# Patient Record
Sex: Female | Born: 1935 | Race: White | Hispanic: No | Smoking: Never smoker
Health system: Southern US, Community
[De-identification: ages and names within clinical notes are randomized; demographics above are authoritative.]

## PROBLEM LIST (undated history)

## (undated) DIAGNOSIS — I639 Cerebral infarction, unspecified: Secondary | ICD-10-CM

## (undated) DIAGNOSIS — M1990 Unspecified osteoarthritis, unspecified site: Secondary | ICD-10-CM

## (undated) DIAGNOSIS — Z87442 Personal history of urinary calculi: Secondary | ICD-10-CM

## (undated) HISTORY — PX: ABDOMINAL HYSTERECTOMY: SHX81

## (undated) HISTORY — PX: CHOLECYSTECTOMY: SHX55

---

## 2004-01-30 ENCOUNTER — Ambulatory Visit: Payer: Self-pay | Admitting: Family Medicine

## 2004-04-08 ENCOUNTER — Ambulatory Visit: Payer: Self-pay | Admitting: Family Medicine

## 2004-06-03 ENCOUNTER — Encounter: Admission: RE | Admit: 2004-06-03 | Discharge: 2004-06-03 | Payer: Self-pay | Admitting: Neurosurgery

## 2004-06-18 ENCOUNTER — Encounter: Admission: RE | Admit: 2004-06-18 | Discharge: 2004-06-18 | Payer: Self-pay | Admitting: Neurosurgery

## 2004-07-21 ENCOUNTER — Encounter: Admission: RE | Admit: 2004-07-21 | Discharge: 2004-07-21 | Payer: Self-pay | Admitting: Neurosurgery

## 2005-01-26 ENCOUNTER — Ambulatory Visit: Payer: Self-pay | Admitting: Family Medicine

## 2005-04-26 ENCOUNTER — Ambulatory Visit: Payer: Self-pay | Admitting: Family Medicine

## 2014-01-03 DIAGNOSIS — M79604 Pain in right leg: Secondary | ICD-10-CM | POA: Insufficient documentation

## 2014-03-24 DIAGNOSIS — M7138 Other bursal cyst, other site: Secondary | ICD-10-CM | POA: Diagnosis not present

## 2014-03-24 DIAGNOSIS — M4696 Unspecified inflammatory spondylopathy, lumbar region: Secondary | ICD-10-CM | POA: Diagnosis not present

## 2014-03-24 DIAGNOSIS — M419 Scoliosis, unspecified: Secondary | ICD-10-CM | POA: Diagnosis not present

## 2014-03-24 DIAGNOSIS — M79604 Pain in right leg: Secondary | ICD-10-CM | POA: Diagnosis not present

## 2014-03-31 DIAGNOSIS — D3131 Benign neoplasm of right choroid: Secondary | ICD-10-CM | POA: Diagnosis not present

## 2014-03-31 DIAGNOSIS — H40053 Ocular hypertension, bilateral: Secondary | ICD-10-CM | POA: Diagnosis not present

## 2014-03-31 DIAGNOSIS — H524 Presbyopia: Secondary | ICD-10-CM | POA: Diagnosis not present

## 2014-03-31 DIAGNOSIS — H40003 Preglaucoma, unspecified, bilateral: Secondary | ICD-10-CM | POA: Diagnosis not present

## 2014-04-16 DIAGNOSIS — Z6825 Body mass index (BMI) 25.0-25.9, adult: Secondary | ICD-10-CM | POA: Diagnosis not present

## 2014-04-16 DIAGNOSIS — Z0000 Encounter for general adult medical examination without abnormal findings: Secondary | ICD-10-CM | POA: Diagnosis not present

## 2014-04-18 DIAGNOSIS — Z1231 Encounter for screening mammogram for malignant neoplasm of breast: Secondary | ICD-10-CM | POA: Diagnosis not present

## 2014-04-18 DIAGNOSIS — Z13820 Encounter for screening for osteoporosis: Secondary | ICD-10-CM | POA: Diagnosis not present

## 2014-04-18 DIAGNOSIS — M8580 Other specified disorders of bone density and structure, unspecified site: Secondary | ICD-10-CM | POA: Diagnosis not present

## 2014-04-18 DIAGNOSIS — M810 Age-related osteoporosis without current pathological fracture: Secondary | ICD-10-CM | POA: Diagnosis not present

## 2014-04-28 DIAGNOSIS — Z1211 Encounter for screening for malignant neoplasm of colon: Secondary | ICD-10-CM | POA: Diagnosis not present

## 2014-06-05 DIAGNOSIS — M47817 Spondylosis without myelopathy or radiculopathy, lumbosacral region: Secondary | ICD-10-CM | POA: Diagnosis not present

## 2014-06-05 DIAGNOSIS — M4696 Unspecified inflammatory spondylopathy, lumbar region: Secondary | ICD-10-CM | POA: Diagnosis not present

## 2014-06-30 DIAGNOSIS — M4696 Unspecified inflammatory spondylopathy, lumbar region: Secondary | ICD-10-CM | POA: Diagnosis not present

## 2014-06-30 DIAGNOSIS — M4125 Other idiopathic scoliosis, thoracolumbar region: Secondary | ICD-10-CM | POA: Diagnosis not present

## 2014-06-30 DIAGNOSIS — M48062 Spinal stenosis, lumbar region with neurogenic claudication: Secondary | ICD-10-CM | POA: Insufficient documentation

## 2014-06-30 DIAGNOSIS — M4316 Spondylolisthesis, lumbar region: Secondary | ICD-10-CM | POA: Diagnosis not present

## 2014-06-30 DIAGNOSIS — M4806 Spinal stenosis, lumbar region: Secondary | ICD-10-CM | POA: Diagnosis not present

## 2014-09-29 DIAGNOSIS — Z6825 Body mass index (BMI) 25.0-25.9, adult: Secondary | ICD-10-CM | POA: Diagnosis not present

## 2014-09-29 DIAGNOSIS — J040 Acute laryngitis: Secondary | ICD-10-CM | POA: Diagnosis not present

## 2014-10-13 DIAGNOSIS — H4011X2 Primary open-angle glaucoma, moderate stage: Secondary | ICD-10-CM | POA: Diagnosis not present

## 2014-10-15 DIAGNOSIS — I10 Essential (primary) hypertension: Secondary | ICD-10-CM | POA: Diagnosis not present

## 2014-10-15 DIAGNOSIS — I739 Peripheral vascular disease, unspecified: Secondary | ICD-10-CM | POA: Diagnosis not present

## 2014-10-15 DIAGNOSIS — D62 Acute posthemorrhagic anemia: Secondary | ICD-10-CM | POA: Diagnosis not present

## 2014-10-15 DIAGNOSIS — N183 Chronic kidney disease, stage 3 (moderate): Secondary | ICD-10-CM | POA: Diagnosis not present

## 2014-10-15 DIAGNOSIS — E785 Hyperlipidemia, unspecified: Secondary | ICD-10-CM | POA: Diagnosis not present

## 2014-10-15 DIAGNOSIS — E039 Hypothyroidism, unspecified: Secondary | ICD-10-CM | POA: Diagnosis not present

## 2014-10-15 DIAGNOSIS — I252 Old myocardial infarction: Secondary | ICD-10-CM | POA: Diagnosis not present

## 2014-10-23 DIAGNOSIS — Z1211 Encounter for screening for malignant neoplasm of colon: Secondary | ICD-10-CM | POA: Diagnosis not present

## 2014-11-13 DIAGNOSIS — Z1211 Encounter for screening for malignant neoplasm of colon: Secondary | ICD-10-CM | POA: Diagnosis not present

## 2014-12-23 DIAGNOSIS — Z23 Encounter for immunization: Secondary | ICD-10-CM | POA: Diagnosis not present

## 2015-01-06 DIAGNOSIS — M4806 Spinal stenosis, lumbar region: Secondary | ICD-10-CM | POA: Diagnosis not present

## 2015-01-06 DIAGNOSIS — M5136 Other intervertebral disc degeneration, lumbar region: Secondary | ICD-10-CM | POA: Diagnosis not present

## 2015-01-06 DIAGNOSIS — M4150 Other secondary scoliosis, site unspecified: Secondary | ICD-10-CM | POA: Insufficient documentation

## 2015-01-06 DIAGNOSIS — M4180 Other forms of scoliosis, site unspecified: Secondary | ICD-10-CM

## 2015-01-06 DIAGNOSIS — M7138 Other bursal cyst, other site: Secondary | ICD-10-CM | POA: Diagnosis not present

## 2015-01-06 DIAGNOSIS — M4316 Spondylolisthesis, lumbar region: Secondary | ICD-10-CM | POA: Diagnosis not present

## 2015-01-06 HISTORY — DX: Other secondary scoliosis, site unspecified: M41.50

## 2015-01-06 HISTORY — DX: Other forms of scoliosis, site unspecified: M41.80

## 2015-01-19 DIAGNOSIS — M4856XA Collapsed vertebra, not elsewhere classified, lumbar region, initial encounter for fracture: Secondary | ICD-10-CM | POA: Diagnosis not present

## 2015-01-19 DIAGNOSIS — M5146 Schmorl's nodes, lumbar region: Secondary | ICD-10-CM | POA: Diagnosis not present

## 2015-01-19 DIAGNOSIS — E079 Disorder of thyroid, unspecified: Secondary | ICD-10-CM | POA: Diagnosis not present

## 2015-01-19 DIAGNOSIS — Z79899 Other long term (current) drug therapy: Secondary | ICD-10-CM | POA: Diagnosis not present

## 2015-01-19 DIAGNOSIS — I10 Essential (primary) hypertension: Secondary | ICD-10-CM | POA: Diagnosis not present

## 2015-01-19 DIAGNOSIS — M5124 Other intervertebral disc displacement, thoracic region: Secondary | ICD-10-CM | POA: Diagnosis not present

## 2015-01-19 DIAGNOSIS — M5136 Other intervertebral disc degeneration, lumbar region: Secondary | ICD-10-CM | POA: Diagnosis not present

## 2015-01-19 DIAGNOSIS — M4316 Spondylolisthesis, lumbar region: Secondary | ICD-10-CM | POA: Diagnosis not present

## 2015-01-19 DIAGNOSIS — M4126 Other idiopathic scoliosis, lumbar region: Secondary | ICD-10-CM | POA: Diagnosis not present

## 2015-01-19 DIAGNOSIS — M4806 Spinal stenosis, lumbar region: Secondary | ICD-10-CM | POA: Diagnosis not present

## 2015-01-19 DIAGNOSIS — M8580 Other specified disorders of bone density and structure, unspecified site: Secondary | ICD-10-CM | POA: Diagnosis not present

## 2015-01-19 DIAGNOSIS — M5126 Other intervertebral disc displacement, lumbar region: Secondary | ICD-10-CM | POA: Diagnosis not present

## 2015-02-02 DIAGNOSIS — H401132 Primary open-angle glaucoma, bilateral, moderate stage: Secondary | ICD-10-CM | POA: Diagnosis not present

## 2015-02-17 DIAGNOSIS — M4316 Spondylolisthesis, lumbar region: Secondary | ICD-10-CM | POA: Diagnosis not present

## 2015-02-17 DIAGNOSIS — M5136 Other intervertebral disc degeneration, lumbar region: Secondary | ICD-10-CM | POA: Diagnosis not present

## 2015-02-17 DIAGNOSIS — M4696 Unspecified inflammatory spondylopathy, lumbar region: Secondary | ICD-10-CM | POA: Diagnosis not present

## 2015-02-18 DIAGNOSIS — I10 Essential (primary) hypertension: Secondary | ICD-10-CM | POA: Diagnosis not present

## 2015-02-18 DIAGNOSIS — E785 Hyperlipidemia, unspecified: Secondary | ICD-10-CM | POA: Diagnosis not present

## 2015-02-18 DIAGNOSIS — E039 Hypothyroidism, unspecified: Secondary | ICD-10-CM | POA: Diagnosis not present

## 2015-02-18 DIAGNOSIS — E539 Vitamin B deficiency, unspecified: Secondary | ICD-10-CM | POA: Diagnosis not present

## 2015-02-18 DIAGNOSIS — Z6826 Body mass index (BMI) 26.0-26.9, adult: Secondary | ICD-10-CM | POA: Diagnosis not present

## 2015-02-27 DIAGNOSIS — B1910 Unspecified viral hepatitis B without hepatic coma: Secondary | ICD-10-CM | POA: Diagnosis not present

## 2015-02-27 DIAGNOSIS — Z6826 Body mass index (BMI) 26.0-26.9, adult: Secondary | ICD-10-CM | POA: Diagnosis not present

## 2015-03-02 DIAGNOSIS — H40053 Ocular hypertension, bilateral: Secondary | ICD-10-CM | POA: Diagnosis not present

## 2015-03-27 DIAGNOSIS — M47817 Spondylosis without myelopathy or radiculopathy, lumbosacral region: Secondary | ICD-10-CM | POA: Diagnosis not present

## 2015-03-27 DIAGNOSIS — M4316 Spondylolisthesis, lumbar region: Secondary | ICD-10-CM | POA: Diagnosis not present

## 2015-03-27 DIAGNOSIS — M5136 Other intervertebral disc degeneration, lumbar region: Secondary | ICD-10-CM | POA: Diagnosis not present

## 2015-04-16 DIAGNOSIS — M47817 Spondylosis without myelopathy or radiculopathy, lumbosacral region: Secondary | ICD-10-CM | POA: Diagnosis not present

## 2015-04-16 DIAGNOSIS — M5136 Other intervertebral disc degeneration, lumbar region: Secondary | ICD-10-CM | POA: Diagnosis not present

## 2015-04-16 DIAGNOSIS — M4316 Spondylolisthesis, lumbar region: Secondary | ICD-10-CM | POA: Diagnosis not present

## 2015-04-24 DIAGNOSIS — I10 Essential (primary) hypertension: Secondary | ICD-10-CM | POA: Diagnosis not present

## 2015-05-12 DIAGNOSIS — M7061 Trochanteric bursitis, right hip: Secondary | ICD-10-CM | POA: Diagnosis not present

## 2015-05-26 DIAGNOSIS — Z6825 Body mass index (BMI) 25.0-25.9, adult: Secondary | ICD-10-CM | POA: Diagnosis not present

## 2015-05-26 DIAGNOSIS — M7061 Trochanteric bursitis, right hip: Secondary | ICD-10-CM | POA: Diagnosis not present

## 2015-05-26 DIAGNOSIS — Z1231 Encounter for screening mammogram for malignant neoplasm of breast: Secondary | ICD-10-CM | POA: Diagnosis not present

## 2015-05-26 DIAGNOSIS — M5137 Other intervertebral disc degeneration, lumbosacral region: Secondary | ICD-10-CM | POA: Diagnosis not present

## 2015-06-01 DIAGNOSIS — M4696 Unspecified inflammatory spondylopathy, lumbar region: Secondary | ICD-10-CM | POA: Diagnosis not present

## 2015-06-01 DIAGNOSIS — M5136 Other intervertebral disc degeneration, lumbar region: Secondary | ICD-10-CM | POA: Diagnosis not present

## 2015-06-16 DIAGNOSIS — M47817 Spondylosis without myelopathy or radiculopathy, lumbosacral region: Secondary | ICD-10-CM | POA: Diagnosis not present

## 2015-06-29 DIAGNOSIS — S43102A Unspecified dislocation of left acromioclavicular joint, initial encounter: Secondary | ICD-10-CM | POA: Diagnosis not present

## 2015-06-29 DIAGNOSIS — S43015A Anterior dislocation of left humerus, initial encounter: Secondary | ICD-10-CM | POA: Diagnosis not present

## 2015-06-29 DIAGNOSIS — S43005A Unspecified dislocation of left shoulder joint, initial encounter: Secondary | ICD-10-CM | POA: Diagnosis not present

## 2015-07-02 DIAGNOSIS — S43005A Unspecified dislocation of left shoulder joint, initial encounter: Secondary | ICD-10-CM | POA: Diagnosis not present

## 2015-07-21 DIAGNOSIS — H524 Presbyopia: Secondary | ICD-10-CM | POA: Diagnosis not present

## 2015-07-21 DIAGNOSIS — H401132 Primary open-angle glaucoma, bilateral, moderate stage: Secondary | ICD-10-CM | POA: Diagnosis not present

## 2015-07-21 DIAGNOSIS — H26493 Other secondary cataract, bilateral: Secondary | ICD-10-CM | POA: Diagnosis not present

## 2015-08-13 DIAGNOSIS — E038 Other specified hypothyroidism: Secondary | ICD-10-CM | POA: Diagnosis not present

## 2015-08-13 DIAGNOSIS — I10 Essential (primary) hypertension: Secondary | ICD-10-CM | POA: Diagnosis not present

## 2015-08-13 DIAGNOSIS — E785 Hyperlipidemia, unspecified: Secondary | ICD-10-CM | POA: Diagnosis not present

## 2015-08-13 DIAGNOSIS — M5137 Other intervertebral disc degeneration, lumbosacral region: Secondary | ICD-10-CM | POA: Diagnosis not present

## 2015-08-13 DIAGNOSIS — M810 Age-related osteoporosis without current pathological fracture: Secondary | ICD-10-CM | POA: Diagnosis not present

## 2015-08-14 DIAGNOSIS — H401133 Primary open-angle glaucoma, bilateral, severe stage: Secondary | ICD-10-CM | POA: Diagnosis not present

## 2015-08-14 DIAGNOSIS — H26492 Other secondary cataract, left eye: Secondary | ICD-10-CM | POA: Diagnosis not present

## 2015-08-14 DIAGNOSIS — H26493 Other secondary cataract, bilateral: Secondary | ICD-10-CM | POA: Diagnosis not present

## 2015-08-14 DIAGNOSIS — H4010X0 Unspecified open-angle glaucoma, stage unspecified: Secondary | ICD-10-CM | POA: Diagnosis not present

## 2015-10-07 DIAGNOSIS — H33301 Unspecified retinal break, right eye: Secondary | ICD-10-CM | POA: Diagnosis not present

## 2015-10-07 DIAGNOSIS — D3131 Benign neoplasm of right choroid: Secondary | ICD-10-CM | POA: Diagnosis not present

## 2015-10-07 DIAGNOSIS — H401133 Primary open-angle glaucoma, bilateral, severe stage: Secondary | ICD-10-CM | POA: Diagnosis not present

## 2015-11-11 DIAGNOSIS — H401133 Primary open-angle glaucoma, bilateral, severe stage: Secondary | ICD-10-CM | POA: Diagnosis not present

## 2015-11-18 DIAGNOSIS — H401133 Primary open-angle glaucoma, bilateral, severe stage: Secondary | ICD-10-CM | POA: Diagnosis not present

## 2015-11-19 DIAGNOSIS — D3131 Benign neoplasm of right choroid: Secondary | ICD-10-CM | POA: Diagnosis not present

## 2016-01-11 DIAGNOSIS — Z23 Encounter for immunization: Secondary | ICD-10-CM | POA: Diagnosis not present

## 2016-02-10 DIAGNOSIS — H52202 Unspecified astigmatism, left eye: Secondary | ICD-10-CM | POA: Diagnosis not present

## 2016-02-10 DIAGNOSIS — H52201 Unspecified astigmatism, right eye: Secondary | ICD-10-CM | POA: Diagnosis not present

## 2016-02-10 DIAGNOSIS — H5211 Myopia, right eye: Secondary | ICD-10-CM | POA: Diagnosis not present

## 2016-02-10 DIAGNOSIS — H5212 Myopia, left eye: Secondary | ICD-10-CM | POA: Diagnosis not present

## 2016-02-15 DIAGNOSIS — I10 Essential (primary) hypertension: Secondary | ICD-10-CM | POA: Diagnosis not present

## 2016-02-15 DIAGNOSIS — E039 Hypothyroidism, unspecified: Secondary | ICD-10-CM | POA: Diagnosis not present

## 2016-02-15 DIAGNOSIS — H4010X0 Unspecified open-angle glaucoma, stage unspecified: Secondary | ICD-10-CM | POA: Diagnosis not present

## 2016-02-15 DIAGNOSIS — M5137 Other intervertebral disc degeneration, lumbosacral region: Secondary | ICD-10-CM | POA: Diagnosis not present

## 2016-02-15 DIAGNOSIS — E785 Hyperlipidemia, unspecified: Secondary | ICD-10-CM | POA: Diagnosis not present

## 2016-02-15 DIAGNOSIS — M810 Age-related osteoporosis without current pathological fracture: Secondary | ICD-10-CM | POA: Diagnosis not present

## 2016-07-27 DIAGNOSIS — Z09 Encounter for follow-up examination after completed treatment for conditions other than malignant neoplasm: Secondary | ICD-10-CM | POA: Diagnosis not present

## 2016-07-27 DIAGNOSIS — H401133 Primary open-angle glaucoma, bilateral, severe stage: Secondary | ICD-10-CM | POA: Diagnosis not present

## 2016-08-08 DIAGNOSIS — I739 Peripheral vascular disease, unspecified: Secondary | ICD-10-CM | POA: Diagnosis not present

## 2016-08-08 DIAGNOSIS — E782 Mixed hyperlipidemia: Secondary | ICD-10-CM | POA: Diagnosis not present

## 2016-08-08 DIAGNOSIS — I10 Essential (primary) hypertension: Secondary | ICD-10-CM | POA: Diagnosis not present

## 2016-08-08 DIAGNOSIS — H4010X0 Unspecified open-angle glaucoma, stage unspecified: Secondary | ICD-10-CM | POA: Diagnosis not present

## 2016-08-08 DIAGNOSIS — E038 Other specified hypothyroidism: Secondary | ICD-10-CM | POA: Diagnosis not present

## 2016-08-26 ENCOUNTER — Other Ambulatory Visit: Payer: Self-pay | Admitting: *Deleted

## 2016-08-26 DIAGNOSIS — I739 Peripheral vascular disease, unspecified: Secondary | ICD-10-CM

## 2016-09-15 DIAGNOSIS — Z01818 Encounter for other preprocedural examination: Secondary | ICD-10-CM | POA: Diagnosis not present

## 2016-09-15 DIAGNOSIS — H401133 Primary open-angle glaucoma, bilateral, severe stage: Secondary | ICD-10-CM | POA: Diagnosis not present

## 2016-10-05 DIAGNOSIS — H401113 Primary open-angle glaucoma, right eye, severe stage: Secondary | ICD-10-CM | POA: Diagnosis not present

## 2016-10-05 DIAGNOSIS — H5941 Inflammation (infection) of postprocedural bleb, stage 1: Secondary | ICD-10-CM | POA: Diagnosis not present

## 2016-10-05 DIAGNOSIS — H578 Other specified disorders of eye and adnexa: Secondary | ICD-10-CM | POA: Diagnosis not present

## 2016-10-21 ENCOUNTER — Encounter (HOSPITAL_COMMUNITY): Payer: Self-pay

## 2016-10-21 ENCOUNTER — Encounter: Payer: Self-pay | Admitting: Vascular Surgery

## 2016-11-09 DIAGNOSIS — H401123 Primary open-angle glaucoma, left eye, severe stage: Secondary | ICD-10-CM | POA: Diagnosis not present

## 2016-11-09 DIAGNOSIS — H409 Unspecified glaucoma: Secondary | ICD-10-CM | POA: Diagnosis not present

## 2016-12-09 ENCOUNTER — Encounter (HOSPITAL_COMMUNITY): Payer: Self-pay

## 2016-12-09 ENCOUNTER — Encounter: Payer: Self-pay | Admitting: Vascular Surgery

## 2017-01-20 ENCOUNTER — Encounter (HOSPITAL_COMMUNITY): Payer: Self-pay

## 2017-01-20 ENCOUNTER — Encounter: Payer: Self-pay | Admitting: Vascular Surgery

## 2017-02-17 DIAGNOSIS — H401133 Primary open-angle glaucoma, bilateral, severe stage: Secondary | ICD-10-CM | POA: Diagnosis not present

## 2017-02-20 DIAGNOSIS — I739 Peripheral vascular disease, unspecified: Secondary | ICD-10-CM | POA: Diagnosis not present

## 2017-02-20 DIAGNOSIS — I10 Essential (primary) hypertension: Secondary | ICD-10-CM | POA: Insufficient documentation

## 2017-02-20 DIAGNOSIS — R0989 Other specified symptoms and signs involving the circulatory and respiratory systems: Secondary | ICD-10-CM | POA: Insufficient documentation

## 2017-02-20 DIAGNOSIS — H409 Unspecified glaucoma: Secondary | ICD-10-CM

## 2017-02-20 HISTORY — DX: Essential (primary) hypertension: I10

## 2017-02-20 HISTORY — DX: Unspecified glaucoma: H40.9

## 2017-04-10 DIAGNOSIS — J0100 Acute maxillary sinusitis, unspecified: Secondary | ICD-10-CM | POA: Diagnosis not present

## 2017-04-25 DIAGNOSIS — I10 Essential (primary) hypertension: Secondary | ICD-10-CM | POA: Diagnosis not present

## 2017-04-25 DIAGNOSIS — I739 Peripheral vascular disease, unspecified: Secondary | ICD-10-CM | POA: Diagnosis not present

## 2017-04-25 DIAGNOSIS — E038 Other specified hypothyroidism: Secondary | ICD-10-CM | POA: Diagnosis not present

## 2017-04-25 DIAGNOSIS — E782 Mixed hyperlipidemia: Secondary | ICD-10-CM | POA: Diagnosis not present

## 2017-04-25 DIAGNOSIS — H4010X0 Unspecified open-angle glaucoma, stage unspecified: Secondary | ICD-10-CM | POA: Diagnosis not present

## 2017-05-19 DIAGNOSIS — H401133 Primary open-angle glaucoma, bilateral, severe stage: Secondary | ICD-10-CM | POA: Diagnosis not present

## 2017-05-19 DIAGNOSIS — H401122 Primary open-angle glaucoma, left eye, moderate stage: Secondary | ICD-10-CM | POA: Diagnosis not present

## 2017-05-19 DIAGNOSIS — H04122 Dry eye syndrome of left lacrimal gland: Secondary | ICD-10-CM | POA: Diagnosis not present

## 2017-05-19 DIAGNOSIS — H04121 Dry eye syndrome of right lacrimal gland: Secondary | ICD-10-CM | POA: Diagnosis not present

## 2017-06-30 DIAGNOSIS — I6350 Cerebral infarction due to unspecified occlusion or stenosis of unspecified cerebral artery: Secondary | ICD-10-CM | POA: Diagnosis not present

## 2017-06-30 DIAGNOSIS — R29810 Facial weakness: Secondary | ICD-10-CM | POA: Diagnosis not present

## 2017-06-30 DIAGNOSIS — I639 Cerebral infarction, unspecified: Secondary | ICD-10-CM | POA: Diagnosis not present

## 2017-06-30 DIAGNOSIS — I6521 Occlusion and stenosis of right carotid artery: Secondary | ICD-10-CM | POA: Diagnosis not present

## 2017-06-30 DIAGNOSIS — G8324 Monoplegia of upper limb affecting left nondominant side: Secondary | ICD-10-CM | POA: Diagnosis not present

## 2017-06-30 DIAGNOSIS — R29701 NIHSS score 1: Secondary | ICD-10-CM | POA: Diagnosis not present

## 2017-06-30 DIAGNOSIS — R51 Headache: Secondary | ICD-10-CM | POA: Diagnosis not present

## 2017-06-30 DIAGNOSIS — M1990 Unspecified osteoarthritis, unspecified site: Secondary | ICD-10-CM | POA: Diagnosis not present

## 2017-06-30 DIAGNOSIS — K219 Gastro-esophageal reflux disease without esophagitis: Secondary | ICD-10-CM | POA: Diagnosis not present

## 2017-06-30 DIAGNOSIS — I10 Essential (primary) hypertension: Secondary | ICD-10-CM | POA: Diagnosis not present

## 2017-06-30 DIAGNOSIS — I63331 Cerebral infarction due to thrombosis of right posterior cerebral artery: Secondary | ICD-10-CM | POA: Diagnosis not present

## 2017-06-30 DIAGNOSIS — R42 Dizziness and giddiness: Secondary | ICD-10-CM | POA: Diagnosis not present

## 2017-06-30 DIAGNOSIS — E039 Hypothyroidism, unspecified: Secondary | ICD-10-CM | POA: Diagnosis not present

## 2017-06-30 DIAGNOSIS — R29700 NIHSS score 0: Secondary | ICD-10-CM | POA: Diagnosis not present

## 2017-06-30 DIAGNOSIS — E785 Hyperlipidemia, unspecified: Secondary | ICD-10-CM | POA: Diagnosis not present

## 2017-06-30 DIAGNOSIS — R202 Paresthesia of skin: Secondary | ICD-10-CM | POA: Diagnosis not present

## 2017-06-30 DIAGNOSIS — R079 Chest pain, unspecified: Secondary | ICD-10-CM | POA: Diagnosis not present

## 2017-06-30 DIAGNOSIS — H409 Unspecified glaucoma: Secondary | ICD-10-CM | POA: Diagnosis not present

## 2017-06-30 DIAGNOSIS — I6789 Other cerebrovascular disease: Secondary | ICD-10-CM | POA: Diagnosis not present

## 2017-06-30 DIAGNOSIS — Z885 Allergy status to narcotic agent status: Secondary | ICD-10-CM | POA: Diagnosis not present

## 2017-06-30 DIAGNOSIS — Z79899 Other long term (current) drug therapy: Secondary | ICD-10-CM | POA: Diagnosis not present

## 2017-07-01 DIAGNOSIS — I639 Cerebral infarction, unspecified: Secondary | ICD-10-CM | POA: Diagnosis not present

## 2017-07-01 DIAGNOSIS — I6789 Other cerebrovascular disease: Secondary | ICD-10-CM | POA: Diagnosis not present

## 2017-07-01 DIAGNOSIS — R29810 Facial weakness: Secondary | ICD-10-CM | POA: Diagnosis not present

## 2017-07-01 DIAGNOSIS — R42 Dizziness and giddiness: Secondary | ICD-10-CM | POA: Diagnosis not present

## 2017-07-05 DIAGNOSIS — M1991 Primary osteoarthritis, unspecified site: Secondary | ICD-10-CM | POA: Diagnosis not present

## 2017-07-05 DIAGNOSIS — Z9181 History of falling: Secondary | ICD-10-CM | POA: Diagnosis not present

## 2017-07-05 DIAGNOSIS — I69398 Other sequelae of cerebral infarction: Secondary | ICD-10-CM | POA: Diagnosis not present

## 2017-07-05 DIAGNOSIS — E039 Hypothyroidism, unspecified: Secondary | ICD-10-CM | POA: Diagnosis not present

## 2017-07-05 DIAGNOSIS — H409 Unspecified glaucoma: Secondary | ICD-10-CM | POA: Diagnosis not present

## 2017-07-05 DIAGNOSIS — R531 Weakness: Secondary | ICD-10-CM | POA: Diagnosis not present

## 2017-07-05 DIAGNOSIS — Z7982 Long term (current) use of aspirin: Secondary | ICD-10-CM | POA: Diagnosis not present

## 2017-07-05 DIAGNOSIS — I10 Essential (primary) hypertension: Secondary | ICD-10-CM | POA: Diagnosis not present

## 2017-07-05 DIAGNOSIS — H5340 Unspecified visual field defects: Secondary | ICD-10-CM | POA: Diagnosis not present

## 2017-07-05 DIAGNOSIS — D649 Anemia, unspecified: Secondary | ICD-10-CM | POA: Diagnosis not present

## 2017-07-05 DIAGNOSIS — K219 Gastro-esophageal reflux disease without esophagitis: Secondary | ICD-10-CM | POA: Diagnosis not present

## 2017-07-06 DIAGNOSIS — I6990 Unspecified sequelae of unspecified cerebrovascular disease: Secondary | ICD-10-CM | POA: Diagnosis not present

## 2017-07-11 DIAGNOSIS — E039 Hypothyroidism, unspecified: Secondary | ICD-10-CM | POA: Diagnosis not present

## 2017-07-11 DIAGNOSIS — H409 Unspecified glaucoma: Secondary | ICD-10-CM | POA: Diagnosis not present

## 2017-07-11 DIAGNOSIS — Z9181 History of falling: Secondary | ICD-10-CM | POA: Diagnosis not present

## 2017-07-11 DIAGNOSIS — R531 Weakness: Secondary | ICD-10-CM | POA: Diagnosis not present

## 2017-07-11 DIAGNOSIS — Z7982 Long term (current) use of aspirin: Secondary | ICD-10-CM | POA: Diagnosis not present

## 2017-07-11 DIAGNOSIS — I10 Essential (primary) hypertension: Secondary | ICD-10-CM | POA: Diagnosis not present

## 2017-07-11 DIAGNOSIS — H5340 Unspecified visual field defects: Secondary | ICD-10-CM | POA: Diagnosis not present

## 2017-07-11 DIAGNOSIS — I69398 Other sequelae of cerebral infarction: Secondary | ICD-10-CM | POA: Diagnosis not present

## 2017-07-11 DIAGNOSIS — D649 Anemia, unspecified: Secondary | ICD-10-CM | POA: Diagnosis not present

## 2017-07-11 DIAGNOSIS — M1991 Primary osteoarthritis, unspecified site: Secondary | ICD-10-CM | POA: Diagnosis not present

## 2017-07-11 DIAGNOSIS — K219 Gastro-esophageal reflux disease without esophagitis: Secondary | ICD-10-CM | POA: Diagnosis not present

## 2017-07-13 DIAGNOSIS — I69398 Other sequelae of cerebral infarction: Secondary | ICD-10-CM | POA: Diagnosis not present

## 2017-07-13 DIAGNOSIS — H5340 Unspecified visual field defects: Secondary | ICD-10-CM | POA: Diagnosis not present

## 2017-07-13 DIAGNOSIS — I10 Essential (primary) hypertension: Secondary | ICD-10-CM | POA: Diagnosis not present

## 2017-07-13 DIAGNOSIS — M1991 Primary osteoarthritis, unspecified site: Secondary | ICD-10-CM | POA: Diagnosis not present

## 2017-07-13 DIAGNOSIS — Z9181 History of falling: Secondary | ICD-10-CM | POA: Diagnosis not present

## 2017-07-13 DIAGNOSIS — D649 Anemia, unspecified: Secondary | ICD-10-CM | POA: Diagnosis not present

## 2017-07-13 DIAGNOSIS — R531 Weakness: Secondary | ICD-10-CM | POA: Diagnosis not present

## 2017-07-13 DIAGNOSIS — H409 Unspecified glaucoma: Secondary | ICD-10-CM | POA: Diagnosis not present

## 2017-07-13 DIAGNOSIS — Z7982 Long term (current) use of aspirin: Secondary | ICD-10-CM | POA: Diagnosis not present

## 2017-07-13 DIAGNOSIS — E039 Hypothyroidism, unspecified: Secondary | ICD-10-CM | POA: Diagnosis not present

## 2017-07-13 DIAGNOSIS — K219 Gastro-esophageal reflux disease without esophagitis: Secondary | ICD-10-CM | POA: Diagnosis not present

## 2017-07-17 DIAGNOSIS — I69398 Other sequelae of cerebral infarction: Secondary | ICD-10-CM | POA: Diagnosis not present

## 2017-07-17 DIAGNOSIS — Z7982 Long term (current) use of aspirin: Secondary | ICD-10-CM | POA: Diagnosis not present

## 2017-07-17 DIAGNOSIS — H409 Unspecified glaucoma: Secondary | ICD-10-CM | POA: Diagnosis not present

## 2017-07-17 DIAGNOSIS — E039 Hypothyroidism, unspecified: Secondary | ICD-10-CM | POA: Diagnosis not present

## 2017-07-17 DIAGNOSIS — D649 Anemia, unspecified: Secondary | ICD-10-CM | POA: Diagnosis not present

## 2017-07-17 DIAGNOSIS — I10 Essential (primary) hypertension: Secondary | ICD-10-CM | POA: Diagnosis not present

## 2017-07-17 DIAGNOSIS — Z9181 History of falling: Secondary | ICD-10-CM | POA: Diagnosis not present

## 2017-07-17 DIAGNOSIS — K219 Gastro-esophageal reflux disease without esophagitis: Secondary | ICD-10-CM | POA: Diagnosis not present

## 2017-07-17 DIAGNOSIS — H5340 Unspecified visual field defects: Secondary | ICD-10-CM | POA: Diagnosis not present

## 2017-07-17 DIAGNOSIS — R531 Weakness: Secondary | ICD-10-CM | POA: Diagnosis not present

## 2017-07-17 DIAGNOSIS — M1991 Primary osteoarthritis, unspecified site: Secondary | ICD-10-CM | POA: Diagnosis not present

## 2017-07-19 DIAGNOSIS — H401133 Primary open-angle glaucoma, bilateral, severe stage: Secondary | ICD-10-CM | POA: Diagnosis not present

## 2017-07-20 DIAGNOSIS — I69398 Other sequelae of cerebral infarction: Secondary | ICD-10-CM | POA: Diagnosis not present

## 2017-07-20 DIAGNOSIS — D649 Anemia, unspecified: Secondary | ICD-10-CM | POA: Diagnosis not present

## 2017-07-20 DIAGNOSIS — Z7982 Long term (current) use of aspirin: Secondary | ICD-10-CM | POA: Diagnosis not present

## 2017-07-20 DIAGNOSIS — H5340 Unspecified visual field defects: Secondary | ICD-10-CM | POA: Diagnosis not present

## 2017-07-20 DIAGNOSIS — M1991 Primary osteoarthritis, unspecified site: Secondary | ICD-10-CM | POA: Diagnosis not present

## 2017-07-20 DIAGNOSIS — H409 Unspecified glaucoma: Secondary | ICD-10-CM | POA: Diagnosis not present

## 2017-07-20 DIAGNOSIS — Z9181 History of falling: Secondary | ICD-10-CM | POA: Diagnosis not present

## 2017-07-20 DIAGNOSIS — R531 Weakness: Secondary | ICD-10-CM | POA: Diagnosis not present

## 2017-07-20 DIAGNOSIS — K219 Gastro-esophageal reflux disease without esophagitis: Secondary | ICD-10-CM | POA: Diagnosis not present

## 2017-07-20 DIAGNOSIS — I10 Essential (primary) hypertension: Secondary | ICD-10-CM | POA: Diagnosis not present

## 2017-07-20 DIAGNOSIS — E039 Hypothyroidism, unspecified: Secondary | ICD-10-CM | POA: Diagnosis not present

## 2017-07-24 DIAGNOSIS — R531 Weakness: Secondary | ICD-10-CM | POA: Diagnosis not present

## 2017-07-24 DIAGNOSIS — I10 Essential (primary) hypertension: Secondary | ICD-10-CM | POA: Diagnosis not present

## 2017-07-24 DIAGNOSIS — D649 Anemia, unspecified: Secondary | ICD-10-CM | POA: Diagnosis not present

## 2017-07-24 DIAGNOSIS — E039 Hypothyroidism, unspecified: Secondary | ICD-10-CM | POA: Diagnosis not present

## 2017-07-24 DIAGNOSIS — M1991 Primary osteoarthritis, unspecified site: Secondary | ICD-10-CM | POA: Diagnosis not present

## 2017-07-24 DIAGNOSIS — H409 Unspecified glaucoma: Secondary | ICD-10-CM | POA: Diagnosis not present

## 2017-07-24 DIAGNOSIS — I69398 Other sequelae of cerebral infarction: Secondary | ICD-10-CM | POA: Diagnosis not present

## 2017-07-24 DIAGNOSIS — Z9181 History of falling: Secondary | ICD-10-CM | POA: Diagnosis not present

## 2017-07-24 DIAGNOSIS — H5340 Unspecified visual field defects: Secondary | ICD-10-CM | POA: Diagnosis not present

## 2017-07-24 DIAGNOSIS — Z7982 Long term (current) use of aspirin: Secondary | ICD-10-CM | POA: Diagnosis not present

## 2017-07-24 DIAGNOSIS — K219 Gastro-esophageal reflux disease without esophagitis: Secondary | ICD-10-CM | POA: Diagnosis not present

## 2017-07-26 DIAGNOSIS — R531 Weakness: Secondary | ICD-10-CM | POA: Diagnosis not present

## 2017-07-26 DIAGNOSIS — Z7982 Long term (current) use of aspirin: Secondary | ICD-10-CM | POA: Diagnosis not present

## 2017-07-26 DIAGNOSIS — I10 Essential (primary) hypertension: Secondary | ICD-10-CM | POA: Diagnosis not present

## 2017-07-26 DIAGNOSIS — Z9181 History of falling: Secondary | ICD-10-CM | POA: Diagnosis not present

## 2017-07-26 DIAGNOSIS — H5340 Unspecified visual field defects: Secondary | ICD-10-CM | POA: Diagnosis not present

## 2017-07-26 DIAGNOSIS — M1991 Primary osteoarthritis, unspecified site: Secondary | ICD-10-CM | POA: Diagnosis not present

## 2017-07-26 DIAGNOSIS — K219 Gastro-esophageal reflux disease without esophagitis: Secondary | ICD-10-CM | POA: Diagnosis not present

## 2017-07-26 DIAGNOSIS — D649 Anemia, unspecified: Secondary | ICD-10-CM | POA: Diagnosis not present

## 2017-07-26 DIAGNOSIS — I69398 Other sequelae of cerebral infarction: Secondary | ICD-10-CM | POA: Diagnosis not present

## 2017-07-26 DIAGNOSIS — E039 Hypothyroidism, unspecified: Secondary | ICD-10-CM | POA: Diagnosis not present

## 2017-07-26 DIAGNOSIS — H409 Unspecified glaucoma: Secondary | ICD-10-CM | POA: Diagnosis not present

## 2017-08-23 DIAGNOSIS — I10 Essential (primary) hypertension: Secondary | ICD-10-CM | POA: Diagnosis not present

## 2017-08-23 DIAGNOSIS — H4010X0 Unspecified open-angle glaucoma, stage unspecified: Secondary | ICD-10-CM | POA: Diagnosis not present

## 2017-08-23 DIAGNOSIS — E782 Mixed hyperlipidemia: Secondary | ICD-10-CM | POA: Diagnosis not present

## 2017-08-23 DIAGNOSIS — I6930 Unspecified sequelae of cerebral infarction: Secondary | ICD-10-CM | POA: Diagnosis not present

## 2017-08-23 DIAGNOSIS — E038 Other specified hypothyroidism: Secondary | ICD-10-CM | POA: Diagnosis not present

## 2017-11-09 DIAGNOSIS — H401133 Primary open-angle glaucoma, bilateral, severe stage: Secondary | ICD-10-CM | POA: Diagnosis not present

## 2017-12-07 DIAGNOSIS — I6930 Unspecified sequelae of cerebral infarction: Secondary | ICD-10-CM | POA: Diagnosis not present

## 2017-12-11 DIAGNOSIS — L03011 Cellulitis of right finger: Secondary | ICD-10-CM | POA: Diagnosis not present

## 2017-12-11 DIAGNOSIS — Z23 Encounter for immunization: Secondary | ICD-10-CM | POA: Diagnosis not present

## 2018-01-12 DIAGNOSIS — S93104S Unspecified dislocation of right toe(s), sequela: Secondary | ICD-10-CM | POA: Diagnosis not present

## 2018-01-12 DIAGNOSIS — M2061 Acquired deformities of toe(s), unspecified, right foot: Secondary | ICD-10-CM | POA: Diagnosis not present

## 2018-01-18 DIAGNOSIS — E782 Mixed hyperlipidemia: Secondary | ICD-10-CM | POA: Diagnosis not present

## 2018-01-18 DIAGNOSIS — E038 Other specified hypothyroidism: Secondary | ICD-10-CM | POA: Diagnosis not present

## 2018-01-18 DIAGNOSIS — I10 Essential (primary) hypertension: Secondary | ICD-10-CM | POA: Diagnosis not present

## 2018-01-18 DIAGNOSIS — M2041 Other hammer toe(s) (acquired), right foot: Secondary | ICD-10-CM | POA: Diagnosis not present

## 2018-01-18 DIAGNOSIS — K219 Gastro-esophageal reflux disease without esophagitis: Secondary | ICD-10-CM | POA: Diagnosis not present

## 2018-02-19 DIAGNOSIS — M2061 Acquired deformities of toe(s), unspecified, right foot: Secondary | ICD-10-CM | POA: Diagnosis not present

## 2018-02-27 DIAGNOSIS — M205X1 Other deformities of toe(s) (acquired), right foot: Secondary | ICD-10-CM | POA: Diagnosis not present

## 2018-02-27 DIAGNOSIS — M2061 Acquired deformities of toe(s), unspecified, right foot: Secondary | ICD-10-CM | POA: Diagnosis not present

## 2018-02-28 DIAGNOSIS — Z89421 Acquired absence of other right toe(s): Secondary | ICD-10-CM | POA: Insufficient documentation

## 2018-03-08 DIAGNOSIS — H401133 Primary open-angle glaucoma, bilateral, severe stage: Secondary | ICD-10-CM | POA: Diagnosis not present

## 2018-03-29 DIAGNOSIS — H401133 Primary open-angle glaucoma, bilateral, severe stage: Secondary | ICD-10-CM | POA: Diagnosis not present

## 2018-05-21 DIAGNOSIS — I10 Essential (primary) hypertension: Secondary | ICD-10-CM | POA: Diagnosis not present

## 2018-05-21 DIAGNOSIS — K219 Gastro-esophageal reflux disease without esophagitis: Secondary | ICD-10-CM | POA: Diagnosis not present

## 2018-05-21 DIAGNOSIS — E782 Mixed hyperlipidemia: Secondary | ICD-10-CM | POA: Diagnosis not present

## 2018-05-21 DIAGNOSIS — E038 Other specified hypothyroidism: Secondary | ICD-10-CM | POA: Diagnosis not present

## 2018-05-21 DIAGNOSIS — N183 Chronic kidney disease, stage 3 (moderate): Secondary | ICD-10-CM | POA: Diagnosis not present

## 2018-06-07 DIAGNOSIS — H401133 Primary open-angle glaucoma, bilateral, severe stage: Secondary | ICD-10-CM | POA: Diagnosis not present

## 2018-09-06 DIAGNOSIS — H401133 Primary open-angle glaucoma, bilateral, severe stage: Secondary | ICD-10-CM | POA: Diagnosis not present

## 2018-09-11 ENCOUNTER — Encounter: Payer: Self-pay | Admitting: Legal Medicine

## 2018-09-19 DIAGNOSIS — M810 Age-related osteoporosis without current pathological fracture: Secondary | ICD-10-CM | POA: Diagnosis not present

## 2018-09-19 DIAGNOSIS — Z1231 Encounter for screening mammogram for malignant neoplasm of breast: Secondary | ICD-10-CM | POA: Diagnosis not present

## 2018-09-19 DIAGNOSIS — M8589 Other specified disorders of bone density and structure, multiple sites: Secondary | ICD-10-CM | POA: Diagnosis not present

## 2018-11-28 DIAGNOSIS — I739 Peripheral vascular disease, unspecified: Secondary | ICD-10-CM | POA: Diagnosis not present

## 2018-11-28 DIAGNOSIS — E782 Mixed hyperlipidemia: Secondary | ICD-10-CM | POA: Diagnosis not present

## 2018-11-28 DIAGNOSIS — E038 Other specified hypothyroidism: Secondary | ICD-10-CM | POA: Diagnosis not present

## 2018-11-28 DIAGNOSIS — I10 Essential (primary) hypertension: Secondary | ICD-10-CM | POA: Diagnosis not present

## 2018-12-03 DIAGNOSIS — Z0000 Encounter for general adult medical examination without abnormal findings: Secondary | ICD-10-CM | POA: Diagnosis not present

## 2018-12-07 DIAGNOSIS — H401133 Primary open-angle glaucoma, bilateral, severe stage: Secondary | ICD-10-CM | POA: Diagnosis not present

## 2019-01-11 DIAGNOSIS — Z23 Encounter for immunization: Secondary | ICD-10-CM | POA: Diagnosis not present

## 2019-03-08 DIAGNOSIS — H401133 Primary open-angle glaucoma, bilateral, severe stage: Secondary | ICD-10-CM | POA: Diagnosis not present

## 2019-04-10 DIAGNOSIS — H401133 Primary open-angle glaucoma, bilateral, severe stage: Secondary | ICD-10-CM | POA: Diagnosis not present

## 2019-05-06 ENCOUNTER — Telehealth: Payer: Self-pay

## 2019-05-06 ENCOUNTER — Other Ambulatory Visit: Payer: Self-pay

## 2019-05-06 DIAGNOSIS — L03818 Cellulitis of other sites: Secondary | ICD-10-CM

## 2019-05-06 MED ORDER — MUPIROCIN 2 % EX OINT: 1.0000 "application " | TOPICAL_OINTMENT | Freq: Two times a day (BID) | CUTANEOUS | 0 refills | Status: DC

## 2019-05-06 NOTE — Telephone Encounter (Signed)
Call in [place] to use bid until healed lp

## 2019-05-06 NOTE — Telephone Encounter (Signed)
Patient scratched her leg and it very red. They asked if you want to send something to the pharmacy or do you want a office visit?

## 2019-05-06 NOTE — Telephone Encounter (Signed)
Bactroban was sent to [facility] in [place]. Patient was informed.

## 2019-05-08 ENCOUNTER — Ambulatory Visit: Payer: Self-pay | Admitting: Legal Medicine

## 2019-05-28 ENCOUNTER — Other Ambulatory Visit: Payer: Self-pay

## 2019-05-28 ENCOUNTER — Ambulatory Visit (INDEPENDENT_AMBULATORY_CARE_PROVIDER_SITE_OTHER): Payer: Medicare Other | Admitting: Legal Medicine

## 2019-05-28 ENCOUNTER — Encounter: Payer: Self-pay | Admitting: Legal Medicine

## 2019-05-28 VITALS — BP 140/72 | HR 74 | Temp 97.1°F | Resp 17 | Ht 62.0 in | Wt 147.4 lb

## 2019-05-28 DIAGNOSIS — E039 Hypothyroidism, unspecified: Secondary | ICD-10-CM

## 2019-05-28 DIAGNOSIS — K2100 Gastro-esophageal reflux disease with esophagitis, without bleeding: Secondary | ICD-10-CM

## 2019-05-28 DIAGNOSIS — D631 Anemia in chronic kidney disease: Secondary | ICD-10-CM | POA: Insufficient documentation

## 2019-05-28 DIAGNOSIS — I6930 Unspecified sequelae of cerebral infarction: Secondary | ICD-10-CM

## 2019-05-28 DIAGNOSIS — I739 Peripheral vascular disease, unspecified: Secondary | ICD-10-CM | POA: Insufficient documentation

## 2019-05-28 DIAGNOSIS — E782 Mixed hyperlipidemia: Secondary | ICD-10-CM

## 2019-05-28 DIAGNOSIS — F4321 Adjustment disorder with depressed mood: Secondary | ICD-10-CM | POA: Diagnosis not present

## 2019-05-28 DIAGNOSIS — M810 Age-related osteoporosis without current pathological fracture: Secondary | ICD-10-CM

## 2019-05-28 DIAGNOSIS — N1832 Chronic kidney disease, stage 3b: Secondary | ICD-10-CM | POA: Insufficient documentation

## 2019-05-28 DIAGNOSIS — I10 Essential (primary) hypertension: Secondary | ICD-10-CM | POA: Diagnosis not present

## 2019-05-28 HISTORY — DX: Gastro-esophageal reflux disease with esophagitis, without bleeding: K21.00

## 2019-05-28 HISTORY — DX: Hypothyroidism, unspecified: E03.9

## 2019-05-28 HISTORY — DX: Age-related osteoporosis without current pathological fracture: M81.0

## 2019-05-28 HISTORY — DX: Peripheral vascular disease, unspecified: I73.9

## 2019-05-28 HISTORY — DX: Anemia in chronic kidney disease: D63.1

## 2019-05-28 HISTORY — DX: Unspecified sequelae of cerebral infarction: I69.30

## 2019-05-28 HISTORY — DX: Mixed hyperlipidemia: E78.2

## 2019-05-28 MED ORDER — SILVER SULFADIAZINE 1 % EX CREA: 1.0000 "application " | TOPICAL_CREAM | Freq: Every day | CUTANEOUS | 2 refills | Status: DC

## 2019-05-28 NOTE — Assessment & Plan Note (Signed)
AN INDIVIDUAL CARE PLAN was established and reinforced today.  The patient's status was assessed using clinical findings on exam, labs, and other diagnostic testing. Patient's success at meeting treatment goals based on disease specific evidence-bassed guidelines and found to be in fair control. RECOMMENDATIONS include mantaining present medicines and treatment. She is having still some dysphasia.

## 2019-05-28 NOTE — Assessment & Plan Note (Signed)
AN INDIVIDUAL CARE PLAN was established and reinforced today.  The patient's status was assessed using clinical findings on exam, labs, and other diagnostic testing. Patient's success at meeting treatment goals based on disease specific evidence-bassed guidelines and found to be in good control. RECOMMENDATIONS include maintaining present medicines and treatment. 

## 2019-05-28 NOTE — Assessment & Plan Note (Signed)
Patient's depression is controlled with no medicines.   Anhedonia better.  PHQ 9 wasperformed, no performed} score 3. An individual care plan was established or reinforced today.  The patient's disease status was assessed using clinical findings on exam, labs, and or other diagnostic testing to determine patient's success in meeting treatment goals based on disease specific evidence-based guidelines and found to be improving Recommendations include no need for medicines

## 2019-05-28 NOTE — Assessment & Plan Note (Signed)
Plan of care was formulated today.  She is doing well.  A plan of care was formulated using patient exam, tests and other sources to optimize care using evidence based information.  Recommend no smoking, no eating after supper, avoid fatty foods, elevate Head of bed, avoid tight fitting clothing.  Continue on medicines.Marland Kitchen

## 2019-05-28 NOTE — Assessment & Plan Note (Signed)

## 2019-05-28 NOTE — Progress Notes (Signed)
Established Patient Office Visit  Subjective:  Patient ID: Amber Brown, female    DOB: 09/12/1928  Age: 86 y.o. MRN: [id number]  CC:  Chief Complaint  Patient presents with  . Hypothyroidism  . Hyperlipidemia  . Hypertension    HPI [name] presents for   History reviewed. No pertinent past medical history.Chronic visit.  Patient has HYPOTHYROIDISM.  Diagnosed 2010 years ago.  Patient has stable thyroid readings.  Patient is having loss of energy.  Last TSH was 2.31.  continue dosage of thyroid medicine.  Patient presents for follow up of hypertension.  Patient tolerating metoprolol well with side effects.  Patient was diagnosed with hypertension 2010 so has been treated for hypertension for 10 years.Patient is working on maintaining diet and exercise regimen and follows up as directed. Complication include nonre.  Patient presents with hyperlipidemia.  Compliance with treatment has been good; patient takes medicines as directed, maintains low cholesterol diet, follows up as directed, and maintains exercise regimen.  Patient is using none( patient > 80yo. without problems.    History reviewed. No pertinent surgical history.  History reviewed. No pertinent family history.  Social History   Socioeconomic History  . Marital status: Widowed    Spouse name: Not on file  . Number of children: Not on file  . Years of education: Not on file  . Highest education level: Not on file  Occupational History  . Not on file  Tobacco Use  . Smoking status: Never Smoker  . Smokeless tobacco: Never Used  Substance and Sexual Activity  . Alcohol use: Never  . Drug use: Never  . Sexual activity: Not Currently  Other Topics Concern  . Not on file  Social History Narrative  . Not on file   Social Determinants of Health   Financial Resource Strain:   . Difficulty of Paying Living Expenses: Not on file  Food Insecurity:   . Worried About Charity fundraiser in the Last Year: Not  on file  . Ran Out of Food in the Last Year: Not on file  Transportation Needs:   . Lack of Transportation (Medical): Not on file  . Lack of Transportation (Non-Medical): Not on file  Physical Activity:   . Days of Exercise per Week: Not on file  . Minutes of Exercise per Session: Not on file  Stress:   . Feeling of Stress : Not on file  Social Connections:   . Frequency of Communication with Friends and Family: Not on file  . Frequency of Social Gatherings with Friends and Family: Not on file  . Attends Religious Services: Not on file  . Active Member of Clubs or Organizations: Not on file  . Attends Archivist Meetings: Not on file  . Marital Status: Not on file  Intimate Partner Violence:   . Fear of Current or Ex-Partner: Not on file  . Emotionally Abused: Not on file  . Physically Abused: Not on file  . Sexually Abused: Not on file    Outpatient Medications Prior to Visit  Medication Sig Dispense Refill  . alendronate (FOSAMAX) 70 MG tablet     . aspirin 81 MG EC tablet Take by mouth.    . clopidogrel (PLAVIX) 75 MG tablet Take by mouth.    . dorzolamide-timolol (COSOPT) 22.3-6.8 MG/ML ophthalmic solution     . ferrous gluconate (FERGON) 324 MG tablet Take by mouth.    . levothyroxine (SYNTHROID) 50 MCG tablet     .  metoprolol succinate (TOPROL-XL) 25 MG 24 hr tablet     . mupirocin ointment (BACTROBAN) 2 % Apply 1 application topically 2 (two) times daily. 22 g 0  . pantoprazole (PROTONIX) 40 MG tablet     . ROCKLATAN 0.02-0.005 % SOLN Apply 1 drop to eye at bedtime.    . silver sulfADIAZINE (SILVADENE) 1 % cream Apply 1 application topically daily.    Marland Kitchen acetaminophen (TYLENOL) 500 MG tablet Take by mouth.    Marland Kitchen atorvastatin (LIPITOR) 40 MG tablet     . latanoprost (XALATAN) 0.005 % ophthalmic solution Administer 1 drop to both eyes nightly.    Marland Kitchen omeprazole (PRILOSEC) 20 MG capsule     . RESTASIS 0.05 % ophthalmic emulsion SMARTSIG:1 Drop(s) In Eye(s) Every 12  Hours    . Travoprost, BAK Free, (TRAVATAN Z) 0.004 % SOLN ophthalmic solution      No facility-administered medications prior to visit.    Allergies  Allergen Reactions  . Meperidine Nausea Only    ROS Review of Systems  Constitutional: Negative.   HENT: Negative.   Eyes: Negative.   Cardiovascular: Negative.   Gastrointestinal: Negative.   Endocrine: Negative.   Genitourinary: Negative.   Musculoskeletal: Negative.   Allergic/Immunologic: Negative.   Psychiatric/Behavioral: Negative.       Objective:    Physical Exam  Constitutional: She is oriented to person, place, and time. She appears well-developed and well-nourished.  HENT:  Head: Normocephalic and atraumatic.  Poor eye sight  Eyes: Pupils are equal, round, and reactive to light. Conjunctivae and EOM are normal.  Cardiovascular: Normal rate, regular rhythm and normal heart sounds.  Pulmonary/Chest: Effort normal and breath sounds normal.  Abdominal: Soft. Bowel sounds are normal.  Musculoskeletal:        General: Normal range of motion.     Cervical back: Normal range of motion and neck supple.  Neurological: She is alert and oriented to person, place, and time. She has normal reflexes.  Skin:  Skin avulsion left lower leg  Psychiatric: She has a normal mood and affect.  Vitals reviewed.   BP 140/72   Pulse 74   Temp (!) 97.1 F (36.2 C) (Temporal)   Resp 17   Ht '5\' 2"'$  (1.575 m)   Wt 147 lb 6.4 oz (66.9 kg)   SpO2 97%   BMI 26.96 kg/m  Wt Readings from Last 3 Encounters:  05/28/19 147 lb 6.4 oz (66.9 kg)     Health Maintenance Due  Topic Date Due  . TETANUS/TDAP  08/22/1949  . DEXA SCAN  08/23/1995  . PNA vac Low Risk Adult (1 of 2 - PCV13) 08/23/1995  . INFLUENZA VACCINE  10/20/2018    There are no preventive care reminders to display for this patient.  No results found for: TSH No results found for: WBC, HGB, HCT, MCV, PLT No results found for: NA, K, CHLORIDE, CO2, GLUCOSE, BUN,  CREATININE, BILITOT, ALKPHOS, AST, ALT, PROT, ALBUMIN, CALCIUM, ANIONGAP, EGFR, GFR No results found for: CHOL No results found for: HDL No results found for: LDLCALC No results found for: TRIG No results found for: CHOLHDL No results found for: HGBA1C    Assessment & Plan:   Problem List Items Addressed This Visit      Cardiovascular and Mediastinum   Hypertension - Primary    An individual care plan was established and reinforced today.  The patient's status was assessed using clinical findings on exam and labs or diagnostic tests. The patient's success at meeting  treatment goals on disease specific evidence-based guidelines and found to be well controlled. SELF MANAGEMENT: The patient and I together assessed ways to personally work towards obtaining the recommended goals. RECOMMENDATIONS: avoid decongestants found in common cold remedies, decrease consumption of alcohol, perform routine monitoring of BP with home BP cuff, exercise, reduction of dietary salt, take medicines as prescribed, try not to miss doses and quit smoking.  Regular exercise and maintaining a healthy weight is needed.  Stress reduction may help. A CLINICAL SUMMARY including written plan identify barriers to care unique to individual due to social or financial issues.  We attempt to mutually creat solutions for individual and family understanding.      Relevant Medications   aspirin 81 MG EC tablet   metoprolol succinate (TOPROL-XL) 25 MG 24 hr tablet   Other Relevant Orders   CBC with Differential   Comprehensive metabolic panel   Peripheral vascular disease ([place])    AN INDIVIDUAL CARE PLAN was established and reinforced today.  The patient's status was assessed using clinical findings on exam, labs, and other diagnostic testing. Patient's success at meeting treatment goals based on disease specific evidence-bassed guidelines and found to be in good control. RECOMMENDATIONS include maintaining present medicines and  treatment.      Relevant Medications   aspirin 81 MG EC tablet   metoprolol succinate (TOPROL-XL) 25 MG 24 hr tablet     Digestive   GERD with esophagitis    Plan of care was formulated today.  She is doing well.  A plan of care was formulated using patient exam, tests and other sources to optimize care using evidence based information.  Recommend no smoking, no eating after supper, avoid fatty foods, elevate Head of bed, avoid tight fitting clothing.  Continue on medicines..        Endocrine   Hypothyroidism, adult    AN INDIVIDUAL CARE PLAN was established and reinforced today.  The patient's status was assessed using clinical findings on exam, labs, and other diagnostic testing. Patient's success at meeting treatment goals based on disease specific evidence-bassed guidelines and found to be in good control. RECOMMENDATIONS include maintaining present medicines and treatment.      Relevant Medications   levothyroxine (SYNTHROID) 50 MCG tablet   metoprolol succinate (TOPROL-XL) 25 MG 24 hr tablet   Other Relevant Orders   TSH     Musculoskeletal and Integument   Osteoporosis of forearm without pathological fracture    AN INDIVIDUAL CARE PLAN was established and reinforced today.  The patient's status was assessed using clinical findings on exam, labs, and other diagnostic testing. Patient's success at meeting treatment goals based on disease specific evidence-bassed guidelines and found to be in good control. RECOMMENDATIONS include maintaining present medicines and treatment.      Relevant Medications   alendronate (FOSAMAX) 70 MG tablet     Genitourinary   Anemia in stage 3b chronic kidney disease    Patient was evaluated for stage 3b.  It is important to maintain good Blood Pressure control . Keep on low protein diet and remain with adequate hydration to maintain function.      Relevant Medications   ferrous gluconate (FERGON) 324 MG tablet     Other   Mixed  hyperlipidemia    AN INDIVIDUAL CARE PLAN was established and reinforced today.  The patient's status was assessed using clinical findings on exam, lab and other diagnostic tests. The patient's disease status was assessed based on evidence-based guidelines and found to  be fair controlled. MEDICATIONS were reviewed. SELF MANAGEMENT GOALS have been discussed and patient's success at attaining the goal of low cholesterol was assessed. RECOMMENDATION given include regular exercise 3 days a week and low cholesterol/low fat diet. CLINICAL SUMMARY including written plan to identify barriers unique to the patient due to social or economic  reasons was discussed.      Relevant Medications   aspirin 81 MG EC tablet   metoprolol succinate (TOPROL-XL) 25 MG 24 hr tablet   Adjustment disorder with depressed mood    Patient's depression is controlled with no medicines.   Anhedonia better.  PHQ 9 wasperformed, no performed} score 3. An individual care plan was established or reinforced today.  The patient's disease status was assessed using clinical findings on exam, labs, and or other diagnostic testing to determine patient's success in meeting treatment goals based on disease specific evidence-based guidelines and found to be improving Recommendations include no need for medicines      Sequelae of cerebral infarction    AN INDIVIDUAL CARE PLAN was established and reinforced today.  The patient's status was assessed using clinical findings on exam, labs, and other diagnostic testing. Patient's success at meeting treatment goals based on disease specific evidence-bassed guidelines and found to be in fair control. RECOMMENDATIONS include mantaining present medicines and treatment. She is having still some dysphasia.         Meds ordered this encounter  Medications  . silver sulfADIAZINE (SILVADENE) 1 % cream    Sig: Apply 1 application topically daily.    Dispense:  50 g    Refill:  2    Follow-up:  Return in about 4 months (around 09/27/2019) for fasting.    [name], MD

## 2019-05-28 NOTE — Assessment & Plan Note (Signed)
Patient was evaluated for stage 3b.  It is important to maintain good Blood Pressure control . Keep on low protein diet and remain with adequate hydration to maintain function.

## 2019-05-28 NOTE — Assessment & Plan Note (Signed)
AN INDIVIDUAL CARE PLAN was established and reinforced today.  The patient's status was assessed using clinical findings on exam, lab and other diagnostic tests. The patient's disease status was assessed based on evidence-based guidelines and found to be fair controlled. MEDICATIONS were reviewed. SELF MANAGEMENT GOALS have been discussed and patient's success at attaining the goal of low cholesterol was assessed. RECOMMENDATION given include regular exercise 3 days a week and low cholesterol/low fat diet. CLINICAL SUMMARY including written plan to identify barriers unique to the patient due to social or economic  reasons was discussed. 

## 2019-05-29 LAB — CBC WITH DIFFERENTIAL/PLATELET
Basophils Absolute: 0.1 10*3/uL (ref 0.0–0.2)
Basos: 1 %
EOS (ABSOLUTE): 0.1 10*3/uL (ref 0.0–0.4)
Eos: 1 %
Hematocrit: 45.9 % (ref 34.0–46.6)
Hemoglobin: 15 g/dL (ref 11.1–15.9)
Immature Grans (Abs): 0 10*3/uL (ref 0.0–0.1)
Immature Granulocytes: 0 %
Lymphocytes Absolute: 2.3 10*3/uL (ref 0.7–3.1)
Lymphs: 24 %
MCH: 31.1 pg (ref 26.6–33.0)
MCHC: 32.7 g/dL (ref 31.5–35.7)
MCV: 95 fL (ref 79–97)
Monocytes Absolute: 0.8 10*3/uL (ref 0.1–0.9)
Monocytes: 9 %
Neutrophils Absolute: 6.4 10*3/uL (ref 1.4–7.0)
Neutrophils: 65 %
Platelets: 285 10*3/uL (ref 150–450)
RBC: 4.82 x10E6/uL (ref 3.77–5.28)
RDW: 12.1 % (ref 11.7–15.4)
WBC: 9.6 10*3/uL (ref 3.4–10.8)

## 2019-05-29 LAB — COMPREHENSIVE METABOLIC PANEL
ALT: 12 IU/L (ref 0–32)
AST: 22 IU/L (ref 0–40)
Albumin/Globulin Ratio: 1.5 (ref 1.2–2.2)
Albumin: 4.2 g/dL (ref 3.6–4.6)
Alkaline Phosphatase: 76 IU/L (ref 39–117)
BUN/Creatinine Ratio: 14 (ref 12–28)
BUN: 14 mg/dL (ref 8–27)
Bilirubin Total: 0.7 mg/dL (ref 0.0–1.2)
CO2: 23 mmol/L (ref 20–29)
Calcium: 9.4 mg/dL (ref 8.7–10.3)
Chloride: 103 mmol/L (ref 96–106)
Creatinine, Ser: 0.97 mg/dL (ref 0.57–1.00)
GFR calc Af Amer: 60 mL/min/{1.73_m2} (ref >59)
GFR calc non Af Amer: 52 mL/min/{1.73_m2} — ABNORMAL LOW (ref >59)
Globulin, Total: 2.8 g/dL (ref 1.5–4.5)
Glucose: 126 mg/dL — ABNORMAL HIGH (ref 65–99)
Potassium: 4.8 mmol/L (ref 3.5–5.2)
Sodium: 138 mmol/L (ref 134–144)
Total Protein: 7 g/dL (ref 6.0–8.5)

## 2019-05-29 LAB — TSH: TSH: 2.39 u[IU]/mL (ref 0.450–4.500)

## 2019-05-29 NOTE — Progress Notes (Signed)
No anemia, glucose 126, renal functions stable, liver tests OK, TSH in normal range. lp

## 2019-06-21 ENCOUNTER — Telehealth: Payer: Self-pay | Admitting: Legal Medicine

## 2019-06-21 NOTE — Progress Notes (Signed)
  Chronic Care Management   Note  06/21/2019 Name: SHAMERIA TRUBY MRN: [id number] DOB: [birth year]  CORALYNN NIPPLE is a 86 y.o. year old female who is a primary care patient of Lillard Anes, MD. I reached out to [name] by phone today in response to a referral sent by Ms. Gladstone Pih PCP, Lillard Anes, MD.   Ms. Mungovan was given information about Chronic Care Management services today including:  1. CCM service includes personalized support from designated clinical staff supervised by her physician, including individualized plan of care and coordination with other care providers 2. 24/7 contact phone numbers for assistance for urgent and routine care needs. 3. Service will only be billed when office clinical staff spend 20 minutes or more in a month to coordinate care. 4. Only one practitioner may furnish and bill the service in a calendar month. 5. The patient may stop CCM services at any time (effective at the end of the month) by phone call to the office staff.   Patient agreed to services and verbal consent obtained.   Follow up plan:   Earney [place] [employer] Scheduler

## 2019-06-26 NOTE — Chronic Care Management (AMB) (Signed)
Chronic Care Management Pharmacy  Name: Amber Brown  MRN: [id number] DOB: 23-Mar-1928  Chief Complaint/ HPI  Amber Brown,  86 y.o. , female presents for their Initial CCM visit with the clinical pharmacist via telephone due to COVID-19 Pandemic.  PCP : Lillard Anes, MD  Their chronic conditions include: hypothyroidism, anemia, glaucoma, HLD, adjustment disorder with depressed mood, GERD, anemia, hypertension, osteoporosis.   Office Visits: 05/28/2019- lab results: no anemia, glucose 126, renal function stable, liver test ok, TSH in normal range. No med changes.   05/06/2019 - Patient has a scratch on leg that is very red. Started on mupirocin 2%.   01/11/2019 - vaccine administered.   Consult Visit: 03/08/2019 - Dr. Ellison Hughs for glaucoma visit.   Medications: Outpatient Encounter Medications as of 06/28/2019  Medication Sig  . alendronate (FOSAMAX) 70 MG tablet Take 70 mg by mouth once a week.   Marland Kitchen aspirin 81 MG EC tablet Take 81 mg by mouth daily.   Marland Kitchen atorvastatin (LIPITOR) 40 MG tablet Take 40 mg by mouth daily.  . dorzolamide-timolol (COSOPT) 22.3-6.8 MG/ML ophthalmic solution Place 1 drop into both eyes 2 (two) times daily.   Marland Kitchen levothyroxine (SYNTHROID) 50 MCG tablet Take 50 mcg by mouth. Take 1 tablet daily except for Fridays.  . metoprolol succinate (TOPROL-XL) 25 MG 24 hr tablet Take 25 mg by mouth 2 (two) times daily.   . Multiple Vitamins-Minerals (OCUVITE ADULT FORMULA PO) Take 1 tablet by mouth daily.  . pantoprazole (PROTONIX) 40 MG tablet Take 40 mg by mouth in the morning.   Marland Kitchen ROCKLATAN 0.02-0.005 % SOLN Apply 1 drop to eye at bedtime.  . clopidogrel (PLAVIX) 75 MG tablet Take by mouth.  . ferrous gluconate (FERGON) 324 MG tablet Take by mouth.  . mupirocin ointment (BACTROBAN) 2 % Apply 1 application topically 2 (two) times daily. (Patient not taking: Reported on 06/28/2019)  . silver sulfADIAZINE (SILVADENE) 1 % cream Apply 1 application topically daily.  (Patient not taking: Reported on 06/28/2019)   No facility-administered encounter medications on file as of 06/28/2019.   Allergies  Allergen Reactions  . Meperidine Nausea Only   SDOH Screenings   Alcohol Screen:   . Last Alcohol Screening Score (AUDIT):   Depression (PHQ2-9): Low Risk   . PHQ-2 Score: 3  Financial Resource Strain:   . Difficulty of Paying Living Expenses:   Food Insecurity: No Food Insecurity  . Worried About Charity fundraiser in the Last Year: Never true  . Ran Out of Food in the Last Year: Never true  Housing: Low Risk   . Last Housing Risk Score: 0  Physical Activity: Insufficiently Active  . Days of Exercise per Week: 5 days  . Minutes of Exercise per Session: 20 min  Social Connections:   . Frequency of Communication with Friends and Family:   . Frequency of Social Gatherings with Friends and Family:   . Attends Religious Services:   . Active Member of Clubs or Organizations:   . Attends Archivist Meetings:   Marland Kitchen Marital Status:   Stress:   . Feeling of Stress :   Tobacco Use: Low Risk   . Smoking Tobacco Use: Never Smoker  . Smokeless Tobacco Use: Never Used  Transportation Needs:   . Film/video editor (Medical):   Marland Kitchen Lack of Transportation (Non-Medical):     Current Diagnosis/Assessment:  Goals Addressed   None     Hypertension   BP today is:  <140/90  Office blood pressures are  BP Readings from Last 3 Encounters:  05/28/19 140/72    Patient has failed these meds in the past: none listed Patient is currently controlled on the following medications: metoprolol succ 25 mg bid Patient checks BP at home weekly  Patient home BP readings are ranging: 144/80  We discussed diet and exercise extensively. Eats a lot of microwave dinners due to vision. Does some pull ups 200x/day, walks to mail box, does some twists. Patient states bp is in range when she checks it.   Plan  Continue current medications    Hyperlipidemia/Peripheral Vascular Disease   Patient has failed these meds in past: clopidogrel 75 mg, Patient is currently controlled on the following medications: aspirin 81 mg, atorvastatin 40 mg  We discussed:  diet and exercise extensively. Patient understands she isn't eating as healthy as she likes but has to eat a lot of frozen meals due to her vision. She eats cereal most days for breakfast.   Plan  Continue current medications    and  Anemia    Patient has failed these meds in past:  ferous gluconate 324 mg  Patient is currently controlled on the following medications: no longer taking since last bloodwork was good  We discussed:  Patient states that her anemia is resolved. She gets rechecked every 6 months.   Plan  Continue current medications  GERD   Patient has failed these meds in past: none listed Patient is currently controlled on the following medications: pantoprazole 40 mg   We discussed:  diet and exercise extensively. Patient acknowledges good control.   Plan  Continue current medications  Glaucoma   Patient has failed these meds in past: none listed Patient is currently controlled on the following medications: dorzolamide-timolol eye drops, rocklatan 0.02%-0.005%  We discussed: Patient gets eyes checked every 3 months by Dr. Ellison Hughs.   Plan  Continue current medications  Hypothyroidism   TSH  Date Value Ref Range Status  05/28/2019 2.390 0.450 - 4.500 uIU/mL Final     Patient has failed these meds in past:n/a Patient is currently controlled on the following medications: levothyroxine 50 mcg every day except Friday  We discussed:  Patient is properly administering levothyroxine. Her thyroid is in check. She takes alendronate on Fridays so does not take her thyroid medication.   Plan  Continue current medications   Osteoporosis    Patient has failed these meds in past: n/a Patient is currently controlled on the following  medications: alendronate 70 mg  We discussed:  Patient had a dexa scan last March. She said everything is going well.   Plan  Continue current medications   Health Maintenance   Patient is currently controlled on the following medications:  -ocuvite daily for eyes  We discussed: Continuing to take medications as prescribed.   Plan  Continue current medications  Vaccines   Reviewed and discussed patient's vaccination history.  Patient received both Shingrix shots. Flu completed 10/20. Has had both COVID vaccines.   Plan  Recommended patient receive annual flu vaccine in office.  Medication Management   Pt uses [facility] for all medications She is still able to fill her pillbox and uses it daily.  Pt endorses good compliance  We discussed: Let pharmacist know if she is ever unable to fill her medication box.   Plan  Continue current therapy.     Follow up: 6 months

## 2019-06-28 ENCOUNTER — Other Ambulatory Visit: Payer: Self-pay

## 2019-06-28 ENCOUNTER — Ambulatory Visit: Payer: Medicare Other

## 2019-06-28 DIAGNOSIS — I10 Essential (primary) hypertension: Secondary | ICD-10-CM

## 2019-06-28 DIAGNOSIS — K2100 Gastro-esophageal reflux disease with esophagitis, without bleeding: Secondary | ICD-10-CM

## 2019-06-28 DIAGNOSIS — E039 Hypothyroidism, unspecified: Secondary | ICD-10-CM

## 2019-06-28 DIAGNOSIS — I739 Peripheral vascular disease, unspecified: Secondary | ICD-10-CM

## 2019-06-28 DIAGNOSIS — E782 Mixed hyperlipidemia: Secondary | ICD-10-CM

## 2019-06-28 NOTE — Progress Notes (Signed)
Appt is scheduled for 06/28/2019 with Sherre Poot, PharmD.

## 2019-06-28 NOTE — Patient Instructions (Addendum)
Visit Information  Thank you for your time discussing your medications. I look forward to working with you to achieve your health care goals. Below is a summary of what we talked about during our visit.   Goals Addressed            This Visit's Progress   . Patient Stated       CARE PLAN ENTRY  Current Barriers:  . Chronic Disease Management support, education, and care coordination needs related to GERD, HTN, HLD, and glaucoma.  Pharmacist Clinical Goal(s):  . Ensure safety, efficacy, and affordability of medications . Recommend maintaining BP <140/90 mmHg  . Goal Cholesterol: TC <200, LDL <100, TG <150 HDL >50 mg/dL  Interventions: . Comprehensive medication review performed.  Patient Self Care Activities:  . Recommend engaging in moderate intensity exercise for 30 minutes 5 times each week.  . Recommend following the Dash diet guidance of incorporating fruits, vegetables, whole-grains, low-fat dairy products, lean meats, legumes and nuts for optimal health. . Recommend keeping sodium intake less than 1500 mg/day.  .  Initial goal documentation        Ms. Markov was given information about Chronic Care Management services today including:  1. CCM service includes personalized support from designated clinical staff supervised by her physician, including individualized plan of care and coordination with other care providers 2. 24/7 contact phone numbers for assistance for urgent and routine care needs. 3. Standard insurance, coinsurance, copays and deductibles apply for chronic care management only during months in which we provide at least 20 minutes of these services. Most insurances cover these services at 100%, however patients may be responsible for any copay, coinsurance and/or deductible if applicable. This service may help you avoid the need for more expensive face-to-face services. 4. Only one practitioner may furnish and bill the service in a calendar month. 5. The  patient may stop CCM services at any time (effective at the end of the month) by phone call to the office staff.  Patient agreed to services and verbal consent obtained.   Print copy of patient instructions provided.  Telephone follow up appointment with pharmacy team member scheduled for: 12/27/2019 at 8:30 am  Sherre Poot, PharmD Clinical Pharmacist [facility] [phone]  DASH Eating Plan DASH stands for "Dietary Approaches to Stop Hypertension." The DASH eating plan is a healthy eating plan that has been shown to reduce high blood pressure (hypertension). It may also reduce your risk for type 2 diabetes, heart disease, and stroke. The DASH eating plan may also help with weight loss. What are tips for following this plan?  General guidelines  Avoid eating more than 2,300 mg (milligrams) of salt (sodium) a day. If you have hypertension, you may need to reduce your sodium intake to 1,500 mg a day.  Limit alcohol intake to no more than 1 drink a day for nonpregnant women and 2 drinks a day for men. One drink equals 12 oz of beer, 5 oz of wine, or 1 oz of hard liquor.  Work with your health care provider to maintain a healthy body weight or to lose weight. Ask what an ideal weight is for you.  Get at least 30 minutes of exercise that causes your heart to beat faster (aerobic exercise) most days of the week. Activities may include walking, swimming, or biking.  Work with your health care provider or diet and nutrition specialist (dietitian) to adjust your eating plan to your individual calorie needs. Reading food labels  Check food labels for the amount of sodium per serving. Choose foods with less than 5 percent of the Daily Value of sodium. Generally, foods with less than 300 mg of sodium per serving fit into this eating plan.  To find whole grains, look for the word "whole" as the first word in the ingredient list. Shopping  Buy products labeled as "low-sodium" or  "no salt added."  Buy fresh foods. Avoid canned foods and premade or frozen meals. Cooking  Avoid adding salt when cooking. Use salt-free seasonings or herbs instead of table salt or sea salt. Check with your health care provider or pharmacist before using salt substitutes.  Do not fry foods. Cook foods using healthy methods such as baking, boiling, grilling, and broiling instead.  Cook with heart-healthy oils, such as olive, canola, soybean, or sunflower oil. Meal planning  Eat a balanced diet that includes: ? 5 or more servings of fruits and vegetables each day. At each meal, try to fill half of your plate with fruits and vegetables. ? Up to 6-8 servings of whole grains each day. ? Less than 6 oz of lean meat, poultry, or fish each day. A 3-oz serving of meat is about the same size as a deck of cards. One egg equals 1 oz. ? 2 servings of low-fat dairy each day. ? A serving of nuts, seeds, or beans 5 times each week. ? Heart-healthy fats. Healthy fats called Omega-3 fatty acids are found in foods such as flaxseeds and coldwater fish, like sardines, salmon, and mackerel.  Limit how much you eat of the following: ? Canned or prepackaged foods. ? Food that is high in trans fat, such as fried foods. ? Food that is high in saturated fat, such as fatty meat. ? Sweets, desserts, sugary drinks, and other foods with added sugar. ? Full-fat dairy products.  Do not salt foods before eating.  Try to eat at least 2 vegetarian meals each week.  Eat more home-cooked food and less restaurant, buffet, and fast food.  When eating at a restaurant, ask that your food be prepared with less salt or no salt, if possible. What foods are recommended? The items listed may not be a complete list. Talk with your dietitian about what dietary choices are best for you. Grains Whole-grain or whole-wheat bread. Whole-grain or whole-wheat pasta. Gideon Burstein rice. Modena Morrow. Bulgur. Whole-grain and low-sodium  cereals. Pita bread. Low-fat, low-sodium crackers. Whole-wheat flour tortillas. Vegetables Fresh or frozen vegetables (raw, steamed, roasted, or grilled). Low-sodium or reduced-sodium tomato and vegetable juice. Low-sodium or reduced-sodium tomato sauce and tomato paste. Low-sodium or reduced-sodium canned vegetables. Fruits All fresh, dried, or frozen fruit. Canned fruit in natural juice (without added sugar). Meat and other protein foods Skinless chicken or [place]. Ground chicken or [place]. Pork with fat trimmed off. Fish and seafood. Egg whites. Dried beans, peas, or lentils. Unsalted nuts, nut butters, and seeds. Unsalted canned beans. Lean cuts of beef with fat trimmed off. Low-sodium, lean deli meat. Dairy Low-fat (1%) or fat-free (skim) milk. Fat-free, low-fat, or reduced-fat cheeses. Nonfat, low-sodium ricotta or cottage cheese. Low-fat or nonfat yogurt. Low-fat, low-sodium cheese. Fats and oils Soft margarine without trans fats. Vegetable oil. Low-fat, reduced-fat, or light mayonnaise and salad dressings (reduced-sodium). Canola, safflower, olive, soybean, and sunflower oils. Avocado. Seasoning and other foods Herbs. Spices. Seasoning mixes without salt. Unsalted popcorn and pretzels. Fat-free sweets. What foods are not recommended? The items listed may not be a complete list. Talk with your dietitian about  what dietary choices are best for you. Grains Baked goods made with fat, such as croissants, muffins, or some breads. Dry pasta or rice meal packs. Vegetables Creamed or fried vegetables. Vegetables in a cheese sauce. Regular canned vegetables (not low-sodium or reduced-sodium). Regular canned tomato sauce and paste (not low-sodium or reduced-sodium). Regular tomato and vegetable juice (not low-sodium or reduced-sodium). Angie Fava. Olives. Fruits Canned fruit in a light or heavy syrup. Fried fruit. Fruit in cream or butter sauce. Meat and other protein foods Fatty cuts of meat. Ribs.  Fried meat. Berniece Salines. Sausage. Bologna and other processed lunch meats. Salami. Fatback. Hotdogs. Bratwurst. Salted nuts and seeds. Canned beans with added salt. Canned or smoked fish. Whole eggs or egg yolks. Chicken or [place] with skin. Dairy Whole or 2% milk, cream, and half-and-half. Whole or full-fat cream cheese. Whole-fat or sweetened yogurt. Full-fat cheese. Nondairy creamers. Whipped toppings. Processed cheese and cheese spreads. Fats and oils Butter. Stick margarine. Lard. Shortening. Ghee. Bacon fat. Tropical oils, such as coconut, palm kernel, or palm oil. Seasoning and other foods Salted popcorn and pretzels. Onion salt, garlic salt, seasoned salt, table salt, and sea salt. Worcestershire sauce. Tartar sauce. Barbecue sauce. Teriyaki sauce. Soy sauce, including reduced-sodium. Steak sauce. Canned and packaged gravies. Fish sauce. Oyster sauce. Cocktail sauce. Horseradish that you find on the shelf. Ketchup. Mustard. Meat flavorings and tenderizers. Bouillon cubes. Hot sauce and Tabasco sauce. Premade or packaged marinades. Premade or packaged taco seasonings. Relishes. Regular salad dressings. Where to find more information:  National Heart, Lung, and [place]: https://wilson-eaton.com/  American Heart Association: www.heart.org Summary  The DASH eating plan is a healthy eating plan that has been shown to reduce high blood pressure (hypertension). It may also reduce your risk for type 2 diabetes, heart disease, and stroke.  With the DASH eating plan, you should limit salt (sodium) intake to 2,300 mg a day. If you have hypertension, you may need to reduce your sodium intake to 1,500 mg a day.  When on the DASH eating plan, aim to eat more fresh fruits and vegetables, whole grains, lean proteins, low-fat dairy, and heart-healthy fats.  Work with your health care provider or diet and nutrition specialist (dietitian) to adjust your eating plan to your individual calorie needs. This  information is not intended to replace advice given to you by your health care provider. Make sure you discuss any questions you have with your health care provider. Document Revised: 02/17/2017 Document Reviewed: 02/29/2016 Elsevier Patient Education  2020 [employer].

## 2019-07-12 ENCOUNTER — Other Ambulatory Visit: Payer: Self-pay

## 2019-07-12 MED ORDER — ALENDRONATE SODIUM 70 MG PO TABS: 70.0000 mg | ORAL_TABLET | ORAL | 2 refills | Status: DC

## 2019-08-02 DIAGNOSIS — H401133 Primary open-angle glaucoma, bilateral, severe stage: Secondary | ICD-10-CM | POA: Diagnosis not present

## 2019-08-14 ENCOUNTER — Other Ambulatory Visit: Payer: Self-pay

## 2019-08-14 MED ORDER — ATORVASTATIN CALCIUM 40 MG PO TABS: 40.0000 mg | ORAL_TABLET | Freq: Every day | ORAL | 3 refills | Status: DC

## 2019-10-02 ENCOUNTER — Other Ambulatory Visit: Payer: Self-pay

## 2019-10-02 ENCOUNTER — Ambulatory Visit (INDEPENDENT_AMBULATORY_CARE_PROVIDER_SITE_OTHER): Payer: Medicare Other | Admitting: Legal Medicine

## 2019-10-02 ENCOUNTER — Encounter: Payer: Self-pay | Admitting: Legal Medicine

## 2019-10-02 DIAGNOSIS — Z6827 Body mass index (BMI) 27.0-27.9, adult: Secondary | ICD-10-CM | POA: Diagnosis not present

## 2019-10-02 DIAGNOSIS — M7071 Other bursitis of hip, right hip: Secondary | ICD-10-CM | POA: Insufficient documentation

## 2019-10-02 NOTE — Progress Notes (Signed)
Subjective:  Patient ID: Amber Brown, female    DOB: [birth year]  Age: 86 y.o. MRN: [id number]  Chief Complaint  Patient presents with   Hip Pain    Right hip pain comes and goes    HPI: hip pain  Right hip pain for months.  Worse walking.  Gets better sitting.   She has some back pain and is to see her neurologist for epidurals. No falls.   Current Outpatient Medications on File Prior to Visit  Medication Sig Dispense Refill   alendronate (FOSAMAX) 70 MG tablet Take 1 tablet (70 mg total) by mouth once a week. 12 tablet 2   aspirin 81 MG EC tablet Take 81 mg by mouth daily.      atorvastatin (LIPITOR) 40 MG tablet Take 1 tablet (40 mg total) by mouth daily. 90 tablet 3   clopidogrel (PLAVIX) 75 MG tablet Take by mouth.     dorzolamide-timolol (COSOPT) 22.3-6.8 MG/ML ophthalmic solution Place 1 drop into both eyes 2 (two) times daily.      levothyroxine (SYNTHROID) 50 MCG tablet Take 50 mcg by mouth. Take 1 tablet daily except for Fridays.     metoprolol succinate (TOPROL-XL) 25 MG 24 hr tablet Take 25 mg by mouth 2 (two) times daily.      Multiple Vitamins-Minerals (OCUVITE ADULT FORMULA PO) Take 1 tablet by mouth daily.     mupirocin ointment (BACTROBAN) 2 % Apply 1 application topically 2 (two) times daily. 22 g 0   pantoprazole (PROTONIX) 40 MG tablet Take 40 mg by mouth in the morning.      ROCKLATAN 0.02-0.005 % SOLN Apply 1 drop to eye at bedtime.     silver sulfADIAZINE (SILVADENE) 1 % cream Apply 1 application topically daily. 50 g 2   No current facility-administered medications on file prior to visit.   Past Medical History:  Diagnosis Date   Degenerative scoliosis in adult patient 01/06/2015   GERD with esophagitis 05/28/2019   Glaucoma 02/20/2017   Hypertension 02/20/2017   Hypothyroidism, adult 05/28/2019   Mixed hyperlipidemia 05/28/2019   Osteoporosis of forearm without pathological fracture 05/28/2019   Peripheral vascular disease ([place]) 05/28/2019    Sequelae of cerebral infarction 05/28/2019   Past Surgical History:  Procedure Laterality Date   CHOLECYSTECTOMY      History reviewed. No pertinent family history. Social History   Socioeconomic History   Marital status: Widowed    Spouse name: Not on file   Number of children: Not on file   Years of education: Not on file   Highest education level: Not on file  Occupational History   Not on file  Tobacco Use   Smoking status: Never Smoker   Smokeless tobacco: Never Used  Substance and Sexual Activity   Alcohol use: Never   Drug use: Never   Sexual activity: Not Currently  Other Topics Concern   Not on file  Social History Narrative   Not on file   Social Determinants of Health   Financial Resource Strain:    Difficulty of Paying Living Expenses:   Food Insecurity: No Food Insecurity   Worried About Running Out of Food in the Last Year: Never true   [place]ray in the Last Year: Never true  Transportation Needs:    Lack of Transportation (Medical):    Lack of Transportation (Non-Medical):   Physical Activity: Insufficiently Active   Days of Exercise per Week: 5 days   Minutes of Exercise per Session:  20 min  Stress:    Feeling of Stress :   Social Connections:    Frequency of Communication with Friends and Family:    Frequency of Social Gatherings with Friends and Family:    Attends Religious Services:    Active Member of Clubs or Organizations:    Attends Archivist Meetings:    Marital Status:     Review of Systems  Constitutional: Negative.   HENT: Negative.   Respiratory: Negative.   Cardiovascular: Negative.   Gastrointestinal: Negative.   Genitourinary: Negative.   Musculoskeletal: Positive for arthralgias and back pain.  Psychiatric/Behavioral: Negative.      Objective:  BP (!) 166/74 (BP Location: Right Arm, Patient Position: Sitting)    Pulse 70    Temp (!) 97.3 F (36.3 C) (Temporal)    Resp 17    Ht  5' (1.524 m)    Wt 142 lb 3.2 oz (64.5 kg)    BMI 27.77 kg/m   BP/Weight 5/42/7062 05/25/6281  Systolic BP 151 761  Diastolic BP 74 72  Wt. (Lbs) 142.2 147.4  BMI 27.77 26.96    Physical Exam Vitals reviewed.  Constitutional:      Appearance: Normal appearance.  HENT:     Head: Normocephalic.  Musculoskeletal:     Lumbar back: Deformity, spasms and tenderness present. Negative right straight leg raise test and negative left straight leg raise test. Scoliosis present.     Comments: Pain in right buttock.  Neurological:     Mental Status: She is alert.       Lab Results  Component Value Date   WBC 9.6 05/28/2019   HGB 15.0 05/28/2019   HCT 45.9 05/28/2019   PLT 285 05/28/2019   GLUCOSE 126 (H) 05/28/2019   ALT 12 05/28/2019   AST 22 05/28/2019   NA 138 05/28/2019   K 4.8 05/28/2019   CL 103 05/28/2019   CREATININE 0.97 05/28/2019   BUN 14 05/28/2019   CO2 23 05/28/2019   TSH 2.390 05/28/2019      Assessment & Plan:   1. Iliopsoas bursitis of right hip Injection into bursa 80mg  kenalog 3 cc xylocaine After consent was obtained, using sterile technique the gluteal bursa was prepped and Ethyl Chloride was used as local anesthetic. The joint was  Steroid 80mg  mg and 3 ml plain Lidocaine was then injected and the needle withdrawn.  The procedure was well tolerated.  The patient is asked to continue to rest the joint for a few more days before resuming regular activities.  It may be more painful for the first 1-2 days.  Watch for fever, or increased swelling or persistent pain in the joint. Call or return to clinic prn if such symptoms occur or there is failure to improve as anticipated.  2. BMI 27.0-27.9,adult  Continue diet and exercise.  Weight loss in this age group is concerning       Follow-up: No follow-ups on file.  An After Visit Summary was printed and given to the patient.  [facility] [phone]

## 2019-10-04 ENCOUNTER — Telehealth: Payer: Self-pay

## 2019-10-04 NOTE — Telephone Encounter (Signed)
atient is requesting a referral to a back pain specialist. Patient has seen [facility] in [place]

## 2019-10-04 NOTE — Telephone Encounter (Signed)
Is there a form for this?

## 2019-10-04 NOTE — Telephone Encounter (Signed)
Refer back to pain clinic, lp

## 2019-10-06 ENCOUNTER — Other Ambulatory Visit: Payer: Self-pay | Admitting: Legal Medicine

## 2019-10-24 ENCOUNTER — Other Ambulatory Visit: Payer: Self-pay

## 2019-10-24 MED ORDER — SILVER SULFADIAZINE 1 % EX CREA: 1.0000 "application " | TOPICAL_CREAM | Freq: Every day | CUTANEOUS | 2 refills | Status: DC

## 2019-11-05 ENCOUNTER — Telehealth: Payer: Self-pay

## 2019-11-05 DIAGNOSIS — M810 Age-related osteoporosis without current pathological fracture: Secondary | ICD-10-CM

## 2019-11-13 ENCOUNTER — Other Ambulatory Visit: Payer: Self-pay | Admitting: Legal Medicine

## 2019-11-13 ENCOUNTER — Telehealth: Payer: Self-pay

## 2019-11-13 DIAGNOSIS — R42 Dizziness and giddiness: Secondary | ICD-10-CM

## 2019-11-13 MED ORDER — MECLIZINE HCL 25 MG PO TABS: 25.0000 mg | ORAL_TABLET | Freq: Three times a day (TID) | ORAL | 0 refills | Status: DC | PRN

## 2019-11-13 NOTE — Telephone Encounter (Signed)
Patient called in for refill of vertigo medicine, she states she is almost out but does not use often, I dont see one in her list currently, she stated it was meclizine

## 2019-11-15 DIAGNOSIS — Z1231 Encounter for screening mammogram for malignant neoplasm of breast: Secondary | ICD-10-CM | POA: Diagnosis not present

## 2019-11-19 DIAGNOSIS — M1711 Unilateral primary osteoarthritis, right knee: Secondary | ICD-10-CM | POA: Diagnosis not present

## 2019-11-19 DIAGNOSIS — M25561 Pain in right knee: Secondary | ICD-10-CM | POA: Diagnosis not present

## 2019-11-19 NOTE — Chronic Care Management (AMB) (Signed)
Chronic Care Management Pharmacy  Name: Amber Brown  MRN: [id number] DOB: 10-24-1928  Chief Complaint/ HPI  Amber Brown,  86 y.o. , female presents for their Initial CCM visit with the clinical pharmacist via telephone due to COVID-19 Pandemic.  PCP : Amber Anes, MD  Their chronic conditions include: hypothyroidism, anemia, glaucoma, HLD, adjustment disorder with depressed mood, GERD, anemia, hypertension, osteoporosis.   Office Visits: 05/28/2019- lab results: no anemia, glucose 126, renal function stable, liver test ok, TSH in normal range. No med changes.   05/06/2019 - Patient has a scratch on leg that is very red. Started on mupirocin 2%.   01/11/2019 - vaccine administered.   Consult Visit: 03/08/2019 - Dr. Ellison Brown for glaucoma visit.   Medications: Outpatient Encounter Medications as of 11/05/2019  Medication Sig  . alendronate (FOSAMAX) 70 MG tablet Take 1 tablet (70 mg total) by mouth once a week.  Marland Kitchen aspirin 81 MG EC tablet Take 81 mg by mouth daily.   Marland Kitchen atorvastatin (LIPITOR) 40 MG tablet Take 1 tablet (40 mg total) by mouth daily.  . clopidogrel (PLAVIX) 75 MG tablet TAKE 1 TABLET BY MOUTH ONCE DAILY  . dorzolamide-timolol (COSOPT) 22.3-6.8 MG/ML ophthalmic solution Place 1 drop into both eyes 2 (two) times daily.   Marland Kitchen levothyroxine (SYNTHROID) 50 MCG tablet TAKE 1 TABLET BY MOUTH  DAILY  . metoprolol succinate (TOPROL-XL) 25 MG 24 hr tablet TAKE 1 TABLET BY MOUTH  TWICE A DAY  . Multiple Vitamins-Minerals (OCUVITE ADULT FORMULA PO) Take 1 tablet by mouth daily.  . mupirocin ointment (BACTROBAN) 2 % Apply 1 application topically 2 (two) times daily.  . pantoprazole (PROTONIX) 40 MG tablet Take 40 mg by mouth in the morning.   Marland Kitchen ROCKLATAN 0.02-0.005 % SOLN Apply 1 drop to eye at bedtime.  . silver sulfADIAZINE (SILVADENE) 1 % cream Apply 1 application topically daily.   No facility-administered encounter medications on file as of 11/05/2019.   Allergies   Allergen Reactions  . Meperidine Nausea Only   SDOH Screenings   Alcohol Screen:   . Last Alcohol Screening Score (AUDIT): Not on file  Depression (PHQ2-9): Low Risk   . PHQ-2 Score: 3  Financial Resource Strain:   . Difficulty of Paying Living Expenses: Not on file  Food Insecurity: No Food Insecurity  . Worried About Charity fundraiser in the Last Year: Never true  . Ran Out of Food in the Last Year: Never true  Housing: Low Risk   . Last Housing Risk Score: 0  Physical Activity: Insufficiently Active  . Days of Exercise per Week: 5 days  . Minutes of Exercise per Session: 20 min  Social Connections:   . Frequency of Communication with Friends and Family: Not on file  . Frequency of Social Gatherings with Friends and Family: Not on file  . Attends Religious Services: Not on file  . Active Member of Clubs or Organizations: Not on file  . Attends Archivist Meetings: Not on file  . Marital Status: Not on file  Stress:   . Feeling of Stress : Not on file  Tobacco Use: Low Risk   . Smoking Tobacco Use: Never Smoker  . Smokeless Tobacco Use: Never Used  Transportation Needs:   . Film/video editor (Medical): Not on file  . Lack of Transportation (Non-Medical): Not on file    Current Diagnosis/Assessment:  Goals Addressed            This Visit's  Progress   . Patient Stated       CARE PLAN ENTRY  Current Barriers:  . Chronic Disease Management support, education, and care coordination needs related to GERD, HTN, HLD, and glaucoma.  Pharmacist Clinical Goal(s):  . Ensure safety, efficacy, and affordability of medications . Recommend maintaining BP <140/90 mmHg  . Goal Cholesterol: TC <200, LDL <100, TG <150 HDL >50 mg/dL  Interventions: . Comprehensive medication review performed. . Reviewed the cost of Prolia with patient. Patient is uninterested at this time due to cost. There is no funding available at this time through [facility] for initial  starts.   Patient Self Care Activities:  . Recommend engaging in moderate intensity exercise for 30 minutes 5 times each week.  . Recommend following the Dash diet guidance of incorporating fruits, vegetables, whole-grains, low-fat dairy products, lean meats, legumes and nuts for optimal health. . Recommend keeping sodium intake less than 1500 mg/day.  .  Initial goal documentation         Osteoporosis    Patient has failed these meds in past: n/a Patient is currently controlled on the following medications: alendronate 70 mg  We discussed:  Patient had a dexa scan last March. She said everything is going well.   Update 11/19/2019 - Provider recommended Prolia. Pharmacist contacted patient with eligibility verification results. Patient is not interested at this time due to cost. She is tolerating alendronate well. Pharmacist reviewed availability of funds to help with affordability but not available at this time for new starts. Will continue to monitor going forward.   Plan  Continue current medications     Vaccines   Reviewed and discussed patient's vaccination history.  Patient received both Shingrix shots. Flu completed 10/20. Has had both COVID vaccines.   Plan  Recommended patient receive annual flu vaccine in office.  Medication Management   Pt uses [facility] for all medications She is still able to fill her pillbox and uses it daily.  Pt endorses good compliance  We discussed: Let pharmacist know if she is ever unable to fill her medication box.   Plan  Continue current therapy.     Follow up: 6 months

## 2019-12-02 ENCOUNTER — Encounter: Payer: Self-pay | Admitting: Legal Medicine

## 2019-12-06 DIAGNOSIS — H401133 Primary open-angle glaucoma, bilateral, severe stage: Secondary | ICD-10-CM | POA: Diagnosis not present

## 2019-12-27 ENCOUNTER — Telehealth: Payer: Medicare Other

## 2020-01-06 ENCOUNTER — Telehealth: Payer: Self-pay

## 2020-01-06 NOTE — Chronic Care Management (AMB) (Signed)
Chronic Care Management Pharmacy Assistant    Name: Amber Brown  MRN: [id number] DOB: Jul 12, 1928    Reason for Encounter: Disease State - Hypertension Adherence Call.   Patient Questions:   1.  Have you seen any other providers since your last visit?  No   2.  Any changes in your medicines or health?  Patient states she still has problems with her eyes, she does see Optometrist.      PCP : Lillard Anes, MD  Allergies:   Allergies  Allergen Reactions   Meperidine Nausea Only    Medications: Outpatient Encounter Medications as of 01/06/2020  Medication Sig   alendronate (FOSAMAX) 70 MG tablet Take 1 tablet (70 mg total) by mouth once a week.   aspirin 81 MG EC tablet Take 81 mg by mouth daily.    atorvastatin (LIPITOR) 40 MG tablet Take 1 tablet (40 mg total) by mouth daily.   clopidogrel (PLAVIX) 75 MG tablet TAKE 1 TABLET BY MOUTH ONCE DAILY   dorzolamide-timolol (COSOPT) 22.3-6.8 MG/ML ophthalmic solution Place 1 drop into both eyes 2 (two) times daily.    levothyroxine (SYNTHROID) 50 MCG tablet TAKE 1 TABLET BY MOUTH  DAILY   meclizine (ANTIVERT) 25 MG tablet Take 1 tablet (25 mg total) by mouth 3 (three) times daily as needed for dizziness.   metoprolol succinate (TOPROL-XL) 25 MG 24 hr tablet TAKE 1 TABLET BY MOUTH  TWICE A DAY   Multiple Vitamins-Minerals (OCUVITE ADULT FORMULA PO) Take 1 tablet by mouth daily.   mupirocin ointment (BACTROBAN) 2 % Apply 1 application topically 2 (two) times daily.   pantoprazole (PROTONIX) 40 MG tablet Take 40 mg by mouth in the morning.    ROCKLATAN 0.02-0.005 % SOLN Apply 1 drop to eye at bedtime.   silver sulfADIAZINE (SILVADENE) 1 % cream Apply 1 application topically daily.   No facility-administered encounter medications on file as of 01/06/2020.    Current Diagnosis: Patient Active Problem List   Diagnosis Date Noted   Bursitis of hip, right 10/02/2019   BMI 27.0-27.9,adult 10/02/2019    Hypothyroidism, adult 05/28/2019   Mixed hyperlipidemia 05/28/2019   Adjustment disorder with depressed mood 05/28/2019   Peripheral vascular disease ([place]) 05/28/2019   GERD with esophagitis 05/28/2019   Anemia in stage 3b chronic kidney disease ([place]) 05/28/2019   Osteoporosis of forearm without pathological fracture 05/28/2019   Sequelae of cerebral infarction 05/28/2019   History of amputation of lesser toe of right foot ([place]) 02/28/2018   Deformity, toe acquired, right 01/12/2018   Diminished pulses in lower extremity 02/20/2017   Glaucoma 02/20/2017   Hypertension 02/20/2017   Degenerative scoliosis in adult patient 01/06/2015   Lumbar stenosis with neurogenic claudication 06/30/2014   Bilateral lower extremity pain 01/03/2014   Reviewed chart prior to disease state call. Spoke with patient regarding BP  Recent Office Vitals: BP Readings from Last 3 Encounters:  10/02/19 (!) 166/74  05/28/19 140/72   Pulse Readings from Last 3 Encounters:  10/02/19 70  05/28/19 74    Wt Readings from Last 3 Encounters:  10/02/19 142 lb 3.2 oz (64.5 kg)  05/28/19 147 lb 6.4 oz (66.9 kg)     Kidney Function Lab Results  Component Value Date/Time   CREATININE 0.97 05/28/2019 11:09 AM   GFRNONAA 52 (L) 05/28/2019 11:09 AM   GFRAA 60 05/28/2019 11:09 AM    BMP Latest Ref Rng & Units 05/28/2019  Glucose 65 - 99 mg/dL 126(H)  BUN 8 -  27 mg/dL 14  Creatinine 0.57 - 1.00 mg/dL 0.97  BUN/Creat Ratio 12 - 28 14  Sodium 134 - 144 mmol/L 138  Potassium 3.5 - 5.2 mmol/L 4.8  Chloride 96 - 106 mmol/L 103  CO2 20 - 29 mmol/L 23  Calcium 8.7 - 10.3 mg/dL 9.4     Current antihypertensive regimen:  o Metoprolol Succinate 25 mg  One tablet twice a day   How often are you checking your Blood Pressure? Patient states only when her heart is racing.    Current home BP readings: none   What recent interventions/DTPs have been made by any provider to improve Blood Pressure  control since last CPP Visit: Patient states she takes her medications as directed by provider   Any recent hospitalizations or ED visits since last visit with CPP?  No   What diet changes have been made to improve Blood Pressure Control?  o Patient states she does not eat salt, states that she does not  eat as healthy as she did when she did her own  cooking.    What exercise is being done to improve your Blood Pressure Control?  o Patient does not have a exercise regiment, but she is active, church, mops kitchen , vacuums , walks to mail box every day.  Adherence Review: Is the patient currently on ACE/ARB medication? No Does the patient have >5 day gap between last estimated fill dates? No      Goals Addressed            This Visit's Progress    Patient Stated   Not on track    CARE PLAN ENTRY  Current Barriers:   Chronic Disease Management support, education, and care coordination needs related to GERD, HTN, HLD, and glaucoma.  Pharmacist Clinical Goal(s):   Ensure safety, efficacy, and affordability of medications  Recommend maintaining BP <140/90 mmHg   Goal Cholesterol: TC <200, LDL <100, TG <150 HDL >50 mg/dL  Interventions:  Comprehensive medication review performed.  Reviewed the cost of Prolia with patient. Patient is uninterested at this time due to cost. There is no funding available at this time through [facility] for initial starts.   Patient Self Care Activities:   Recommend engaging in moderate intensity exercise for 30 minutes 5 times each week.   Recommend following the [employer] guidance of incorporating fruits, vegetables, whole-grains, low-fat dairy products, lean meats, legumes and nuts for optimal health.  Recommend keeping sodium intake less than 1500 mg/day.    Please see past updates related to this goal by clicking on the "Past Updates" button in the selected goal         Follow-Up:  Pharmacist Review - Patient states she has  been taking all medication on list. Patient does not take blood pressure at home, states she does have a monitor.   Elly Modena [place] CPP.  Notified.  Judithann Sheen, [facility] Clinical Pharmacist Assistant [phone]

## 2020-01-08 DIAGNOSIS — L820 Inflamed seborrheic keratosis: Secondary | ICD-10-CM | POA: Diagnosis not present

## 2020-01-08 DIAGNOSIS — D225 Melanocytic nevi of trunk: Secondary | ICD-10-CM | POA: Diagnosis not present

## 2020-01-08 DIAGNOSIS — D485 Neoplasm of uncertain behavior of skin: Secondary | ICD-10-CM | POA: Diagnosis not present

## 2020-01-08 DIAGNOSIS — L821 Other seborrheic keratosis: Secondary | ICD-10-CM | POA: Diagnosis not present

## 2020-01-31 DIAGNOSIS — M25569 Pain in unspecified knee: Secondary | ICD-10-CM | POA: Diagnosis not present

## 2020-02-19 ENCOUNTER — Telehealth: Payer: Self-pay

## 2020-02-19 NOTE — Chronic Care Management (AMB) (Signed)
Chronic Care Management Pharmacy Assistant   Name: Amber Brown  MRN: [id number] DOB: Jul 28, 1928  Reason for Encounter: Medication Review  Patient Questions:  1.  Have you seen any other providers since your last visit? No  2.  Any changes in your medicines or health? No    PCP : Lillard Anes, MD  Allergies:   Allergies  Allergen Reactions  . Meperidine Nausea Only    Medications: Outpatient Encounter Medications as of 02/19/2020  Medication Sig  . alendronate (FOSAMAX) 70 MG tablet Take 1 tablet (70 mg total) by mouth once a week.  Marland Kitchen aspirin 81 MG EC tablet Take 81 mg by mouth daily.   Marland Kitchen atorvastatin (LIPITOR) 40 MG tablet Take 1 tablet (40 mg total) by mouth daily.  . clopidogrel (PLAVIX) 75 MG tablet TAKE 1 TABLET BY MOUTH ONCE DAILY  . dorzolamide-timolol (COSOPT) 22.3-6.8 MG/ML ophthalmic solution Place 1 drop into both eyes 2 (two) times daily.   Marland Kitchen levothyroxine (SYNTHROID) 50 MCG tablet TAKE 1 TABLET BY MOUTH  DAILY  . meclizine (ANTIVERT) 25 MG tablet Take 1 tablet (25 mg total) by mouth 3 (three) times daily as needed for dizziness.  . metoprolol succinate (TOPROL-XL) 25 MG 24 hr tablet TAKE 1 TABLET BY MOUTH  TWICE A DAY  . Multiple Vitamins-Minerals (OCUVITE ADULT FORMULA PO) Take 1 tablet by mouth daily.  . mupirocin ointment (BACTROBAN) 2 % Apply 1 application topically 2 (two) times daily.  . pantoprazole (PROTONIX) 40 MG tablet Take 40 mg by mouth in the morning.   Marland Kitchen ROCKLATAN 0.02-0.005 % SOLN Apply 1 drop to eye at bedtime.  . silver sulfADIAZINE (SILVADENE) 1 % cream Apply 1 application topically daily.   No facility-administered encounter medications on file as of 02/19/2020.    Current Diagnosis: Patient Active Problem List   Diagnosis Date Noted  . Bursitis of hip, right 10/02/2019  . BMI 27.0-27.9,adult 10/02/2019  . Hypothyroidism, adult 05/28/2019  . Mixed hyperlipidemia 05/28/2019  . Adjustment disorder with depressed mood 05/28/2019    . Peripheral vascular disease ([place]) 05/28/2019  . GERD with esophagitis 05/28/2019  . Anemia in stage 3b chronic kidney disease ([place]) 05/28/2019  . Osteoporosis of forearm without pathological fracture 05/28/2019  . Sequelae of cerebral infarction 05/28/2019  . History of amputation of lesser toe of right foot ([place]) 02/28/2018  . Deformity, toe acquired, right 01/12/2018  . Diminished pulses in lower extremity 02/20/2017  . Glaucoma 02/20/2017  . Hypertension 02/20/2017  . Degenerative scoliosis in adult patient 01/06/2015  . Lumbar stenosis with neurogenic claudication 06/30/2014  . Bilateral lower extremity pain 01/03/2014   Reviewed chart prior to disease state call. Spoke with patient regarding BP  Recent Office Vitals: BP Readings from Last 3 Encounters:  10/02/19 (!) 166/74  05/28/19 140/72   Pulse Readings from Last 3 Encounters:  10/02/19 70  05/28/19 74    Wt Readings from Last 3 Encounters:  10/02/19 142 lb 3.2 oz (64.5 kg)  05/28/19 147 lb 6.4 oz (66.9 kg)     Kidney Function Lab Results  Component Value Date/Time   CREATININE 0.97 05/28/2019 11:09 AM   GFRNONAA 52 (L) 05/28/2019 11:09 AM   GFRAA 60 05/28/2019 11:09 AM    BMP Latest Ref Rng & Units 05/28/2019  Glucose 65 - 99 mg/dL 126(H)  BUN 8 - 27 mg/dL 14  Creatinine 0.57 - 1.00 mg/dL 0.97  BUN/Creat Ratio 12 - 28 14  Sodium 134 - 144 mmol/L 138  Potassium 3.5 - 5.2 mmol/L 4.8  Chloride 96 - 106 mmol/L 103  CO2 20 - 29 mmol/L 23  Calcium 8.7 - 10.3 mg/dL 9.4    . Current antihypertensive regimen:   metoprolol succ 25 mg twice a day  . How often are you checking your Blood Pressure? infrequently    Current home BP readings: States she "took it the other day and it was good". She could not provide the reading. She does not take blood pressure frequently.  . What recent interventions/DTPs have been made by any provider to improve Blood Pressure control since last CPP Visit: She states she is  maintaining low salt diet and taking her medications.   . Any recent hospitalizations or ED visits since last visit with CPP? No   . What diet changes have been made to improve Blood Pressure Control?  o She maintains a low salt diet.  . What exercise is being done to improve your Blood Pressure Control?  o She states she as an exercise machine at home that works out her arms and legs. She as not been able to do much as her knee is very painful from osteoarthritis. She did have a cortisone injection recently in the knee by Dr. Joie Bimler.    Adherence Review: Is the patient currently on ACE/ARB medication? No Does the patient have >5 day gap between last estimated fill dates? No   Lipid Panel No results found for: CHOL, TRIG, HDL, LDLCALC, LDLDIRECT  10-year ASCVD risk score: The ASCVD Risk score Mikey Bussing DC Jr., et al., 2013) failed to calculate for the following reasons:   The 2013 ASCVD risk score is only valid for ages 68 to 83  . Current antihyperlipidemic regimen:   atorvastatin 40 mg  . Previous antihyperlipidemic medications tried: clopidogrel 75 mg  . ASCVD risk enhancing conditions: age >3 and HTN   . What recent interventions/DTPs have been made by any provider to improve Cholesterol control since last CPP Visit:   States she takes medications regularly.   . Any recent hospitalizations or ED visits since last visit with CPP? No   . What diet changes have been made to improve Cholesterol?  o No recent diet changes.   . What exercise is being done to improve Cholesterol?   She states she as an exercise machine at home that works out her arms and legs. She as not been able to do much as her knee is very painful from osteoarthritis. She did have a cortisone injection recently in the knee by Dr. Joie Bimler.   Adherence Review: Does the patient have >5 day gap between last estimated fill dates? No  Patient states she would like to discuss stopping aspirin 81 mg at her appointment  with Clarise Cruz on 02/27/20. She states she bleeds and bruises very easily.      Goals Addressed   None     Follow-Up:  Pharmacist Review   Clarise Cruz B. Owens Shark, [place], [place] Pharmacy Assistant [phone]

## 2020-02-25 DIAGNOSIS — M1711 Unilateral primary osteoarthritis, right knee: Secondary | ICD-10-CM | POA: Diagnosis not present

## 2020-02-27 ENCOUNTER — Other Ambulatory Visit: Payer: Self-pay | Admitting: Legal Medicine

## 2020-02-27 ENCOUNTER — Other Ambulatory Visit: Payer: Self-pay

## 2020-02-27 ENCOUNTER — Ambulatory Visit: Payer: Medicare Other

## 2020-02-27 DIAGNOSIS — I10 Essential (primary) hypertension: Secondary | ICD-10-CM

## 2020-02-27 DIAGNOSIS — E782 Mixed hyperlipidemia: Secondary | ICD-10-CM

## 2020-02-27 NOTE — Chronic Care Management (AMB) (Signed)
Chronic Care Management Pharmacy  Name: MARYE EAGEN  MRN: [id number] DOB: 1928/12/25  Chief Complaint/ HPI  [name],  86 y.o. , female presents for their Follow-Up CCM visit with the clinical pharmacist via telephone due to COVID-19 Pandemic.  PCP : Lillard Anes, MD  Their chronic conditions include: hypothyroidism, anemia, glaucoma, HLD, adjustment disorder with depressed mood, GERD, anemia, hypertension, osteoporosis.   Office Visits: Due for chronic visit discussed with patient during call. She will call back to schedule after discussing a good time with niece to bring her.   Consult Visit: 03/08/2019 - Dr. Ellison Hughs for glaucoma visit.   Medications: Outpatient Encounter Medications as of 02/27/2020  Medication Sig  . calcium carbonate (OSCAL) 1500 (600 Ca) MG TABS tablet Take 600 mg of elemental calcium by mouth daily with breakfast.  . aspirin 81 MG EC tablet Take 81 mg by mouth daily.   Marland Kitchen atorvastatin (LIPITOR) 40 MG tablet Take 1 tablet (40 mg total) by mouth daily.  . clopidogrel (PLAVIX) 75 MG tablet TAKE 1 TABLET BY MOUTH ONCE DAILY  . dorzolamide-timolol (COSOPT) 22.3-6.8 MG/ML ophthalmic solution Place 1 drop into both eyes 2 (two) times daily.   Marland Kitchen levothyroxine (SYNTHROID) 50 MCG tablet TAKE 1 TABLET BY MOUTH  DAILY  . meclizine (ANTIVERT) 25 MG tablet Take 1 tablet (25 mg total) by mouth 3 (three) times daily as needed for dizziness.  . metoprolol succinate (TOPROL-XL) 25 MG 24 hr tablet TAKE 1 TABLET BY MOUTH  TWICE A DAY  . Multiple Vitamins-Minerals (OCUVITE ADULT FORMULA PO) Take 1 tablet by mouth daily.  . mupirocin ointment (BACTROBAN) 2 % Apply 1 application topically 2 (two) times daily.  . pantoprazole (PROTONIX) 40 MG tablet Take 40 mg by mouth in the morning.   Marland Kitchen ROCKLATAN 0.02-0.005 % SOLN Apply 1 drop to eye at bedtime.  . silver sulfADIAZINE (SILVADENE) 1 % cream Apply 1 application topically daily.  . [DISCONTINUED] alendronate (FOSAMAX)  70 MG tablet Take 1 tablet (70 mg total) by mouth once a week.   No facility-administered encounter medications on file as of 02/27/2020.   Allergies  Allergen Reactions  . Meperidine Nausea Only   SDOH Screenings   Alcohol Screen: Not on file  Depression (PHQ2-9): Low Risk   . PHQ-2 Score: 3  Financial Resource Strain: Not on file  Food Insecurity: No Food Insecurity  . Worried About Charity fundraiser in the Last Year: Never true  . Ran Out of Food in the Last Year: Never true  Housing: Low Risk   . Last Housing Risk Score: 0  Physical Activity: Insufficiently Active  . Days of Exercise per Week: 5 days  . Minutes of Exercise per Session: 20 min  Social Connections: Not on file  Stress: Not on file  Tobacco Use: Low Risk   . Smoking Tobacco Use: Never Smoker  . Smokeless Tobacco Use: Never Used  Transportation Needs: Not on file    Current Diagnosis/Assessment:  Goals Addressed            This Visit's Progress   . Patient [employer] (see longitudinal plan of care for additional care plan information)  Current Barriers:  . Chronic Disease Management support, education, and care coordination needs related to Hypertension and Hyperlipidemia   Hypertension BP Readings from Last 3 Encounters:  10/02/19 (!) 166/74  05/28/19 140/72   . Pharmacist Clinical Goal(s): o Over the next 90  days, patient will work with PharmD and providers to maintain BP goal <140/90 . Current regimen:  o Metoprolol succinate 25 mg bid  . Interventions: o Reviewed home blood pressure readings.  o Discussed medication compliance.  . Patient self care activities - Over the next 90 days, patient will: o Check BP weekly, document, and provide at future appointments o Ensure daily salt intake < 2300 mg/day  Hyperlipidemia No results found for: LDLCALC, LDLDIRECT . Pharmacist Clinical Goal(s): o Over the next 90 days, patient will work with PharmD and providers to  maintain LDL goal < 100 . Current regimen:   aspirin 81 mg daily  atorvastatin 40 mg  Clopidogrel 75 mg daily  . Interventions: o Encouraged patient to schedule visit with Dr. Henrene Pastor for updated labs.  o Discussed and reviewed medication adherence (100%).  . Patient self care activities - Over the next 90 days, patient will: o Continue taking medications as prescribed.  o Schedule visit and labs with Dr. Henrene Pastor.  Medication management . Pharmacist Clinical Goal(s): o Over the next 90 days, patient will work with PharmD and providers to maintain optimal medication adherence . Current pharmacy: [facility] . Interventions o Comprehensive medication review performed. o Continue current medication management strategy . Patient self care activities - Over the next 90 days, patient will: o Focus on medication adherence by continuing to use pill box.  o Take medications as prescribed o Report any questions or concerns to PharmD and/or provider(s)  Please see past updates related to this goal by clicking on the "Past Updates" button in the selected goal          Hypertension   BP today is:  <140/90  Office blood pressures are  BP Readings from Last 3 Encounters:  10/02/19 (!) 166/74  05/28/19 140/72    Patient has failed these meds in the past: none listed Patient is currently controlled on the following medications:   metoprolol succ 25 mg bid  Patient checks BP at home weekly  Patient home BP readings are ranging: 144/80  We discussed diet and exercise extensively. Patient unable to exercise at this point due to her knee. She is seeing orthopedic for gel shots soon and hopes this improves mobility.   Plan  Continue current medications   Hyperlipidemia/Peripheral Vascular Disease   Lipid Panel  No results found for: CHOL, TRIG, HDL, CHOLHDL, VLDL, LDLCALC, LDLDIRECT, LABVLDL  Patient has failed these meds in past: none reported Patient is currently controlled on  the following medications:   aspirin 81 mg daily  atorvastatin 40 mg  Clopidogrel 75 mg daily   We discussed:  diet and exercise extensively. Patient due for updated bloodwork with Dr. Henrene Pastor.  Offered to coordinate chronic visit with Dr. Henrene Pastor while on the phone. Patient will coordinate transportation to visit and call back to schedule.   Plan  Continue current medications   GERD   Patient has failed these meds in past: none listed Patient is currently controlled on the following medications:   pantoprazole 40 mg   We discussed:  diet and exercise extensively. Patient acknowledges good control.   Plan  Continue current medications   Hypothyroidism   TSH  Date Value Ref Range Status  05/28/2019 2.390 0.450 - 4.500 uIU/mL Final     Patient has failed these meds in past:n/a Patient is currently controlled on the following medications:   levothyroxine 50 mcg every day except Friday  We discussed:  Patient is properly administering levothyroxine. Her  thyroid is in check. She takes alendronate on Fridays so does not take her thyroid medication.   Plan  Continue current medications   Osteoporosis    Patient has failed these meds in past: n/a Patient is currently controlled on the following medications:   alendronate 70 mg weekly   Calcium 600 mg daily with breakfast  We discussed:  Patient had a dexa scan last March. She said everything is going well. She is not interested in Prolia due to cost at this time.   Plan  Continue current medications   Health Maintenance   Patient is currently controlled on the following medications:   ocuvite daily for eyes  We discussed: Continuing to take medications as prescribed.   - knee pain - patient has knee pain and followed up with Dr. Joie Bimler this week. He drained a lot of fluid out of her knee which was painful. She had more injections in it. She follows up with him 12/27 for gel shot. She hopes that she will be  able to exercise and walk better after gel shots.   Plan  Continue current medications  Vaccines   Reviewed and discussed patient's vaccination history.  Patient received both Shingrix shots.   Flu completed for 2021-2022 per patient. Has had both Moderna COVID vaccines. She is debating on a third shot. She isn't driving and has to rely on her niece to get to appointments.   Immunization History  Administered Date(s) Administered  . Zoster 01/10/2018  . Zoster Recombinat (Shingrix) 04/26/2018   Plan  Recommended patient receive third Covid vaccine in office.  Medication Management   Pt uses [facility] for all medications She is still able to fill her pillbox and uses it daily.  Pt endorses good compliance  We discussed: Let pharmacist know if she is ever unable to fill her medication box. Denies problems getting medication through mail order.   Plan  Continue current therapy.   Follow up: 6 months

## 2020-02-28 NOTE — Patient Instructions (Addendum)
Visit Information  Goals Addressed            This Visit's Progress   . Patient Amber Brown (see longitudinal plan of care for additional care plan information)  Current Barriers:  . Chronic Disease Management support, education, and care coordination needs related to Hypertension and Hyperlipidemia   Hypertension BP Readings from Last 3 Encounters:  10/02/19 (!) 166/74  05/28/19 140/72   . Pharmacist Clinical Goal(s): o Over the next 90 days, patient will work with PharmD and providers to maintain BP goal <140/90 . Current regimen:  o Metoprolol succinate 25 mg bid  . Interventions: o Reviewed home blood pressure readings.  o Discussed medication compliance.  . Patient self care activities - Over the next 90 days, patient will: o Check BP weekly, document, and provide at future appointments o Ensure daily salt intake < 2300 mg/day  Hyperlipidemia No results found for: LDLCALC, LDLDIRECT . Pharmacist Clinical Goal(s): o Over the next 90 days, patient will work with PharmD and providers to maintain LDL goal < 100 . Current regimen:   aspirin 81 mg daily  atorvastatin 40 mg  Clopidogrel 75 mg daily  . Interventions: o Encouraged patient to schedule visit with Dr. Henrene Pastor for updated labs.  o Discussed and reviewed medication adherence (100%).  . Patient self care activities - Over the next 90 days, patient will: o Continue taking medications as prescribed.  o Schedule visit and labs with Dr. Henrene Pastor.  Medication management . Pharmacist Clinical Goal(s): o Over the next 90 days, patient will work with PharmD and providers to maintain optimal medication adherence . Current pharmacy: [facility] . Interventions o Comprehensive medication review performed. o Continue current medication management strategy . Patient self care activities - Over the next 90 days, patient will: o Focus on medication adherence by continuing to use pill box.  o Take medications  as prescribed o Report any questions or concerns to PharmD and/or provider(s)  Please see past updates related to this goal by clicking on the "Past Updates" button in the selected goal          The patient verbalized understanding of instructions, educational materials, and care plan provided today and declined offer to receive copy of patient instructions, educational materials, and care plan.   Telephone follow up appointment with pharmacy team member scheduled for: 08/2020  Burnice Logan, [facility]  Exercises To Do While Sitting  Exercises that you do while sitting (chair exercises) can give you many of the same benefits as full exercise. Benefits include strengthening your heart, burning calories, and keeping muscles and joints healthy. Exercise can also improve your mood and help with depression and anxiety. You may benefit from chair exercises if you are unable to do standing exercises because of:  Diabetic foot pain.  Obesity.  Illness.  Arthritis.  Recovery from surgery or injury.  Breathing problems.  Balance problems.  Another type of disability. Before starting chair exercises, check with your health care provider or a physical therapist to find out how much exercise you can tolerate and which exercises are safe for you. If your health care provider approves:  Start out slowly and build up over time. Aim to work up to about 10-20 minutes for each exercise session.  Make exercise part of your daily routine.  Drink water when you exercise. Do not wait until you are thirsty. Drink every 10-15 minutes.  Stop exercising right away if you  have pain, nausea, shortness of breath, or dizziness.  If you are exercising in a wheelchair, make sure to lock the wheels.  Ask your health care provider whether you can do tai chi or yoga. Many positions in these mind-body exercises can be modified to do while seated. Warm-up Before starting other exercises: 1. Sit up as straight  as you can. Have your knees bent at 90 degrees, which is the shape of the capital letter "L." Keep your feet flat on the floor. 2. Sit at the front edge of your chair, if you can. 3. Pull in (tighten) the muscles in your abdomen and stretch your spine and neck as straight as you can. Hold this position for a few minutes. 4. Breathe in and out evenly. Try to concentrate on your breathing, and relax your mind. Stretching Exercise A: Arm stretch 1. Hold your arms out straight in front of your body. 2. Bend your hands at the wrist with your fingers pointing up, as if signaling someone to stop. Notice the slight tension in your forearms as you hold the position. 3. Keeping your arms out and your hands bent, rotate your hands outward as far as you can and hold this stretch. Aim to have your thumbs pointing up and your pinkie fingers pointing down. Slowly repeat arm stretches for one minute as tolerated. Exercise B: Leg stretch 1. If you can move your legs, try to "draw" letters on the floor with the toes of your foot. Write your name with one foot. 2. Write your name with the toes of your other foot. Slowly repeat the movements for one minute as tolerated. Exercise C: Reach for the sky 1. Reach your hands as far over your head as you can to stretch your spine. 2. Move your hands and arms as if you are climbing a rope. Slowly repeat the movements for one minute as tolerated. Range of motion exercises Exercise A: Shoulder roll 1. Let your arms hang loosely at your sides. 2. Lift just your shoulders up toward your ears, then let them relax back down. 3. When your shoulders feel loose, rotate your shoulders in backward and forward circles. Do shoulder rolls slowly for one minute as tolerated. Exercise B: March in place 1. As if you are marching, pump your arms and lift your legs up and down. Lift your knees as high as you can. ? If you are unable to lift your knees, just pump your arms and move your  ankles and feet up and down. March in place for one minute as tolerated. Exercise C: Seated jumping jacks 1. Let your arms hang down straight. 2. Keeping your arms straight, lift them up over your head. Aim to point your fingers to the ceiling. 3. While you lift your arms, straighten your legs and slide your heels along the floor to your sides, as wide as you can. 4. As you bring your arms back down to your sides, slide your legs back together. ? If you are unable to use your legs, just move your arms. Slowly repeat seated jumping jacks for one minute as tolerated. Strengthening exercises Exercise A: Shoulder squeeze 1. Hold your arms straight out from your body to your sides, with your elbows bent and your fists pointed at the ceiling. 2. Keeping your arms in the bent position, move them forward so your elbows and forearms meet in front of your face. 3. Open your arms back out as wide as you can with your elbows still  bent, until you feel your shoulder blades squeezing together. Hold for 5 seconds. Slowly repeat the movements forward and backward for one minute as tolerated. Contact a health care provider if you:  Had to stop exercising due to any of the following: ? Pain. ? Nausea. ? Shortness of breath. ? Dizziness. ? Fatigue.  Have significant pain or soreness after exercising. Get help right away if you have:  Chest pain.  Difficulty breathing. These symptoms may represent a serious problem that is an emergency. Do not wait to see if the symptoms will go away. Get medical help right away. Call your local emergency services (911 in the [place] Do not drive yourself to the hospital. This information is not intended to replace advice given to you by your health care provider. Make sure you discuss any questions you have with your health care provider. Document Revised: 06/28/2018 Document Reviewed: 01/18/2017 Elsevier Patient Education  2020 [employer].

## 2020-03-16 DIAGNOSIS — M1711 Unilateral primary osteoarthritis, right knee: Secondary | ICD-10-CM | POA: Diagnosis not present

## 2020-03-24 ENCOUNTER — Other Ambulatory Visit: Payer: Self-pay | Admitting: Legal Medicine

## 2020-03-24 ENCOUNTER — Other Ambulatory Visit: Payer: Self-pay | Admitting: Family Medicine

## 2020-03-31 DIAGNOSIS — H401133 Primary open-angle glaucoma, bilateral, severe stage: Secondary | ICD-10-CM | POA: Diagnosis not present

## 2020-04-16 ENCOUNTER — Telehealth: Payer: Self-pay

## 2020-04-16 NOTE — Progress Notes (Signed)
Chronic Care Management Pharmacy Assistant   Name: Amber Brown  MRN: [id number] DOB: [birth year]  Reason for Encounter: Disease State call for hypertension.  Patient Questions:  1.  Have you seen any other providers since your last visit? No  2.  Any changes in your medicines or health? No     PCP : Lillard Anes, MD  Allergies:   Allergies  Allergen Reactions   Meperidine Nausea Only    Medications: Outpatient Encounter Medications as of 04/16/2020  Medication Sig   alendronate (FOSAMAX) 70 MG tablet TAKE 1 TABLET BY MOUTH ONCE A WEEK   aspirin 81 MG EC tablet Take 81 mg by mouth daily.    atorvastatin (LIPITOR) 40 MG tablet Take 1 tablet (40 mg total) by mouth daily.   calcium carbonate (OSCAL) 1500 (600 Ca) MG TABS tablet Take 600 mg of elemental calcium by mouth daily with breakfast.   clopidogrel (PLAVIX) 75 MG tablet TAKE 1 TABLET BY MOUTH ONCE DAILY   dorzolamide-timolol (COSOPT) 22.3-6.8 MG/ML ophthalmic solution Place 1 drop into both eyes 2 (two) times daily.    levothyroxine (SYNTHROID) 50 MCG tablet TAKE 1 TABLET BY MOUTH  DAILY   meclizine (ANTIVERT) 25 MG tablet Take 1 tablet (25 mg total) by mouth 3 (three) times daily as needed for dizziness.   metoprolol succinate (TOPROL-XL) 25 MG 24 hr tablet TAKE 1 TABLET BY MOUTH  TWICE A DAY   Multiple Vitamins-Minerals (OCUVITE ADULT FORMULA PO) Take 1 tablet by mouth daily.   mupirocin ointment (BACTROBAN) 2 % Apply 1 application topically 2 (two) times daily.   pantoprazole (PROTONIX) 40 MG tablet TAKE 1 TABLET BY MOUTH 1  TIMES DAILY   ROCKLATAN 0.02-0.005 % SOLN Apply 1 drop to eye at bedtime.   silver sulfADIAZINE (SILVADENE) 1 % cream Apply 1 application topically daily.   No facility-administered encounter medications on file as of 04/16/2020.    Current Diagnosis: Patient Active Problem List   Diagnosis Date Noted   Bursitis of hip, right 10/02/2019   BMI 27.0-27.9,adult  10/02/2019   Hypothyroidism, adult 05/28/2019   Mixed hyperlipidemia 05/28/2019   Adjustment disorder with depressed mood 05/28/2019   Peripheral vascular disease ([place]) 05/28/2019   GERD with esophagitis 05/28/2019   Anemia in stage 3b chronic kidney disease ([place]) 05/28/2019   Osteoporosis of forearm without pathological fracture 05/28/2019   Sequelae of cerebral infarction 05/28/2019   History of amputation of lesser toe of right foot ([place]) 02/28/2018   Deformity, toe acquired, right 01/12/2018   Diminished pulses in lower extremity 02/20/2017   Glaucoma 02/20/2017   Hypertension 02/20/2017   Degenerative scoliosis in adult patient 01/06/2015   Lumbar stenosis with neurogenic claudication 06/30/2014   Bilateral lower extremity pain 01/03/2014   Reviewed chart prior to disease state call. Spoke with patient regarding BP  Recent Office Vitals: BP Readings from Last 3 Encounters:  10/02/19 (!) 166/74  05/28/19 140/72   Pulse Readings from Last 3 Encounters:  10/02/19 70  05/28/19 74    Wt Readings from Last 3 Encounters:  10/02/19 142 lb 3.2 oz (64.5 kg)  05/28/19 147 lb 6.4 oz (66.9 kg)     Kidney Function Lab Results  Component Value Date/Time   CREATININE 0.97 05/28/2019 11:09 AM   GFRNONAA 52 (L) 05/28/2019 11:09 AM   GFRAA 60 05/28/2019 11:09 AM    BMP Latest Ref Rng & Units 05/28/2019  Glucose 65 - 99 mg/dL 126(H)  BUN 8 - 27 mg/dL  14  Creatinine 0.57 - 1.00 mg/dL 0.97  BUN/Creat Ratio 12 - 28 14  Sodium 134 - 144 mmol/L 138  Potassium 3.5 - 5.2 mmol/L 4.8  Chloride 96 - 106 mmol/L 103  CO2 20 - 29 mmol/L 23  Calcium 8.7 - 10.3 mg/dL 9.4     Current antihypertensive regimen:  Metoprolol 25mg  bid   How often are you checking your Blood Pressure? twice daily, patient states at home her reading are usually around 140, She had to go to dentist yesterday to have some work done on her bridge and her readings then were 180 and 190.  She stated  that once before she had an appointment with the eye doctor, her niece was late picking her up and when she arrived at the office her reading was 200.  She doesn't drive anymore, and has to depend on someone else, she hates to be late, she is wondering if she has some white coat syndrome going on.      Patient stated she wonders if her medication is strong enough to get her numbers under control.  She stated she takes her medication everyday, she stated there were no other issues at this time.   Current home BP readings: She had not taken her blood pressure today.    What recent interventions/DTPs have been made by any provider to improve Blood Pressure control since last CPP Visit: Patient states she uses no additional salt in her food, she doesn't see that well but does housework to stay active.    Any recent hospitalizations or ED visits since last visit with CPP? No    What diet changes have been made to improve Blood Pressure Control? Patient watches her salt intake.   What exercise is being done to improve your Blood Pressure Control? Patient has decreased vision, she does her housework to stay active.     Adherence Review: Is the patient currently on ACE/ARB medication? Yes   Does the patient have >5 day gap between last estimated fill dates? No   Follow-Up:  Pharmacist Review  Donette Larry, CPP notified  Clarita Leber, [place] Pharmacist Assistant [phone]

## 2020-05-21 ENCOUNTER — Telehealth: Payer: Self-pay

## 2020-05-21 NOTE — Progress Notes (Signed)
Chronic Care Management Pharmacy Assistant   Name: Amber Brown  MRN: [id number] DOB: Sep 12, 1928  Reason for Encounter: Disease State for general adherence   Patient Questions:  1.  Have you seen any other providers since your last visit? No  2.  Any changes in your medicines or health? No    PCP : Amber Anes, MD  Allergies:   Allergies  Allergen Reactions  . Meperidine Nausea Only    Medications: Outpatient Encounter Medications as of 05/21/2020  Medication Sig  . alendronate (FOSAMAX) 70 MG tablet TAKE 1 TABLET BY MOUTH ONCE A WEEK  . aspirin 81 MG EC tablet Take 81 mg by mouth daily.   Marland Kitchen atorvastatin (LIPITOR) 40 MG tablet Take 1 tablet (40 mg total) by mouth daily.  . calcium carbonate (OSCAL) 1500 (600 Ca) MG TABS tablet Take 600 mg of elemental calcium by mouth daily with breakfast.  . clopidogrel (PLAVIX) 75 MG tablet TAKE 1 TABLET BY MOUTH ONCE DAILY  . dorzolamide-timolol (COSOPT) 22.3-6.8 MG/ML ophthalmic solution Place 1 drop into both eyes 2 (two) times daily.   Marland Kitchen levothyroxine (SYNTHROID) 50 MCG tablet TAKE 1 TABLET BY MOUTH  DAILY  . meclizine (ANTIVERT) 25 MG tablet Take 1 tablet (25 mg total) by mouth 3 (three) times daily as needed for dizziness.  . metoprolol succinate (TOPROL-XL) 25 MG 24 hr tablet TAKE 1 TABLET BY MOUTH  TWICE A DAY  . Multiple Vitamins-Minerals (OCUVITE ADULT FORMULA PO) Take 1 tablet by mouth daily.  . mupirocin ointment (BACTROBAN) 2 % Apply 1 application topically 2 (two) times daily.  . pantoprazole (PROTONIX) 40 MG tablet TAKE 1 TABLET BY MOUTH 1  TIMES DAILY  . ROCKLATAN 0.02-0.005 % SOLN Apply 1 drop to eye at bedtime.  . silver sulfADIAZINE (SILVADENE) 1 % cream Apply 1 application topically daily.   No facility-administered encounter medications on file as of 05/21/2020.    Current Diagnosis: Patient Active Problem List   Diagnosis Date Noted  . Bursitis of hip, right 10/02/2019  . BMI 27.0-27.9,adult 10/02/2019   . Hypothyroidism, adult 05/28/2019  . Mixed hyperlipidemia 05/28/2019  . Adjustment disorder with depressed mood 05/28/2019  . Peripheral vascular disease ([place]) 05/28/2019  . GERD with esophagitis 05/28/2019  . Anemia in stage 3b chronic kidney disease ([place]) 05/28/2019  . Osteoporosis of forearm without pathological fracture 05/28/2019  . Sequelae of cerebral infarction 05/28/2019  . History of amputation of lesser toe of right foot ([place]) 02/28/2018  . Deformity, toe acquired, right 01/12/2018  . Diminished pulses in lower extremity 02/20/2017  . Glaucoma 02/20/2017  . Hypertension 02/20/2017  . Degenerative scoliosis in adult patient 01/06/2015  . Lumbar stenosis with neurogenic claudication 06/30/2014  . Bilateral lower extremity pain 01/03/2014   Spoke with patient, she stated she is doing well overall.    She stated that every time she goes to the doctor or dentist her blood pressure seems to rise up high, and she usually has to sit there for a minute and wait for it to come down.  She doesn't really check it at home.  The patient stated she tries to stay active, but due to limited eye sight she stated she cant do much.   The patient is not really following any special diet, but she uses very little salt.   She stated she has no issues getting her medications, and is not currently having any side effects.  I did confirm her next appointment with Donette Larry,  CPP, on 08/24/20  Follow-Up:  Pharmacist Review  Donette Larry, [place], [place] Pharmacist Assistant [phone]

## 2020-06-04 ENCOUNTER — Telehealth: Payer: Self-pay

## 2020-06-04 ENCOUNTER — Encounter: Payer: Self-pay | Admitting: Legal Medicine

## 2020-06-04 ENCOUNTER — Ambulatory Visit (INDEPENDENT_AMBULATORY_CARE_PROVIDER_SITE_OTHER): Payer: Medicare Other | Admitting: Legal Medicine

## 2020-06-04 ENCOUNTER — Other Ambulatory Visit: Payer: Self-pay

## 2020-06-04 VITALS — BP 138/80 | HR 77 | Temp 97.4°F | Resp 16 | Ht 59.5 in | Wt 136.0 lb

## 2020-06-04 DIAGNOSIS — I10 Essential (primary) hypertension: Secondary | ICD-10-CM | POA: Diagnosis not present

## 2020-06-04 DIAGNOSIS — Z89421 Acquired absence of other right toe(s): Secondary | ICD-10-CM

## 2020-06-04 DIAGNOSIS — I739 Peripheral vascular disease, unspecified: Secondary | ICD-10-CM | POA: Diagnosis not present

## 2020-06-04 DIAGNOSIS — H548 Legal blindness, as defined in USA: Secondary | ICD-10-CM

## 2020-06-04 DIAGNOSIS — E039 Hypothyroidism, unspecified: Secondary | ICD-10-CM | POA: Diagnosis not present

## 2020-06-04 DIAGNOSIS — I6930 Unspecified sequelae of cerebral infarction: Secondary | ICD-10-CM | POA: Diagnosis not present

## 2020-06-04 DIAGNOSIS — E782 Mixed hyperlipidemia: Secondary | ICD-10-CM | POA: Diagnosis not present

## 2020-06-04 DIAGNOSIS — K2100 Gastro-esophageal reflux disease with esophagitis, without bleeding: Secondary | ICD-10-CM

## 2020-06-04 DIAGNOSIS — F0390 Unspecified dementia without behavioral disturbance: Secondary | ICD-10-CM

## 2020-06-04 DIAGNOSIS — F4321 Adjustment disorder with depressed mood: Secondary | ICD-10-CM

## 2020-06-04 MED ORDER — DONEPEZIL HCL 10 MG PO TABS: 10.0000 mg | ORAL_TABLET | Freq: Every day | ORAL | 3 refills | Status: DC

## 2020-06-04 NOTE — Progress Notes (Signed)
Subjective:  Patient ID: Amber Brown, female    DOB: [birth year]  Age: 87 y.o. MRN: [id number]  Chief Complaint  Patient presents with  . Hypertension  . Hypothyroidism  . Hyperlipidemia    HPI: chronic visit  Patient presents for follow up of hypertension.  Patient tolerating metoprlol well with side effects.  Patient was diagnosed with hypertension 2010 so has been treated for hypertension for 10 years.Patient is working on maintaining diet and exercise regimen and follows up as directed. Complication include none.  Patient presents with hyperlipidemia.  Compliance with treatment has been good; patient takes medicines as directed, maintains low cholesterol diet, follows up as directed, and maintains exercise regimen.  Patient is using atorvastatin without problems.  Patient has HYPOTHYROIDISM.  Diagnosed 10 years ago.  Patient has stable thyroid readings.  Patient is having no symptoms.  Last TSH was normal.  continue dosage of thyroid medicine.  She has severe OA right knee and bakers cyst.  She had injections with Dr. Joie Bimler.  Patient is having poor memory.  She is legally blind.   Current Outpatient Medications on File Prior to Visit  Medication Sig Dispense Refill  . alendronate (FOSAMAX) 70 MG tablet TAKE 1 TABLET BY MOUTH ONCE A WEEK 12 tablet 3  . aspirin 81 MG EC tablet Take 81 mg by mouth daily.     Marland Kitchen atorvastatin (LIPITOR) 40 MG tablet Take 1 tablet (40 mg total) by mouth daily. 90 tablet 3  . calcium carbonate (OSCAL) 1500 (600 Ca) MG TABS tablet Take 600 mg of elemental calcium by mouth daily with breakfast.    . clopidogrel (PLAVIX) 75 MG tablet TAKE 1 TABLET BY MOUTH ONCE DAILY 90 tablet 2  . dorzolamide-timolol (COSOPT) 22.3-6.8 MG/ML ophthalmic solution Place 1 drop into both eyes 2 (two) times daily.     Marland Kitchen levothyroxine (SYNTHROID) 50 MCG tablet TAKE 1 TABLET BY MOUTH  DAILY 90 tablet 2  . meclizine (ANTIVERT) 25 MG tablet Take 1 tablet (25 mg total) by mouth 3  (three) times daily as needed for dizziness. 30 tablet 0  . metoprolol succinate (TOPROL-XL) 25 MG 24 hr tablet TAKE 1 TABLET BY MOUTH  TWICE A DAY 180 tablet 2  . Multiple Vitamins-Minerals (OCUVITE ADULT FORMULA PO) Take 1 tablet by mouth daily.    . mupirocin ointment (BACTROBAN) 2 % Apply 1 application topically 2 (two) times daily. 22 g 0  . pantoprazole (PROTONIX) 40 MG tablet TAKE 1 TABLET BY MOUTH 1  TIMES DAILY 90 tablet 3  . ROCKLATAN 0.02-0.005 % SOLN Apply 1 drop to eye at bedtime.     No current facility-administered medications on file prior to visit.   Past Medical History:  Diagnosis Date  . Degenerative scoliosis in adult patient 01/06/2015  . GERD with esophagitis 05/28/2019  . Glaucoma 02/20/2017  . Hypertension 02/20/2017  . Hypothyroidism, adult 05/28/2019  . Mixed hyperlipidemia 05/28/2019  . Osteoporosis of forearm without pathological fracture 05/28/2019  . Peripheral vascular disease ([place]) 05/28/2019  . Sequelae of cerebral infarction 05/28/2019   Past Surgical History:  Procedure Laterality Date  . CHOLECYSTECTOMY      History reviewed. No pertinent family history. Social History   Socioeconomic History  . Marital status: Widowed    Spouse name: Not on file  . Number of children: Not on file  . Years of education: Not on file  . Highest education level: Not on file  Occupational History  . Not on file  Tobacco  Use  . Smoking status: Never Smoker  . Smokeless tobacco: Never Used  Substance and Sexual Activity  . Alcohol use: Never  . Drug use: Never  . Sexual activity: Not Currently  Other Topics Concern  . Not on file  Social History Narrative  . Not on file   Social Determinants of Health   Financial Resource Strain: Not on file  Food Insecurity: No Food Insecurity  . Worried About Charity fundraiser in the Last Year: Never true  . Ran Out of Food in the Last Year: Never true  Transportation Needs: Not on file  Physical Activity: Insufficiently  Active  . Days of Exercise per Week: 5 days  . Minutes of Exercise per Session: 20 min  Stress: Not on file  Social Connections: Not on file    Review of Systems  Constitutional: Negative.  Negative for diaphoresis.  HENT: Negative for ear discharge and sinus pain.   Eyes: Negative for visual disturbance.  Respiratory: Negative for chest tightness and shortness of breath.   Cardiovascular: Negative for chest pain, palpitations and leg swelling.  Gastrointestinal: Negative.  Negative for abdominal distention.  Genitourinary: Negative.  Negative for dysuria and urgency.  Musculoskeletal: Positive for arthralgias. Negative for back pain.  Neurological: Negative.        Blindness  Psychiatric/Behavioral: Positive for confusion.     Objective:  BP 138/80   Pulse 77   Temp (!) 97.4 F (36.3 C)   Resp 16   Ht 4' 11.5" (1.511 m)   Wt 136 lb (61.7 kg)   SpO2 98%   BMI 27.01 kg/m   BP/Weight 06/04/2020 07/27/3265 03/22/4578  Systolic BP 998 338 250  Diastolic BP 80 74 72  Wt. (Lbs) 136 142.2 147.4  BMI 27.01 27.77 26.96    Physical Exam Vitals reviewed.  Constitutional:      Appearance: Normal appearance.  HENT:     Head: Normocephalic.     Right Ear: Tympanic membrane, ear canal and external ear normal.     Left Ear: Tympanic membrane, ear canal and external ear normal.     Mouth/Throat:     Mouth: Mucous membranes are moist.     Pharynx: Oropharynx is clear.  Eyes:     Extraocular Movements: Extraocular movements intact.     Conjunctiva/sclera: Conjunctivae normal.     Pupils: Pupils are equal, round, and reactive to light.  Cardiovascular:     Rate and Rhythm: Normal rate.     Pulses: Normal pulses.     Heart sounds: Normal heart sounds. No murmur heard. No gallop.   Pulmonary:     Effort: Pulmonary effort is normal. No respiratory distress.     Breath sounds: Normal breath sounds. No rales.  Abdominal:     General: Abdomen is flat. Bowel sounds are normal. There  is no distension.     Palpations: Abdomen is soft.     Tenderness: There is no abdominal tenderness.  Musculoskeletal:        General: Tenderness present.     Cervical back: Normal range of motion and neck supple.     Right knee: Swelling and crepitus present. Tenderness present.       Legs:  Skin:    General: Skin is warm and dry.     Capillary Refill: Capillary refill takes less than 2 seconds.  Neurological:     General: No focal deficit present.     Mental Status: She is alert and oriented to person,  place, and time. Mental status is at baseline.  Psychiatric:        Mood and Affect: Mood normal.        Thought Content: Thought content normal.    Depression screen [facility] 2/9 06/04/2020 06/04/2020 06/04/2020 05/29/2019 05/28/2019  Decreased Interest 0 0 0 0 1  Down, Depressed, Hopeless 0 0 0 1 1  PHQ - 2 Score 0 0 0 1 2  Altered sleeping 0 - - 0 -  Tired, decreased energy 0 - - 0 -  Change in appetite 0 - - 1 -  Feeling bad or failure about yourself  0 - - 0 -  Trouble concentrating 0 - - 0 -  Moving slowly or fidgety/restless 0 - - 0 -  Suicidal thoughts 0 - - 1 -  PHQ-9 Score 0 - - 3 -  Difficult doing work/chores Not difficult at all - - - Not difficult at all       Lab Results  Component Value Date   WBC 9.6 05/28/2019   HGB 15.0 05/28/2019   HCT 45.9 05/28/2019   PLT 285 05/28/2019   GLUCOSE 126 (H) 05/28/2019   ALT 12 05/28/2019   AST 22 05/28/2019   NA 138 05/28/2019   K 4.8 05/28/2019   CL 103 05/28/2019   CREATININE 0.97 05/28/2019   BUN 14 05/28/2019   CO2 23 05/28/2019   TSH 2.390 05/28/2019      Assessment & Plan:  Diagnoses and all orders for this visit: Essential hypertension -     Comprehensive metabolic panel -     CBC with Differential/Platelet An individual hypertension care plan was established and reinforced today.  The patient's status was assessed using clinical findings on exam and labs or diagnostic tests. The patient's success at  meeting treatment goals on disease specific evidence-based guidelines and found to be fair controlled. SELF MANAGEMENT: The patient and I together assessed ways to personally work towards obtaining the recommended goals. RECOMMENDATIONS: avoid decongestants found in common cold remedies, decrease consumption of alcohol, perform routine monitoring of BP with home BP cuff, exercise, reduction of dietary salt, take medicines as prescribed, try not to miss doses and quit smoking.  Regular exercise and maintaining a healthy weight is needed.  Stress reduction may help. A CLINICAL SUMMARY including written plan identify barriers to care unique to individual due to social or financial issues.  We attempt to mutually creat solutions for individual and family understanding.  Mixed hyperlipidemia -     Lipid panel AN INDIVIDUAL CARE PLAN for hyperlipidemia/ cholesterol was established and reinforced today.  The patient's status was assessed using clinical findings on exam, lab and other diagnostic tests. The patient's disease status was assessed based on evidence-based guidelines and found to be fair controlled. MEDICATIONS were reviewed. SELF MANAGEMENT GOALS have been discussed and patient's success at attaining the goal of low cholesterol was assessed. RECOMMENDATION given include regular exercise 3 days a week and low cholesterol/low fat diet. CLINICAL SUMMARY including written plan to identify barriers unique to the patient due to social or economic  reasons was discussed.  Hypothyroidism, adult -     TSH Patient is known to have hypothyroidism and is n treatment with levothyroxine 50 mcg.  Patient was diagnosed 10 years ago.  Other treatment includes none.  Patient is compliant with medicines and last TSH 6 months ago.  Last TSH was normal . Gastroesophageal reflux disease with esophagitis without hemorrhage Plan of care was formulated today.  She is doing well.  A plan of care was formulated using  patient exam, tests and other sources to optimize care using evidence based information.  Recommend no smoking, no eating after supper, avoid fatty foods, elevate Head of bed, avoid tight fitting clothing.  Continue on pantoprazole. Peripheral vascular disease ([place]) AN INDIVIDUAL CARE PLAN was established and reinforced today.  The patient's status was assessed using clinical findings on exam, labs, and other diagnostic testing. Patient's success at meeting treatment goals based on disease specific evidence-bassed guidelines and found to be in good control. RECOMMENDATIONS include maintaining present medicines and treatment.  Adjustment disorder with depressed mood Patient's depression is controlled with none.   Anhedonia better .  PHQ 9 was performed score zero. An individual care plan was established or reinforced today.  The patient's disease status was assessed using clinical findings on exam, labs, and or other diagnostic testing to determine patient's success in meeting treatment goals based on disease specific evidence-based guidelines and found to be imroving Recommendations include stay off medicines History of amputation of lesser toe of right foot [facility]) Patient has amputation of toe  Primary hypertension An individual hypertension care plan was established and reinforced today.  The patient's status was assessed using clinical findings on exam and labs or diagnostic tests. The patient's success at meeting treatment goals on disease specific evidence-based guidelines and found to be well controlled. SELF MANAGEMENT: The patient and I together assessed ways to personally work towards obtaining the recommended goals. RECOMMENDATIONS: avoid decongestants found in common cold remedies, decrease consumption of alcohol, perform routine monitoring of BP with home BP cuff, exercise, reduction of dietary salt, take medicines as prescribed, try not to miss doses and quit smoking.  Regular exercise and  maintaining a healthy weight is needed.  Stress reduction may help. A CLINICAL SUMMARY including written plan identify barriers to care unique to individual due to social or financial issues.  We attempt to mutually creat solutions for individual and family understanding.  Legally blind Blindness from eye disease  Sequelae of cerebral infarction -     CT Head Wo Contrast Patient has remained with some confusion after infaction  Dementia without behavioral disturbance, unspecified dementia type (HCC) -     ANA -     RPR -     donepezil (ARICEPT) 10 MG tablet; Take 1 tablet (10 mg total) by mouth at bedtime. Patient has MMSE 20/28    Orders Placed This Encounter  Procedures  . CT Head Wo Contrast  . Comprehensive metabolic panel  . Lipid panel  . CBC with Differential/Platelet  . TSH  . ANA  . RPR      I spent 35 minutes dedicated to the care of this patient on the date of this encounter to include face-to-face time with the patient, as well as:  Follow-up: Return in about 2 months (around 08/04/2020) for memory loss.  An After Visit Summary was printed and given to the patient.  [name], MD [facility] [phone]

## 2020-06-04 NOTE — Assessment & Plan Note (Signed)
She had eye problems

## 2020-06-04 NOTE — Telephone Encounter (Signed)
   Amber Brown has been scheduled for the following appointment:  WHAT: CT Brain w/o contrast WHERE: [facility] DATE: 06/09/2020 TIME: 12:30pm  Left a voicemail for patient with information of her appointment an to give [place] a call back with any questions or concerns.

## 2020-06-05 LAB — CBC WITH DIFFERENTIAL/PLATELET
Basophils Absolute: 0.1 10*3/uL (ref 0.0–0.2)
Basos: 1 %
EOS (ABSOLUTE): 0.1 10*3/uL (ref 0.0–0.4)
Eos: 1 %
Hematocrit: 42.5 % (ref 34.0–46.6)
Hemoglobin: 14.3 g/dL (ref 11.1–15.9)
Immature Grans (Abs): 0 10*3/uL (ref 0.0–0.1)
Immature Granulocytes: 0 %
Lymphocytes Absolute: 2.1 10*3/uL (ref 0.7–3.1)
Lymphs: 22 %
MCH: 30.5 pg (ref 26.6–33.0)
MCHC: 33.6 g/dL (ref 31.5–35.7)
MCV: 91 fL (ref 79–97)
Monocytes Absolute: 0.8 10*3/uL (ref 0.1–0.9)
Monocytes: 9 %
Neutrophils Absolute: 6.2 10*3/uL (ref 1.4–7.0)
Neutrophils: 67 %
Platelets: 277 10*3/uL (ref 150–450)
RBC: 4.69 x10E6/uL (ref 3.77–5.28)
RDW: 12.5 % (ref 11.7–15.4)
WBC: 9.2 10*3/uL (ref 3.4–10.8)

## 2020-06-05 LAB — COMPREHENSIVE METABOLIC PANEL
ALT: 11 IU/L (ref 0–32)
AST: 19 IU/L (ref 0–40)
Albumin/Globulin Ratio: 1.6 (ref 1.2–2.2)
Albumin: 3.9 g/dL (ref 3.6–4.6)
Alkaline Phosphatase: 77 IU/L (ref 44–121)
BUN/Creatinine Ratio: 16 (ref 12–28)
BUN: 15 mg/dL (ref 8–27)
Bilirubin Total: 0.7 mg/dL (ref 0.0–1.2)
CO2: 21 mmol/L (ref 20–29)
Calcium: 9.6 mg/dL (ref 8.7–10.3)
Chloride: 102 mmol/L (ref 96–106)
Creatinine, Ser: 0.92 mg/dL (ref 0.57–1.00)
Globulin, Total: 2.5 g/dL (ref 1.5–4.5)
Glucose: 125 mg/dL — ABNORMAL HIGH (ref 65–99)
Potassium: 4.3 mmol/L (ref 3.5–5.2)
Sodium: 140 mmol/L (ref 134–144)
Total Protein: 6.4 g/dL (ref 6.0–8.5)
eGFR: 60 mL/min/{1.73_m2} (ref >59)

## 2020-06-05 LAB — LIPID PANEL
Chol/HDL Ratio: 2.1 ratio (ref 0.0–4.4)
Cholesterol, Total: 122 mg/dL (ref 100–199)
HDL: 58 mg/dL (ref >39)
LDL Chol Calc (NIH): 47 mg/dL (ref 0–99)
Triglycerides: 92 mg/dL (ref 0–149)
VLDL Cholesterol Cal: 17 mg/dL (ref 5–40)

## 2020-06-05 LAB — CARDIOVASCULAR RISK ASSESSMENT

## 2020-06-05 LAB — RPR: RPR Ser Ql: NONREACTIVE

## 2020-06-05 LAB — TSH: TSH: 2.41 u[IU]/mL (ref 0.450–4.500)

## 2020-06-05 LAB — ANA: Anti Nuclear Antibody (ANA): NEGATIVE

## 2020-06-05 NOTE — Progress Notes (Signed)
Glucose 125, kidney and liver tests normal, cholesterol normal, CBC normal, TSH 2.4 normal lp

## 2020-06-07 NOTE — Progress Notes (Signed)
ANA negative, RPR negative- no treatable causes for dementia found lp

## 2020-06-08 ENCOUNTER — Telehealth: Payer: Self-pay

## 2020-06-08 ENCOUNTER — Other Ambulatory Visit: Payer: Self-pay | Admitting: Legal Medicine

## 2020-06-08 DIAGNOSIS — F4321 Adjustment disorder with depressed mood: Secondary | ICD-10-CM

## 2020-06-08 MED ORDER — SERTRALINE HCL 25 MG PO TABS: 25.0000 mg | ORAL_TABLET | Freq: Every day | ORAL | 3 refills | Status: DC

## 2020-06-08 NOTE — Telephone Encounter (Signed)
Remember patient denied depression and filled out her depression form as zero. She is under terrible situational stressors.  We can try antidepressant and see if this helps. lp

## 2020-06-08 NOTE — Telephone Encounter (Signed)
Amber Brown, patients POA called about patients lab results. She states depression was discussed at last OV and  patient is still showing symptoms of depression. Amber Brown states she sits around all day worrying about her son that is in the nursing home and does not go anywhere. Amber Brown is asking if patient can have something for depression.

## 2020-06-09 DIAGNOSIS — I6930 Unspecified sequelae of cerebral infarction: Secondary | ICD-10-CM | POA: Diagnosis not present

## 2020-06-09 DIAGNOSIS — G9389 Other specified disorders of brain: Secondary | ICD-10-CM | POA: Diagnosis not present

## 2020-06-09 DIAGNOSIS — I6931 Attention and concentration deficit following cerebral infarction: Secondary | ICD-10-CM | POA: Diagnosis not present

## 2020-06-15 ENCOUNTER — Other Ambulatory Visit: Payer: Self-pay

## 2020-06-15 DIAGNOSIS — I739 Peripheral vascular disease, unspecified: Secondary | ICD-10-CM

## 2020-06-15 DIAGNOSIS — E039 Hypothyroidism, unspecified: Secondary | ICD-10-CM

## 2020-06-15 MED ORDER — LEVOTHYROXINE SODIUM 50 MCG PO TABS: 50.0000 ug | ORAL_TABLET | Freq: Every day | ORAL | 2 refills | Status: DC

## 2020-06-15 MED ORDER — METOPROLOL SUCCINATE ER 25 MG PO TB24: 25.0000 mg | ORAL_TABLET | Freq: Two times a day (BID) | ORAL | 2 refills | Status: DC

## 2020-06-15 MED ORDER — CLOPIDOGREL BISULFATE 75 MG PO TABS: 75.0000 mg | ORAL_TABLET | Freq: Every day | ORAL | 2 refills | Status: DC

## 2020-07-15 DIAGNOSIS — M7121 Synovial cyst of popliteal space [Baker], right knee: Secondary | ICD-10-CM | POA: Diagnosis not present

## 2020-07-15 DIAGNOSIS — M25561 Pain in right knee: Secondary | ICD-10-CM | POA: Diagnosis not present

## 2020-07-15 DIAGNOSIS — M1711 Unilateral primary osteoarthritis, right knee: Secondary | ICD-10-CM | POA: Diagnosis not present

## 2020-07-15 DIAGNOSIS — G8929 Other chronic pain: Secondary | ICD-10-CM | POA: Diagnosis not present

## 2020-07-31 DIAGNOSIS — H401133 Primary open-angle glaucoma, bilateral, severe stage: Secondary | ICD-10-CM | POA: Diagnosis not present

## 2020-08-04 ENCOUNTER — Ambulatory Visit: Payer: Medicare Other | Admitting: Legal Medicine

## 2020-08-12 ENCOUNTER — Other Ambulatory Visit: Payer: Self-pay | Admitting: Legal Medicine

## 2020-08-20 NOTE — Progress Notes (Signed)
Chronic Care Management Pharmacy Note  08/25/2020 Name:  Amber Brown MRN:  [id number] DOB:  Jan 26, 1929  Summary:  Patient reports that she was unable to tolerate donepezil. It made her very nauseated. She tried it 2 different times and was unable to tolerate. She denies concerns with memory.   Patient reports and pharmacy confirms that she never picked up or began sertraline. She denies the need for medication. States she is sad about her son in nursing home and misses her husband but denies the need for medication.   Patient is requesting refill for silver sulfadiazine 1% cream - pharmacist reached out to pharmacy to request fill. Patient had available refills.   Plan:  Continue current medications.   Subjective: Amber Brown is an 87 y.o. year old female who is a primary patient of Henrene Pastor, Zeb Comfort, MD.  The CCM team was consulted for assistance with disease management and care coordination needs.    Engaged with patient by telephone for follow up visit in response to provider referral for pharmacy case management and/or care coordination services.   Consent to Services:  The patient was given information about Chronic Care Management services, agreed to services, and gave verbal consent prior to initiation of services.  Please see initial visit note for detailed documentation.   Patient Care Team: Lillard Anes, MD as PCP - General (Family Medicine) Burnice Logan, [facility] as Pharmacist (Pharmacist)  Recent office visits: 06/04/2020 - MMSE 20/28. Aricept 10 mg daily at bedtime. ANA negative. RPR negative. No treatable causes for dementia found. Normal kidney and liver tests. Cholesterol normal. CBC normal. TSH 2.4. Glucose 125.  Recent consult visits: Patient sees eye doctor regularly.   Hospital visits: None in previous 6 months   Objective:  Lab Results  Component Value Date   CREATININE 0.92 06/04/2020   BUN 15 06/04/2020   GFRNONAA 52 (L) 05/28/2019    GFRAA 60 05/28/2019   NA 140 06/04/2020   K 4.3 06/04/2020   CALCIUM 9.6 06/04/2020   CO2 21 06/04/2020   GLUCOSE 125 (H) 06/04/2020    No results found for: HGBA1C, FRUCTOSAMINE, GFR, MICROALBUR  Last diabetic Eye exam: No results found for: HMDIABEYEEXA  Last diabetic Foot exam: No results found for: HMDIABFOOTEX   Lab Results  Component Value Date   CHOL 122 06/04/2020   HDL 58 06/04/2020   LDLCALC 47 06/04/2020   TRIG 92 06/04/2020   CHOLHDL 2.1 06/04/2020    Hepatic Function Latest Ref Rng & Units 06/04/2020 05/28/2019  Total Protein 6.0 - 8.5 g/dL 6.4 7.0  Albumin 3.6 - 4.6 g/dL 3.9 4.2  AST 0 - 40 IU/L 19 22  ALT 0 - 32 IU/L 11 12  Alk Phosphatase 44 - 121 IU/L 77 76  Total Bilirubin 0.0 - 1.2 mg/dL 0.7 0.7    Lab Results  Component Value Date/Time   TSH 2.410 06/04/2020 09:34 AM   TSH 2.390 05/28/2019 11:09 AM    CBC Latest Ref Rng & Units 06/04/2020 05/28/2019  WBC 3.4 - 10.8 x10E3/uL 9.2 9.6  Hemoglobin 11.1 - 15.9 g/dL 14.3 15.0  Hematocrit 34.0 - 46.6 % 42.5 45.9  Platelets 150 - 450 x10E3/uL 277 285    No results found for: VD25OH  Clinical ASCVD: No  The ASCVD Risk score Mikey Bussing DC Jr., et al., 2013) failed to calculate for the following reasons:   The 2013 ASCVD risk score is only valid for ages 18 to 58  Depression screen [facility] 2/9 06/04/2020 06/04/2020 06/04/2020  Decreased Interest 0 0 0  Down, Depressed, Hopeless 0 0 0  PHQ - 2 Score 0 0 0  Altered sleeping 0 - -  Tired, decreased energy 0 - -  Change in appetite 0 - -  Feeling bad or failure about yourself  0 - -  Trouble concentrating 0 - -  Moving slowly or fidgety/restless 0 - -  Suicidal thoughts 0 - -  PHQ-9 Score 0 - -  Difficult doing work/chores Not difficult at all - -     Other: (CHADS2VASc if Afib, MMRC or CAT for COPD, ACT, DEXA)  Social History   Tobacco Use  Smoking Status Never Smoker  Smokeless Tobacco Never Used   BP Readings from Last 3 Encounters:  06/04/20 138/80   10/02/19 (!) 166/74  05/28/19 140/72   Pulse Readings from Last 3 Encounters:  06/04/20 77  10/02/19 70  05/28/19 74   Wt Readings from Last 3 Encounters:  06/04/20 136 lb (61.7 kg)  10/02/19 142 lb 3.2 oz (64.5 kg)  05/28/19 147 lb 6.4 oz (66.9 kg)   BMI Readings from Last 3 Encounters:  06/04/20 27.01 kg/m  10/02/19 27.77 kg/m  05/28/19 26.96 kg/m    Assessment/Interventions: Review of patient past medical history, allergies, medications, health status, including review of consultants reports, laboratory and other test data, was performed as part of comprehensive evaluation and provision of chronic care management services.   SDOH:  (Social Determinants of Health) assessments and interventions performed: Yes  SDOH Screenings   Alcohol Screen: Not on file  Depression (PHQ2-9): Low Risk   . PHQ-2 Score: 0  Financial Resource Strain: Not on file  Food Insecurity: Not on file  Housing: Low Risk   . Last Housing Risk Score: 0  Physical Activity: Not on file  Social Connections: Not on file  Stress: Not on file  Tobacco Use: Low Risk   . Smoking Tobacco Use: Never Smoker  . Smokeless Tobacco Use: Never Used  Transportation Needs: Not on file    CCM Care Plan  Allergies  Allergen Reactions  . Meperidine Nausea Only    Medications Reviewed Today    Reviewed by Lillard Anes, MD (Physician) on 06/04/20 at [place] List Status: <None>  Medication Order Taking? Sig Documenting Provider Last Dose Status Informant  alendronate (FOSAMAX) 70 MG tablet [id number] Yes TAKE 1 TABLET BY MOUTH ONCE A WEEK Lillard Anes, MD Taking Active   aspirin 81 MG EC tablet [id number] Yes Take 81 mg by mouth daily.  [provider] Taking Active Self  atorvastatin (LIPITOR) 40 MG tablet [id number] Yes Take 1 tablet (40 mg total) by mouth daily. Lillard Anes, MD Taking Active   calcium carbonate (OSCAL) 1500 (600 Ca) MG TABS tablet [id number] Yes Take 600 mg  of elemental calcium by mouth daily with breakfast. [provider] Taking Active   clopidogrel (PLAVIX) 75 MG tablet [id number] Yes TAKE 1 TABLET BY MOUTH ONCE DAILY Lillard Anes, MD Taking Active   dorzolamide-timolol (COSOPT) 22.3-6.8 MG/ML ophthalmic solution [id number] Yes Place 1 drop into both eyes 2 (two) times daily.  [provider] Taking Active Self  levothyroxine (SYNTHROID) 50 MCG tablet [id number] Yes TAKE 1 TABLET BY MOUTH  DAILY Lillard Anes, MD Taking Active   meclizine (ANTIVERT) 25 MG tablet [id number] Yes Take 1 tablet (25 mg total) by mouth 3 (three) times daily as needed for dizziness. Lillard Anes,  MD Taking Active   metoprolol succinate (TOPROL-XL) 25 MG 24 hr tablet [id number] Yes TAKE 1 TABLET BY MOUTH  TWICE A DAY Lillard Anes, MD Taking Active   Multiple Vitamins-Minerals (OCUVITE ADULT FORMULA PO) [id number] Yes Take 1 tablet by mouth daily. [provider] Taking Active   mupirocin ointment (BACTROBAN) 2 % [id number] Yes Apply 1 application topically 2 (two) times daily. Lillard Anes, MD Taking Active   pantoprazole (PROTONIX) 40 MG tablet [id number] Yes TAKE 1 TABLET BY MOUTH 1  TIMES DAILY [place]um, Rex Kras, MD Taking Active   ROCKLATAN 0.02-0.005 % SOLN [id number] Yes Apply 1 drop to eye at bedtime. [provider] Taking Active Self          Patient Active Problem List   Diagnosis Date Noted  . Legally blind 06/04/2020  . Bursitis of hip, right 10/02/2019  . BMI 27.0-27.9,adult 10/02/2019  . Hypothyroidism, adult 05/28/2019  . Mixed hyperlipidemia 05/28/2019  . Adjustment disorder with depressed mood 05/28/2019  . Peripheral vascular disease ([place]) 05/28/2019  . GERD with esophagitis 05/28/2019  . Anemia in stage 3b chronic kidney disease ([place]) 05/28/2019  . Osteoporosis of forearm without pathological fracture 05/28/2019  . Sequelae of cerebral infarction 05/28/2019  . History of amputation  of lesser toe of right foot ([place]) 02/28/2018  . Deformity, toe acquired, right 01/12/2018  . Diminished pulses in lower extremity 02/20/2017  . Glaucoma 02/20/2017  . Hypertension 02/20/2017  . Degenerative scoliosis in adult patient 01/06/2015  . Lumbar stenosis with neurogenic claudication 06/30/2014  . Bilateral lower extremity pain 01/03/2014    Immunization History  Administered Date(s) Administered  . Influenza-Unspecified 01/27/2020  . PFIZER(Purple Top)SARS-COV-2 Vaccination 05/13/2019, 06/03/2019  . Pneumococcal Conjugate-13 12/11/2017  . Pneumococcal Polysaccharide-23 12/22/2010  . Zoster Recombinat (Shingrix) 04/26/2018  . Zoster, Live 01/10/2018    Conditions to be addressed/monitored:  Hypertension, Hyperlipidemia, GERD, Hypothyroidism, Depression and Osteoporosis  Care Plan : CCM Pharmacy Care Plan  Updates made by Burnice Logan, RPH since 08/25/2020 12:00 AM    Problem: htn, hld, osteoporosis   Priority: High  Onset Date: 08/25/2020    Long-Range Goal: Disease State Management   Start Date: 08/25/2020  Expected End Date: 08/25/2021  This Visit's Progress: On track  Priority: High  Note:     Current Barriers:  . Unable to self administer medications as prescribed  Pharmacist Clinical Goal(s):  Marland Kitchen Patient will achieve ability to self administer medications as prescribed through use of pill box as evidenced by patient report through collaboration with PharmD and provider.   Interventions: . 1:1 collaboration with Lillard Anes, MD regarding development and update of comprehensive plan of care as evidenced by provider attestation and co-signature . Inter-disciplinary care team collaboration (see longitudinal plan of care) . Comprehensive medication review performed; medication list updated in electronic medical record  Hypertension (BP goal <140/90) -Controlled -Current treatment: . metoprolol succinate 25 mg twice daily  -Medications previously tried:  none reported  -Current home readings: patient unable to see to check but reports well controlled when checked -Current dietary habits: patient reports that her impaired vision limits her options  -Current exercise habits: walks around house and yard -Denies hypotensive/hypertensive symptoms -Educated on BP goals and benefits of medications for prevention of heart attack, stroke and kidney damage; Daily salt intake goal < 2300 mg; -Counseled to monitor BP at home as needed, document, and provide log at future appointments -Counseled on diet and exercise extensively Recommended to continue current medication  Hyperlipidemia: (LDL goal < 100) -Controlled -Current treatment: . Atorvastatin 40 mg daily  . Aspirin 81 mg ec daily  -Medications previously tried: none reported   -Current dietary patterns: patient unable to cook due to sight -Current exercise habits: walks around house and yard -Educated on Cholesterol goals;  Benefits of statin for ASCVD risk reduction; -Counseled on diet and exercise extensively Recommended to continue current medication  Osteoporosis / Osteopenia (Goal reduce risk of fracture) -Not ideally controlled -Last DEXA Scan: 09/19/2018  T-Score femoral neck: -2.8  T-Score forearm radius: -4.0  -Patient is a candidate for pharmacologic treatment due to T-Score < -2.5 in femoral neck -Current treatment  . alendronate 70 mg weekly  . Calcium carbonate daily with breakfast -Medications previously tried: recommended Prolia but cost limited care. Monitoring for funds available to help with cost.  -Recommend [phone] units of vitamin D daily. Recommend 1200 mg of calcium daily from dietary and supplemental sources. Counseled on oral bisphosphonate administration: take in the morning, 30 minutes prior to food with 6-8 oz of water. Do not lie down for at least 30 minutes after taking. Recommend weight-bearing and muscle strengthening exercises for building and  maintaining bone density. -Counseled on diet and exercise extensively Recommended to continue current medication Assessed opportunity to make Prolia more affordable.   Hypothyroidism (Goal: manage TSH) -Controlled -Current treatment  . Levothyroxine 50 mcg daily  -Medications previously tried: none reported  -Recommended to continue current medication   Patient Goals/Self-Care Activities . Patient will:  - take medications as prescribed focus on medication adherence by using pill box  Follow Up Plan: Telephone follow up appointment with care management team member scheduled for: 08/2021      Medication Assistance: None required.  Patient affirms current coverage meets needs.  Compliance/Adherence/Medication fill history: Care Gaps: Patient needs Annual Wellness Visit for 2022  Star-Rating Drugs: Atorvastatin 40 mg  90 day supply filled from [facility] 06/2020  Patient's preferred pharmacy is:  [facility]  ([place]) - [place], [address], [address] [place], [place] [address] Phone: [phone] Fax: [phone]  [facility] [place], [place] - [address] AT [address] [place] [address] Phone: [phone] Fax: [phone]  Uses pill box? Yes Pt endorses good compliance  We discussed: Current pharmacy is preferred with insurance plan and patient is satisfied with pharmacy services Patient decided to: Continue current medication management strategy  Care Plan and Follow Up Patient Decision:  Patient agrees to Care Plan and Follow-up.  Plan: Telephone follow up appointment with care management team member scheduled for:  08/2021

## 2020-08-21 DIAGNOSIS — M7121 Synovial cyst of popliteal space [Baker], right knee: Secondary | ICD-10-CM | POA: Diagnosis not present

## 2020-08-24 ENCOUNTER — Ambulatory Visit (INDEPENDENT_AMBULATORY_CARE_PROVIDER_SITE_OTHER): Payer: Medicare Other

## 2020-08-24 DIAGNOSIS — M810 Age-related osteoporosis without current pathological fracture: Secondary | ICD-10-CM | POA: Diagnosis not present

## 2020-08-24 DIAGNOSIS — I10 Essential (primary) hypertension: Secondary | ICD-10-CM | POA: Diagnosis not present

## 2020-08-24 DIAGNOSIS — E782 Mixed hyperlipidemia: Secondary | ICD-10-CM

## 2020-08-25 NOTE — Patient Instructions (Addendum)
Visit Information  Goals Addressed            This Visit's Progress   . Learn More About My Health       Timeframe:  Long-Range Goal Priority:  High Start Date:                             Expected End Date:                        Follow Up Date 08/2021    - tell my story and reason for my visit - make a list of questions - ask questions - repeat what I heard to make sure I understand - bring a list of my medicines to the visit - speak up when I don't understand    Why is this important?    The best way to learn about your health and care is by talking to the doctor and nurse.   They will answer your questions and give you information in the way that you like best.    Notes:     Marland Kitchen Manage My Medicine       Timeframe:  Long-Range Goal Priority:  High Start Date:                             Expected End Date:                       Follow Up Date 08/2021    - call for medicine refill 2 or 3 days before it runs out    Why is this important?   . These steps will help you keep on track with your medicines.   Notes:     . Prevent Falls and Broken Bones-Osteoporosis       Timeframe:  Long-Range Goal Priority:  High Start Date:                             Expected End Date:                       Follow Up Date 08/2021    - always use handrails on the stairs - get at least 10 minutes of activity every day    Why is this important?    When you fall, there are 3 things that control if a bone breaks or not.   These are the fall itself, how hard and the direction that you fall and how fragile your bones are.   Preventing falls is very important for you because of fragile bones.     Notes:       Patient Care Plan: CCM Pharmacy Care Plan    Problem Identified: htn, hld, osteoporosis   Priority: High  Onset Date: 08/25/2020    Long-Range Goal: Disease State Management   Start Date: 08/25/2020  Expected End Date: 08/25/2021  This Visit's Progress: On track   Priority: High  Note:     Current Barriers:  . Unable to self administer medications as prescribed  Pharmacist Clinical Goal(s):  Marland Kitchen Patient will achieve ability to self administer medications as prescribed through use of pill box as evidenced by patient report through collaboration with PharmD and provider.   Interventions: . 1:1 collaboration with Lillard Anes,  MD regarding development and update of comprehensive plan of care as evidenced by provider attestation and co-signature . Inter-disciplinary care team collaboration (see longitudinal plan of care) . Comprehensive medication review performed; medication list updated in electronic medical record  Hypertension (BP goal <140/90) -Controlled -Current treatment: . metoprolol succinate 25 mg twice daily  -Medications previously tried: none reported  -Current home readings: patient unable to see to check but reports well controlled when checked -Current dietary habits: patient reports that her impaired vision limits her options  -Current exercise habits: walks around house and yard -Denies hypotensive/hypertensive symptoms -Educated on BP goals and benefits of medications for prevention of heart attack, stroke and kidney damage; Daily salt intake goal < 2300 mg; -Counseled to monitor BP at home as needed, document, and provide log at future appointments -Counseled on diet and exercise extensively Recommended to continue current medication  Hyperlipidemia: (LDL goal < 100) -Controlled -Current treatment: . Atorvastatin 40 mg daily  . Aspirin 81 mg ec daily  -Medications previously tried: none reported   -Current dietary patterns: patient unable to cook due to sight -Current exercise habits: walks around house and yard -Educated on Cholesterol goals;  Benefits of statin for ASCVD risk reduction; -Counseled on diet and exercise extensively Recommended to continue current medication  Osteoporosis / Osteopenia (Goal  reduce risk of fracture) -Not ideally controlled -Last DEXA Scan: 09/19/2018  T-Score femoral neck: -2.8  T-Score forearm radius: -4.0  -Patient is a candidate for pharmacologic treatment due to T-Score < -2.5 in femoral neck -Current treatment  . alendronate 70 mg weekly  . Calcium carbonate daily with breakfast -Medications previously tried: recommended Prolia but cost limited care. Monitoring for funds available to help with cost.  -Recommend [phone] units of vitamin D daily. Recommend 1200 mg of calcium daily from dietary and supplemental sources. Counseled on oral bisphosphonate administration: take in the morning, 30 minutes prior to food with 6-8 oz of water. Do not lie down for at least 30 minutes after taking. Recommend weight-bearing and muscle strengthening exercises for building and maintaining bone density. -Counseled on diet and exercise extensively Recommended to continue current medication Assessed opportunity to make Prolia more affordable.   Hypothyroidism (Goal: manage TSH) -Controlled -Current treatment  . Levothyroxine 50 mcg daily  -Medications previously tried: none reported  -Recommended to continue current medication   Patient Goals/Self-Care Activities . Patient will:  - take medications as prescribed focus on medication adherence by using pill box  Follow Up Plan: Telephone follow up appointment with care management team member scheduled for: 08/2021      The patient verbalized understanding of instructions, educational materials, and care plan provided today and declined offer to receive copy of patient instructions, educational materials, and care plan.  Telephone follow up appointment with pharmacy team member scheduled for: 08/2021  Burnice Logan, [facility]  Eating Plan for Osteoporosis Osteoporosis causes your bones to become weak and brittle. This puts you at greater risk for bone breaks (fractures) from small bumps or falls. Making changes to your  diet and increasing your physical activity can help strengthen your bones and improve your overall health. Calcium and vitamin D are nutrients that play an important role in bone health. Vitamin D helps your body use calcium and strengthen bones. It is important to get enough calcium and vitamin D as part of your eating plan for osteoporosis. What are tips for following this plan? Reading food labels  Try to get at least 1,000 milligrams (mg) of  calcium each day.  Look for foods that have at least 50 mg of calcium per serving.  Talk with your health care provider about taking a calcium supplement if you do not get enough calcium from food.  Do not have more than 2,500 mg of calcium each day. This is the upper limit for food and nutritional supplements combined. Too much calcium may cause constipation and prevent you from absorbing other important nutrients.  Choose foods that contain vitamin D.  Take a daily vitamin supplement that contains 800-1,000 international units (IU) of vitamin D. The amount may be different depending on your age, body weight, and where you live. Talk with your dietitian or health care provider about how much vitamin D is right for you.  Avoid foods that have more than 300 mg of sodium per serving. Too much sodium can cause your body to lose calcium.  Talk with your dietitian or health care provider about how much sodium you are allowed each day. Shopping  Do not buy foods with added salt, including: ? Salted snacks. ? Angie Fava. ? Canned soups. ? Canned meats. ? Processed meats, such as bacon or precooked or cured meat like sausages or meat loaves. ? Smoked fish. Meal planning  Eat balanced meals that contain protein foods, fruits and vegetables, and foods rich in calcium and vitamin D.  Eat at least 5 servings of fruits and vegetables each day.  Eat 5-6 oz (142-170 g) of lean meat, poultry, fish, eggs, or beans each day. Lifestyle  Do not use any  products that contain nicotine or tobacco, such as cigarettes, e-cigarettes, and chewing tobacco. If you need help quitting, ask your health care provider.  If your health care provider recommends that you lose weight: ? Work with a dietitian to develop an eating plan that will help you reach your desired weight goal. ? Exercise for at least 30 minutes a day, 5 or more days a week, or as told by your health care provider.  Work with a physical therapist to develop an exercise plan that includes flexibility, balance, and strength exercises. Do not focus only on aerobic exercise.  Do not drink alcohol if: ? Your health care provider tells you not to drink. ? You are pregnant, may be pregnant, or are planning to become pregnant.  If you drink alcohol: ? Limit how much you use to:  0-1 drink a day for women.  0-2 drinks a day for men. ? Be aware of how much alcohol is in your drink. In the [place] one drink equals one 12 oz bottle of beer (355 mL), one 5 oz glass of wine (148 mL), or one 1 oz glass of hard liquor (44 mL). What foods should I eat? Foods high in calcium  Yogurt. Yogurt with fruit.  Milk. Evaporated skim milk. Dry milk powder.  Calcium-fortified orange juice.  Parmesan cheese. Part-skim ricotta cheese. Natural hard cheese. Cream cheese. Cottage cheese.  Canned sardines. Canned salmon.  Calcium-treated tofu. Calcium-fortified cereal bar. Calcium-fortified cereal. Calcium-fortified graham crackers.  Cooked collard greens. Turnip greens. Broccoli. Kale.  Almonds.  White beans.  Corn tortilla.   Foods high in vitamin D  Cod liver oil. Fatty fish, such as tuna, mackerel, and salmon.  Milk. Fortified soy milk. Fortified fruit juice.  Yogurt. Margarine.  Egg yolks. Foods high in protein  Beef. Lamb. Pork tenderloin.  Chicken breast.  Tuna (canned). Fish fillet.  Tofu.  Cooked soy beans. Soy patty. Beans (canned or cooked).  [employer]  cheese.  Yogurt.  Peanut butter.  Pumpkin seeds. Nuts. Sunflower seeds.  Hard cheese.  Milk or other milk products, such as soy milk. The items listed above may not be a complete list of foods and beverages you can eat. Contact a dietitian for more options. Summary  Calcium and vitamin D are nutrients that play an important role in bone health and are an important part of your eating plan for osteoporosis.  Eat balanced meals that contain protein foods, fruits and vegetables, and foods rich in calcium and vitamin D.  Avoid foods that have more than 300 mg of sodium per serving. Too much sodium can cause your body to lose calcium.  Exercise is an important part of prevention and treatment of osteoporosis. Aim for at least 30 minutes a day, 5 days a week. This information is not intended to replace advice given to you by your health care provider. Make sure you discuss any questions you have with your health care provider. Document Revised: 08/22/2019 Document Reviewed: 08/22/2019 Elsevier Patient Education  [place].

## 2020-08-31 ENCOUNTER — Other Ambulatory Visit: Payer: Self-pay

## 2020-08-31 ENCOUNTER — Ambulatory Visit (INDEPENDENT_AMBULATORY_CARE_PROVIDER_SITE_OTHER): Payer: Medicare Other | Admitting: Legal Medicine

## 2020-08-31 ENCOUNTER — Encounter: Payer: Self-pay | Admitting: Legal Medicine

## 2020-08-31 DIAGNOSIS — M7072 Other bursitis of hip, left hip: Secondary | ICD-10-CM | POA: Diagnosis not present

## 2020-08-31 NOTE — Progress Notes (Signed)
Established Patient Office Visit  Subjective:  Patient ID: Amber Brown, female    DOB: 1928-07-16  Age: 87 y.o. MRN: [id number]  CC:  Chief Complaint  Patient presents with   Hip Pain    Left hip pain since almost one week.    HPI Amber Brown presents for left hip pain for 3 days.  Can walk but pain in back and gluteal area.  She has chronic LBP. She requests a kenalog shot.  Nausea on donazepril. Stop.  Past Medical History:  Diagnosis Date   Degenerative scoliosis in adult patient 01/06/2015   GERD with esophagitis 05/28/2019   Glaucoma 02/20/2017   Hypertension 02/20/2017   Hypothyroidism, adult 05/28/2019   Mixed hyperlipidemia 05/28/2019   Osteoporosis of forearm without pathological fracture 05/28/2019   Peripheral vascular disease ([place]) 05/28/2019   Sequelae of cerebral infarction 05/28/2019    Past Surgical History:  Procedure Laterality Date   CHOLECYSTECTOMY      History reviewed. No pertinent family history.  Social History   Socioeconomic History   Marital status: Widowed    Spouse name: Not on file   Number of children: Not on file   Years of education: Not on file   Highest education level: Not on file  Occupational History   Not on file  Tobacco Use   Smoking status: Never   Smokeless tobacco: Never  Substance and Sexual Activity   Alcohol use: Never   Drug use: Never   Sexual activity: Not Currently  Other Topics Concern   Not on file  Social History Narrative   Not on file   Social Determinants of Health   Financial Resource Strain: Not on file  Food Insecurity: Not on file  Transportation Needs: Not on file  Physical Activity: Not on file  Stress: Not on file  Social Connections: Not on file  Intimate Partner Violence: Not on file    Outpatient Medications Prior to Visit  Medication Sig Dispense Refill   alendronate (FOSAMAX) 70 MG tablet TAKE 1 TABLET BY MOUTH ONCE A WEEK 12 tablet 3   aspirin 81 MG EC tablet Take 81 mg by mouth daily.       atorvastatin (LIPITOR) 40 MG tablet TAKE 1 TABLET BY MOUTH  DAILY 90 tablet 1   calcium carbonate (OSCAL) 1500 (600 Ca) MG TABS tablet Take 600 mg of elemental calcium by mouth daily with breakfast.     clopidogrel (PLAVIX) 75 MG tablet Take 1 tablet (75 mg total) by mouth daily. 90 tablet 2   dorzolamide-timolol (COSOPT) 22.3-6.8 MG/ML ophthalmic solution Place 1 drop into both eyes 2 (two) times daily.      levothyroxine (SYNTHROID) 50 MCG tablet Take 1 tablet (50 mcg total) by mouth daily. 90 tablet 2   meclizine (ANTIVERT) 25 MG tablet Take 1 tablet (25 mg total) by mouth 3 (three) times daily as needed for dizziness. 30 tablet 0   metoprolol succinate (TOPROL-XL) 25 MG 24 hr tablet Take 1 tablet (25 mg total) by mouth 2 (two) times daily. 180 tablet 2   Multiple Vitamins-Minerals (OCUVITE ADULT FORMULA PO) Take 1 tablet by mouth daily.     mupirocin ointment (BACTROBAN) 2 % Apply 1 application topically 2 (two) times daily. 22 g 0   pantoprazole (PROTONIX) 40 MG tablet TAKE 1 TABLET BY MOUTH 1  TIMES DAILY 90 tablet 3   ROCKLATAN 0.02-0.005 % SOLN Apply 1 drop to eye at bedtime.     sertraline (ZOLOFT) 25  MG tablet Take 1 tablet (25 mg total) by mouth daily. 30 tablet 3   donepezil (ARICEPT) 10 MG tablet Take 1 tablet (10 mg total) by mouth at bedtime. (Patient not taking: Reported on 08/24/2020) 60 tablet 3   No facility-administered medications prior to visit.    Allergies  Allergen Reactions   Meperidine Nausea Only    ROS Review of Systems  Constitutional:  Negative for activity change and appetite change.  HENT:  Negative for congestion.   Respiratory:  Negative for chest tightness and shortness of breath.   Gastrointestinal:  Negative for abdominal distention and abdominal pain.  Genitourinary: Negative.   Musculoskeletal:  Positive for arthralgias (left hip) and back pain.  Skin: Negative.   Neurological: Negative.      Objective:    Physical Exam Vitals reviewed.   Constitutional:      Appearance: Normal appearance.  HENT:     Head: Normocephalic.     Right Ear: Tympanic membrane, ear canal and external ear normal.     Left Ear: Tympanic membrane, ear canal and external ear normal.  Cardiovascular:     Rate and Rhythm: Normal rate and regular rhythm.     Pulses: Normal pulses.     Heart sounds: Normal heart sounds. No murmur heard.   No gallop.  Pulmonary:     Effort: Pulmonary effort is normal. No respiratory distress.     Breath sounds: Normal breath sounds. No rales.  Abdominal:     General: Abdomen is flat. Bowel sounds are normal.     Palpations: Abdomen is soft.  Musculoskeletal:        General: Tenderness (left hip and gluteal area) present.  Skin:    Capillary Refill: Capillary refill takes less than 2 seconds.     Comments: 2 skin avulsions left UE, no infection, dressed  Neurological:     General: No focal deficit present.     Mental Status: She is alert and oriented to person, place, and time.    BP 118/70   Pulse 83   Temp (!) 97.4 F (36.3 C)   Resp 16   Ht _0  (1.499 m)   Wt 130 lb (59 kg)   SpO2 95%   BMI 26.26 kg/m  Wt Readings from Last 3 Encounters:  08/31/20 130 lb (59 kg)  06/04/20 136 lb (61.7 kg)  10/02/19 142 lb 3.2 oz (64.5 kg)     Health Maintenance Due  Topic Date Due   TETANUS/TDAP  Never done   Zoster Vaccines- Shingrix (2 of 2) 06/21/2018   COVID-19 Vaccine (3 - Booster for Pfizer series) 11/03/2019    There are no preventive care reminders to display for this patient.  Lab Results  Component Value Date   TSH 2.410 06/04/2020   Lab Results  Component Value Date   WBC 9.2 06/04/2020   HGB 14.3 06/04/2020   HCT 42.5 06/04/2020   MCV 91 06/04/2020   PLT 277 06/04/2020   Lab Results  Component Value Date   NA 140 06/04/2020   K 4.3 06/04/2020   CO2 21 06/04/2020   GLUCOSE 125 (H) 06/04/2020   BUN 15 06/04/2020   CREATININE 0.92 06/04/2020   BILITOT 0.7 06/04/2020   ALKPHOS  77 06/04/2020   AST 19 06/04/2020   ALT 11 06/04/2020   PROT 6.4 06/04/2020   ALBUMIN 3.9 06/04/2020   CALCIUM 9.6 06/04/2020   EGFR 60 06/04/2020   Lab Results  Component Value Date  CHOL 122 06/04/2020   Lab Results  Component Value Date   HDL 58 06/04/2020   Lab Results  Component Value Date   LDLCALC 47 06/04/2020   Lab Results  Component Value Date   TRIG 92 06/04/2020   Lab Results  Component Value Date   CHOLHDL 2.1 06/04/2020   No results found for: HGBA1C    Assessment & Plan:   Diagnoses and all orders for this visit: Iliopsoas bursitis of left hip  Left gluteal bursa injected, no complications.  After consent was obtained, using sterile technique the left gluteal bursa was prepped and plain ethyl chloride was used as local anesthetic. The bursa was entered and  Steroid 80 mg and 3 ml plain Lidocaine was then injected and the needle withdrawn.  The procedure was well tolerated.  The patient is asked to continue to rest the joint for a few more days before resuming regular activities.  It may be more painful for the first 1-2 days.  Watch for fever, or increased swelling or persistent pain in the joint. Call or return to clinic prn if such symptoms occur or there is failure to improve as anticipated.     Follow-up: Return in about 3 months (around 12/01/2020).    [name], MD

## 2020-10-27 DIAGNOSIS — M1711 Unilateral primary osteoarthritis, right knee: Secondary | ICD-10-CM | POA: Diagnosis not present

## 2020-10-27 DIAGNOSIS — M25461 Effusion, right knee: Secondary | ICD-10-CM | POA: Diagnosis not present

## 2020-10-27 DIAGNOSIS — M21061 Valgus deformity, not elsewhere classified, right knee: Secondary | ICD-10-CM | POA: Diagnosis not present

## 2020-10-28 NOTE — Telephone Encounter (Signed)
This encounter was created in error - please disregard.

## 2020-10-29 ENCOUNTER — Ambulatory Visit (INDEPENDENT_AMBULATORY_CARE_PROVIDER_SITE_OTHER): Payer: Medicare Other | Admitting: Legal Medicine

## 2020-10-29 ENCOUNTER — Encounter: Payer: Self-pay | Admitting: Legal Medicine

## 2020-10-29 ENCOUNTER — Other Ambulatory Visit: Payer: Self-pay

## 2020-10-29 VITALS — BP 118/60 | HR 73 | Temp 97.6°F | Resp 16 | Ht 59.0 in | Wt 131.0 lb

## 2020-10-29 DIAGNOSIS — Z01818 Encounter for other preprocedural examination: Secondary | ICD-10-CM

## 2020-10-29 NOTE — Progress Notes (Signed)
Established Patient Office Visit  Subjective:  Patient ID: Amber Brown, female    DOB: [birth year]  Age: 87 y.o. MRN: [id number]  CC:  Chief Complaint  Patient presents with   Knee Pain    Right knee pain since few months ago.   Annual Exam    HPI Amber Brown presents for preoperative Physical  She plans for right TKA by Dr. Ronnie Derby.    No CHF, MI, no renal failure, no recent surgery, No COPD, strokes  4 years ago with ocular deficit only or bleeding diathesis.  Oa right knee progressively worse and plans for TKA spinal anesthesia.  Patient presents for follow up of hypertension.  Patient tolerating metoprolol well with side effects.  Patient was diagnosed with hypertension 2010 so has been treated for hypertension for 10 years.Patient is working on maintaining diet and exercise regimen and follows up as directed. Complication include none.   Patient has HYPOTHYROIDISM.  Diagnosed 10 years ago.  Patient has stable thyroid readings.  Patient is having no symptoms.  Last TSH was normal.  continue dosage of thyroid medicine.   Patient presents with hyperlipidemia.  Compliance with treatment has been good; patient takes medicines as directed, maintains low cholesterol diet, follows up as directed, and maintains exercise regimen.  Patient is using atorvastatin without problems.   Past Medical History:  Diagnosis Date   Degenerative scoliosis in adult patient 01/06/2015   GERD with esophagitis 05/28/2019   Glaucoma 02/20/2017   Hypertension 02/20/2017   Hypothyroidism, adult 05/28/2019   Mixed hyperlipidemia 05/28/2019   Osteoporosis of forearm without pathological fracture 05/28/2019   Peripheral vascular disease ([place]) 05/28/2019   Sequelae of cerebral infarction 05/28/2019    Past Surgical History:  Procedure Laterality Date   CHOLECYSTECTOMY      Family History  Problem Relation Age of Onset   Aneurysm Mother    Bone cancer Father    Heart Problems Brother    Alcohol abuse Brother     Lung cancer Brother     Social History   Socioeconomic History   Marital status: Widowed    Spouse name: Not on file   Number of children: Not on file   Years of education: Not on file   Highest education level: Not on file  Occupational History   Not on file  Tobacco Use   Smoking status: Never   Smokeless tobacco: Never  Substance and Sexual Activity   Alcohol use: Never   Drug use: Never   Sexual activity: Not Currently  Other Topics Concern   Not on file  Social History Narrative   Not on file   Social Determinants of Health   Financial Resource Strain: Not on file  Food Insecurity: Not on file  Transportation Needs: Not on file  Physical Activity: Not on file  Stress: Not on file  Social Connections: Not on file  Intimate Partner Violence: Not on file    Outpatient Medications Prior to Visit  Medication Sig Dispense Refill   alendronate (FOSAMAX) 70 MG tablet TAKE 1 TABLET BY MOUTH ONCE A WEEK 12 tablet 3   aspirin 81 MG EC tablet Take 81 mg by mouth daily.      atorvastatin (LIPITOR) 40 MG tablet TAKE 1 TABLET BY MOUTH  DAILY 90 tablet 1   calcium carbonate (OSCAL) 1500 (600 Ca) MG TABS tablet Take 600 mg of elemental calcium by mouth daily with breakfast.     clopidogrel (PLAVIX) 75 MG tablet Take 1  tablet (75 mg total) by mouth daily. 90 tablet 2   dorzolamide-timolol (COSOPT) 22.3-6.8 MG/ML ophthalmic solution Place 1 drop into both eyes 2 (two) times daily.      levothyroxine (SYNTHROID) 50 MCG tablet Take 1 tablet (50 mcg total) by mouth daily. 90 tablet 2   meclizine (ANTIVERT) 25 MG tablet Take 1 tablet (25 mg total) by mouth 3 (three) times daily as needed for dizziness. 30 tablet 0   metoprolol succinate (TOPROL-XL) 25 MG 24 hr tablet Take 1 tablet (25 mg total) by mouth 2 (two) times daily. 180 tablet 2   Multiple Vitamins-Minerals (OCUVITE ADULT FORMULA PO) Take 1 tablet by mouth daily.     pantoprazole (PROTONIX) 40 MG tablet TAKE 1 TABLET BY MOUTH  1  TIMES DAILY 90 tablet 3   ROCKLATAN 0.02-0.005 % SOLN Apply 1 drop to eye at bedtime.     sertraline (ZOLOFT) 25 MG tablet Take 1 tablet (25 mg total) by mouth daily. 30 tablet 3   SSD 1 % cream Apply topically daily.     mupirocin ointment (BACTROBAN) 2 % Apply 1 application topically 2 (two) times daily. 22 g 0   No facility-administered medications prior to visit.    Allergies  Allergen Reactions   Meperidine Nausea Only    ROS Review of Systems  Constitutional:  Negative for activity change and appetite change.  HENT:  Negative for congestion.   Eyes:  Negative for visual disturbance.  Respiratory:  Negative for chest tightness and shortness of breath.   Cardiovascular:  Negative for chest pain and palpitations.  Gastrointestinal:  Negative for abdominal distention and abdominal pain.  Endocrine: Negative for polyuria.  Genitourinary:  Negative for difficulty urinating and dysuria.  Musculoskeletal:  Positive for arthralgias (right knee).  Neurological: Negative.   Psychiatric/Behavioral: Negative.       Objective:    Physical Exam Vitals reviewed.  Constitutional:      General: She is in acute distress.     Appearance: Normal appearance.  HENT:     Right Ear: Tympanic membrane normal.     Left Ear: Tympanic membrane normal.     Nose: Nose normal.     Mouth/Throat:     Mouth: Mucous membranes are moist.  Eyes:     Extraocular Movements: Extraocular movements intact.     Conjunctiva/sclera: Conjunctivae normal.     Pupils: Pupils are equal, round, and reactive to light.  Cardiovascular:     Rate and Rhythm: Normal rate and regular rhythm.     Pulses: Normal pulses.     Heart sounds: Normal heart sounds. No murmur heard.   No gallop.  Pulmonary:     Effort: Pulmonary effort is normal. No respiratory distress.     Breath sounds: Normal breath sounds. No wheezing.  Abdominal:     General: Abdomen is flat. Bowel sounds are normal. There is no distension.      Palpations: Abdomen is soft.     Tenderness: There is no abdominal tenderness.  Musculoskeletal:     Cervical back: Normal range of motion.     Right lower leg: No edema.     Left lower leg: No edema.     Comments: Right knee pain  Skin:    General: Skin is warm.     Capillary Refill: Capillary refill takes less than 2 seconds.  Neurological:     General: No focal deficit present.     Mental Status: She is alert and oriented to person,   place, and time. Mental status is at baseline.  Psychiatric:        Mood and Affect: Mood normal.        Thought Content: Thought content normal.    BP 118/60   Pulse 73   Temp 97.6 F (36.4 C)   Resp 16   Ht 4' 11" (1.499 m)   Wt 131 lb (59.4 kg)   LMP  (LMP Unknown)   SpO2 98%   BMI 26.46 kg/m  Wt Readings from Last 3 Encounters:  10/29/20 131 lb (59.4 kg)  08/31/20 130 lb (59 kg)  06/04/20 136 lb (61.7 kg)     Health Maintenance Due  Topic Date Due   TETANUS/TDAP  Never done   Zoster Vaccines- Shingrix (2 of 2) 06/21/2018   COVID-19 Vaccine (3 - Booster for Pfizer series) 11/03/2019   INFLUENZA VACCINE  10/19/2020    There are no preventive care reminders to display for this patient.  Lab Results  Component Value Date   TSH 2.410 06/04/2020   Lab Results  Component Value Date   WBC 9.2 06/04/2020   HGB 14.3 06/04/2020   HCT 42.5 06/04/2020   MCV 91 06/04/2020   PLT 277 06/04/2020   Lab Results  Component Value Date   NA 140 06/04/2020   K 4.3 06/04/2020   CO2 21 06/04/2020   GLUCOSE 125 (H) 06/04/2020   BUN 15 06/04/2020   CREATININE 0.92 06/04/2020   BILITOT 0.7 06/04/2020   ALKPHOS 77 06/04/2020   AST 19 06/04/2020   ALT 11 06/04/2020   PROT 6.4 06/04/2020   ALBUMIN 3.9 06/04/2020   CALCIUM 9.6 06/04/2020   EGFR 60 06/04/2020   Lab Results  Component Value Date   CHOL 122 06/04/2020   Lab Results  Component Value Date   HDL 58 06/04/2020   Lab Results  Component Value Date   LDLCALC 47 06/04/2020    Lab Results  Component Value Date   TRIG 92 06/04/2020   Lab Results  Component Value Date   CHOLHDL 2.1 06/04/2020   No results found for: HGBA1C    Assessment & Plan:   Diagnoses and all orders for this visit: Pre-operative clearance  Cleared for surgery , hold plavix for 3 days    Follow-up: Return if symptoms worsen or fail to improve.     , MD 

## 2020-11-21 ENCOUNTER — Telehealth: Payer: Self-pay

## 2020-11-21 NOTE — Telephone Encounter (Signed)
Called pt could not leave VM. VM was not set up.  KP

## 2020-11-30 NOTE — Progress Notes (Signed)
Sent message, via epic in basket, requesting orders in epic from surgeon.  

## 2020-12-03 ENCOUNTER — Telehealth: Payer: Self-pay

## 2020-12-03 DIAGNOSIS — M25569 Pain in unspecified knee: Secondary | ICD-10-CM | POA: Diagnosis not present

## 2020-12-03 NOTE — Chronic Care Management (AMB) (Signed)
Chronic Care Management Pharmacy Assistant   Name: Amber Brown  MRN: [id number] DOB: [birth year]   Reason for Encounter: General Adherence Call    Recent office visits:  10/29/20 [name] MD (Surgery Clearance). D/C Mupriocin 2% 1 application 2 times daily. Instructed to hold Plavix for 3 days. Follow up as needed. 08/31/20 [name] MD (Lliopsoas Bursitis). D/C Donepezil 10 mg daily at bedtime. Gave an steroid injection in office. Follow up in 3 months.  Recent consult visits:  None since 08/24/20  Hospital visits:  None since 08/24/20  Medications: Outpatient Encounter Medications as of 12/03/2020  Medication Sig Note   alendronate (FOSAMAX) 70 MG tablet TAKE 1 TABLET BY MOUTH ONCE A WEEK    aspirin 81 MG EC tablet Take 81 mg by mouth daily.     atorvastatin (LIPITOR) 40 MG tablet TAKE 1 TABLET BY MOUTH  DAILY    calcium carbonate (OSCAL) 1500 (600 Ca) MG TABS tablet Take 600 mg of elemental calcium by mouth daily with breakfast.    clopidogrel (PLAVIX) 75 MG tablet Take 1 tablet (75 mg total) by mouth daily.    dorzolamide-timolol (COSOPT) 22.3-6.8 MG/ML ophthalmic solution Place 1 drop into both eyes 2 (two) times daily.     levothyroxine (SYNTHROID) 50 MCG tablet Take 1 tablet (50 mcg total) by mouth daily.    meclizine (ANTIVERT) 25 MG tablet Take 1 tablet (25 mg total) by mouth 3 (three) times daily as needed for dizziness.    metoprolol succinate (TOPROL-XL) 25 MG 24 hr tablet Take 1 tablet (25 mg total) by mouth 2 (two) times daily.    Multiple Vitamins-Minerals (OCUVITE ADULT FORMULA PO) Take 1 tablet by mouth daily.    pantoprazole (PROTONIX) 40 MG tablet TAKE 1 TABLET BY MOUTH 1  TIMES DAILY    ROCKLATAN 0.02-0.005 % SOLN Apply 1 drop to eye at bedtime.    sertraline (ZOLOFT) 25 MG tablet Take 1 tablet (25 mg total) by mouth daily. 08/25/2020: Patient never picked up or began treatment. Patient denies need for medication.    SSD 1 % cream Apply topically daily.     No facility-administered encounter medications on file as of 12/03/2020.   [place] for general disease state and medication adherence call.   Patient is not > 5 days past due for refill on the following medications per chart history:  [facility] Medications: Medication Name/mg Last Fill Days Supply Atorvastatin 40 mg  06/18/20 90ds  (Pt states the pharmacy automatically sends her refills and stated she has plenty on hand)  What concerns do you have about your medications? Pt states she has no concerns about her medications at this moment  The patient denies side effects with her medications.   How often do you forget or accidentally miss a dose? Pt states she never misses any doses because her niece helps with her meds.  Do you use a pillbox? Pt is using a pillbox and her niece organizes these for her  Are you having any problems getting your medications from your pharmacy? Pt states she always gets her medications on time   Has the cost of your medications been a concern? Pt states her Rocklatan 0.02-0.005% eye drops cost her about $40 each time she fills it. She stated her eye doctor, Dr. Tobin Chad gives her plenty of samples to help keep this filled but still has to pay for her RX when they run out. She has an upcoming appt on 12/18/20. She  was offered help with patient assistance but she declined for now until she talks with Dr. Tobin Chad  Since last visit with CPP, no interventions have been made: Pt states no changes have been made  The patient has not had an ED visit since last contact.   The patient reports the following problems with their health. She is having to have total knee replacement on 12/21/20. She also stated she is having vision problems which are being addressed by Dr. Tobin Chad but it makes life harder for her to be independent.  she denies  concerns or questions for Sherre Poot, at this time.   Patient states BP readings are as follows: Pt stated she is not  checking her BP    Counseled patient on:   [place] job taking medications!   Importance of taking medication daily without missed doses  Benefits of adherence packaging or a pillbox   Access to CCM team for any cost, medication, or pharmacy concerns   Care Gaps: Last annual wellness visit? None noted  If applicable: N/A Last eye exam / retinopathy screening? Diabetic foot exam?   [place]

## 2020-12-14 ENCOUNTER — Other Ambulatory Visit: Payer: Self-pay | Admitting: Orthopedic Surgery

## 2020-12-14 NOTE — Progress Notes (Addendum)
Anesthesia Review:  PCP: DR lawrence perry- LOV 10/29/20 for clearance . Clearance in ov note.  Clearance on chart dated 10/29/20.  Cardiologist : none  Chest x-ray : EKG : 12/15/20  Echo : Stress test: Cardiac Cath :  Activity level: cannot do a flight of stairs without difficulty due to poor vision.   Sleep Study/ CPAP : Fasting Blood Sugar :      / Checks Blood Sugar -- times a day:   Blood Thinner/ Instructions /Last Dose: ASA / Instructions/ Last Dose :   81 mg aspirin  Plavix - last dose per pt and niece was 12/14/20.  Enrique Sack, [place], to bring papers day of surgery. Niece.  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  Covid test- 12/17/20.

## 2020-12-14 NOTE — Progress Notes (Addendum)
DUE TO COVID-19 ONLY ONE VISITOR IS ALLOWED TO COME WITH YOU AND STAY IN THE WAITING ROOM ONLY DURING PRE OP AND PROCEDURE DAY OF SURGERY. THE 1 VISITOR  MAY VISIT WITH YOU AFTER SURGERY IN YOUR PRIVATE ROOM DURING VISITING HOURS ONLY!  YOU NEED TO HAVE A COVID 19 TEST ON__  12/17/20 _____ @_______ , THIS TEST MUST BE DONE BEFORE SURGERY,  COVID TESTING SITE IS AT [facility]. PLEASE REMAIN IN YOUR CAR THIS IS A DRIVER UP TEST. AFTER YOUR COVID TEST PLEASE WEAR A MASK OUT IN PUBLIC AND SOCIAL DISTANCE AND [place] YOUR HANDS FREQUENTLY. PLEASE ASK ALL YOUR CLOSE CONTACTS TO WEAR A MASK OUT IN PUBLIC AND SOCIAL DISTANCE AND [place] HANDS FREQUENTLY ALSO.               Amber Brown  12/14/2020   Your procedure is scheduled on:  12/21/2020   Report to [facility] Main  Entrance   Report to admitting at     [phone]     Call this number if you have problems the morning of surgery [phone]    Remember: Do not eat food , candy gum or mints :After Midnight. You may have clear liquids from midnight until __ 0610am  Please complete ensure preop drink by 0610am morning of surgery.     CLEAR LIQUID DIET   Foods Allowed                                                                       Coffee and tea, regular and decaf                              Plain Jell-O any favor except red or purple                                            Fruit ices (not with fruit pulp)                                      Iced Popsicles                                     Carbonated beverages, regular and diet                                    Cranberry, grape and apple juices Sports drinks like Gatorade Lightly seasoned clear broth or consume(fat free) Sugar   _____________________________________________________________________    BRUSH YOUR TEETH MORNING OF SURGERY AND RINSE YOUR MOUTH OUT, NO CHEWING GUM CANDY OR MINTS.     Take these medicines the morning of surgery with A SIP  OF WATER: eye drops as usual, synthroid, torpol, protonix   DO NOT TAKE ANY DIABETIC MEDICATIONS DAY OF YOUR SURGERY  You may not have any metal on your body including hair pins and              piercings  Do not wear jewelry, make-up, lotions, powders or perfumes, deodorant             Do not wear nail polish on your fingernails.  Do not shave  48 hours prior to surgery.              Men may shave face and neck.   Do not bring valuables to the hospital. [facility] [place].  Contacts, dentures or bridgework may not be worn into surgery.  Leave suitcase in the car. After surgery it may be brought to your room.     Patients discharged the day of surgery will not be allowed to drive home. IF YOU ARE HAVING SURGERY AND GOING HOME THE SAME DAY, YOU MUST HAVE AN ADULT TO DRIVE YOU HOME AND BE WITH YOU FOR 24 HOURS. YOU MAY GO HOME BY TAXI OR UBER OR ORTHERWISE, BUT AN ADULT MUST ACCOMPANY YOU HOME AND STAY WITH YOU FOR 24 HOURS.  Name and phone number of your driver:  Special Instructions: N/A              Please read over the following fact sheets you were given: _____________________________________________________________________  [facility] - Preparing for Surgery Before surgery, you can play an important role.  Because skin is not sterile, your skin needs to be as free of germs as possible.  You can reduce the number of germs on your skin by washing with CHG (chlorahexidine gluconate) soap before surgery.  CHG is an antiseptic cleaner which kills germs and bonds with the skin to continue killing germs even after washing. Please DO NOT use if you have an allergy to CHG or antibacterial soaps.  If your skin becomes reddened/irritated stop using the CHG and inform your nurse when you arrive at Short Stay. Do not shave (including legs and underarms) for at least 48 hours prior to the first CHG shower.  You may shave your  face/neck. Please follow these instructions carefully:  1.  Shower with CHG Soap the night before surgery and the  morning of Surgery.  2.  If you choose to wash your hair, wash your hair first as usual with your  normal  shampoo.  3.  After you shampoo, rinse your hair and body thoroughly to remove the  shampoo.                           4.  Use CHG as you would any other liquid soap.  You can apply chg directly  to the skin and wash                       Gently with a scrungie or clean washcloth.  5.  Apply the CHG Soap to your body ONLY FROM THE NECK DOWN.   Do not use on face/ open                           Wound or open sores. Avoid contact with eyes, ears mouth and genitals (private parts).  Wash face,  Genitals (private parts) with your normal soap.             6.  Wash thoroughly, paying special attention to the area where your surgery  will be performed.  7.  Thoroughly rinse your body with warm water from the neck down.  8.  DO NOT shower/wash with your normal soap after using and rinsing off  the CHG Soap.                9.  Pat yourself dry with a clean towel.            10.  Wear clean pajamas.            11.  Place clean sheets on your bed the night of your first shower and do not  sleep with pets. Day of Surgery : Do not apply any lotions/deodorants the morning of surgery.  Please wear clean clothes to the hospital/surgery center.  FAILURE TO FOLLOW THESE INSTRUCTIONS MAY RESULT IN THE CANCELLATION OF YOUR SURGERY PATIENT SIGNATURE_________________________________  NURSE SIGNATURE__________________________________  ________________________________________________________________________

## 2020-12-15 ENCOUNTER — Encounter (HOSPITAL_COMMUNITY)
Admission: RE | Admit: 2020-12-15 | Discharge: 2020-12-15 | Disposition: A | Discharge status: Home / Self Care | Payer: Medicare Other | Source: Ambulatory Visit | Attending: Orthopedic Surgery | Admitting: Orthopedic Surgery

## 2020-12-15 ENCOUNTER — Other Ambulatory Visit: Payer: Self-pay

## 2020-12-15 ENCOUNTER — Encounter (HOSPITAL_COMMUNITY): Payer: Self-pay

## 2020-12-15 DIAGNOSIS — Z01818 Encounter for other preprocedural examination: Secondary | ICD-10-CM | POA: Insufficient documentation

## 2020-12-15 HISTORY — DX: Personal history of urinary calculi: Z87.442

## 2020-12-15 HISTORY — DX: Unspecified osteoarthritis, unspecified site: M19.90

## 2020-12-15 HISTORY — DX: Cerebral infarction, unspecified: I63.9

## 2020-12-15 LAB — COMPREHENSIVE METABOLIC PANEL
ALT: 14 U/L (ref 0–44)
AST: 20 U/L (ref 15–41)
Albumin: 3.8 g/dL (ref 3.5–5.0)
Alkaline Phosphatase: 59 U/L (ref 38–126)
Anion gap: 8 (ref 5–15)
BUN: 21 mg/dL (ref 8–23)
CO2: 26 mmol/L (ref 22–32)
Calcium: 9.8 mg/dL (ref 8.9–10.3)
Chloride: 106 mmol/L (ref 98–111)
Creatinine, Ser: 0.87 mg/dL (ref 0.44–1.00)
GFR, Estimated: >60 mL/min (ref >60)
Glucose, Bld: 142 mg/dL — ABNORMAL HIGH (ref 70–99)
Potassium: 4.1 mmol/L (ref 3.5–5.1)
Sodium: 140 mmol/L (ref 135–145)
Total Bilirubin: 0.7 mg/dL (ref 0.3–1.2)
Total Protein: 6.5 g/dL (ref 6.5–8.1)

## 2020-12-15 LAB — CBC WITH DIFFERENTIAL/PLATELET
Abs Immature Granulocytes: 0.04 10*3/uL (ref 0.00–0.07)
Basophils Absolute: 0.1 10*3/uL (ref 0.0–0.1)
Basophils Relative: 1 %
Eosinophils Absolute: 0.1 10*3/uL (ref 0.0–0.5)
Eosinophils Relative: 1 %
HCT: 43.6 % (ref 36.0–46.0)
Hemoglobin: 13.7 g/dL (ref 12.0–15.0)
Immature Granulocytes: 0 %
Lymphocytes Relative: 23 %
Lymphs Abs: 2.5 10*3/uL (ref 0.7–4.0)
MCH: 31 pg (ref 26.0–34.0)
MCHC: 31.4 g/dL (ref 30.0–36.0)
MCV: 98.6 fL (ref 80.0–100.0)
Monocytes Absolute: 0.8 10*3/uL (ref 0.1–1.0)
Monocytes Relative: 8 %
Neutro Abs: 7.4 10*3/uL (ref 1.7–7.7)
Neutrophils Relative %: 67 %
Platelets: 317 10*3/uL (ref 150–400)
RBC: 4.42 MIL/uL (ref 3.87–5.11)
RDW: 12.7 % (ref 11.5–15.5)
WBC: 10.9 10*3/uL — ABNORMAL HIGH (ref 4.0–10.5)
nRBC: 0 % (ref 0.0–0.2)

## 2020-12-15 LAB — SURGICAL PCR SCREEN
MRSA, PCR: NEGATIVE
Staphylococcus aureus: NEGATIVE

## 2020-12-17 ENCOUNTER — Other Ambulatory Visit: Payer: Self-pay | Admitting: Orthopedic Surgery

## 2020-12-17 LAB — SARS CORONAVIRUS 2 (TAT 6-24 HRS): SARS Coronavirus 2: NEGATIVE

## 2020-12-18 DIAGNOSIS — H401133 Primary open-angle glaucoma, bilateral, severe stage: Secondary | ICD-10-CM | POA: Diagnosis not present

## 2020-12-21 ENCOUNTER — Ambulatory Visit (HOSPITAL_COMMUNITY): Payer: Medicare Other | Admitting: Physician Assistant

## 2020-12-21 ENCOUNTER — Other Ambulatory Visit: Payer: Self-pay

## 2020-12-21 ENCOUNTER — Encounter (HOSPITAL_COMMUNITY)
Admission: RE | Disposition: A | Discharge status: Home / Self Care | Payer: Self-pay | Source: Home / Self Care | Attending: Orthopedic Surgery

## 2020-12-21 ENCOUNTER — Encounter (HOSPITAL_COMMUNITY): Payer: Self-pay | Admitting: Orthopedic Surgery

## 2020-12-21 ENCOUNTER — Ambulatory Visit (HOSPITAL_COMMUNITY): Payer: Medicare Other | Admitting: Anesthesiology

## 2020-12-21 ENCOUNTER — Observation Stay (HOSPITAL_COMMUNITY)
Admission: RE | Admit: 2020-12-21 | Discharge: 2020-12-22 | Disposition: A | Discharge status: Home / Self Care | Payer: Medicare Other | Attending: Orthopedic Surgery | Admitting: Orthopedic Surgery

## 2020-12-21 DIAGNOSIS — Z79899 Other long term (current) drug therapy: Secondary | ICD-10-CM | POA: Insufficient documentation

## 2020-12-21 DIAGNOSIS — G8918 Other acute postprocedural pain: Secondary | ICD-10-CM | POA: Diagnosis not present

## 2020-12-21 DIAGNOSIS — I10 Essential (primary) hypertension: Secondary | ICD-10-CM | POA: Insufficient documentation

## 2020-12-21 DIAGNOSIS — M48062 Spinal stenosis, lumbar region with neurogenic claudication: Secondary | ICD-10-CM | POA: Diagnosis not present

## 2020-12-21 DIAGNOSIS — M1711 Unilateral primary osteoarthritis, right knee: Principal | ICD-10-CM | POA: Insufficient documentation

## 2020-12-21 DIAGNOSIS — E782 Mixed hyperlipidemia: Secondary | ICD-10-CM | POA: Diagnosis not present

## 2020-12-21 DIAGNOSIS — Z96659 Presence of unspecified artificial knee joint: Secondary | ICD-10-CM

## 2020-12-21 HISTORY — PX: TOTAL KNEE ARTHROPLASTY: SHX125

## 2020-12-21 SURGERY — ARTHROPLASTY, KNEE, TOTAL
Anesthesia: Monitor Anesthesia Care | Site: Knee | Laterality: Right

## 2020-12-21 MED ORDER — BUPIVACAINE LIPOSOME 1.3 % IJ SUSP
INTRAMUSCULAR | Status: AC
  Filled 2020-12-21: qty 20

## 2020-12-21 MED ORDER — LACTATED RINGERS IV SOLN: INTRAVENOUS | Status: DC

## 2020-12-21 MED ORDER — BUPIVACAINE LIPOSOME 1.3 % IJ SUSP: 20.0000 mL | Freq: Once | INTRAMUSCULAR | Status: AC

## 2020-12-21 MED ORDER — SODIUM CHLORIDE 0.9 % IV SOLN: INTRAVENOUS | Status: DC

## 2020-12-21 MED ORDER — BUPIVACAINE-EPINEPHRINE (PF) 0.25% -1:200000 IJ SOLN
INTRAMUSCULAR | Status: AC
  Filled 2020-12-21: qty 30

## 2020-12-21 MED ORDER — ORAL CARE MOUTH RINSE: 15.0000 mL | Freq: Once | OROMUCOSAL | Status: AC

## 2020-12-21 MED ORDER — PANTOPRAZOLE SODIUM 40 MG PO TBEC
40.0000 mg | DELAYED_RELEASE_TABLET | Freq: Every day | ORAL | Status: DC
  Administered 2020-12-22: 40 mg via ORAL
  Filled 2020-12-21: qty 1

## 2020-12-21 MED ORDER — BISACODYL 5 MG PO TBEC: 5.0000 mg | DELAYED_RELEASE_TABLET | Freq: Every day | ORAL | Status: DC | PRN

## 2020-12-21 MED ORDER — GABAPENTIN 300 MG PO CAPS
300.0000 mg | ORAL_CAPSULE | Freq: Once | ORAL | Status: AC
  Administered 2020-12-21: 300 mg via ORAL
  Filled 2020-12-21: qty 1

## 2020-12-21 MED ORDER — METOCLOPRAMIDE HCL 5 MG/ML IJ SOLN: 5.0000 mg | Freq: Three times a day (TID) | INTRAMUSCULAR | Status: DC | PRN

## 2020-12-21 MED ORDER — POVIDONE-IODINE 10 % EX SWAB
2.0000 "application " | Freq: Once | CUTANEOUS | Status: AC
  Administered 2020-12-21: 2 via TOPICAL

## 2020-12-21 MED ORDER — PHENYLEPHRINE 40 MCG/ML (10ML) SYRINGE FOR IV PUSH (FOR BLOOD PRESSURE SUPPORT)
PREFILLED_SYRINGE | INTRAVENOUS | Status: DC | PRN
  Administered 2020-12-21: 80 ug via INTRAVENOUS

## 2020-12-21 MED ORDER — SODIUM CHLORIDE 0.9 % IR SOLN
Status: DC | PRN
  Administered 2020-12-21 (×2): 1000 mL

## 2020-12-21 MED ORDER — PROPOFOL 500 MG/50ML IV EMUL
INTRAVENOUS | Status: DC | PRN
  Administered 2020-12-21: 50 ug/kg/min via INTRAVENOUS

## 2020-12-21 MED ORDER — WATER FOR IRRIGATION, STERILE IR SOLN
Status: DC | PRN
  Administered 2020-12-21: 2000 mL

## 2020-12-21 MED ORDER — FENTANYL CITRATE PF 50 MCG/ML IJ SOSY
50.0000 ug | PREFILLED_SYRINGE | INTRAMUSCULAR | Status: DC
  Administered 2020-12-21: 50 ug via INTRAVENOUS
  Filled 2020-12-21: qty 2

## 2020-12-21 MED ORDER — OXYCODONE HCL 5 MG PO TABS: 5.0000 mg | ORAL_TABLET | ORAL | Status: DC | PRN

## 2020-12-21 MED ORDER — DEXAMETHASONE SODIUM PHOSPHATE 10 MG/ML IJ SOLN
8.0000 mg | Freq: Once | INTRAMUSCULAR | Status: AC
  Administered 2020-12-21: 8 mg via INTRAVENOUS

## 2020-12-21 MED ORDER — OXYCODONE HCL 5 MG PO TABS: 5.0000 mg | ORAL_TABLET | Freq: Once | ORAL | Status: DC | PRN

## 2020-12-21 MED ORDER — CLOPIDOGREL BISULFATE 75 MG PO TABS
75.0000 mg | ORAL_TABLET | Freq: Every day | ORAL | Status: DC
  Administered 2020-12-22: 75 mg via ORAL
  Filled 2020-12-21: qty 1

## 2020-12-21 MED ORDER — DOCUSATE SODIUM 100 MG PO CAPS
100.0000 mg | ORAL_CAPSULE | Freq: Two times a day (BID) | ORAL | Status: DC
  Administered 2020-12-21 – 2020-12-22 (×2): 100 mg via ORAL
  Filled 2020-12-21 (×2): qty 1

## 2020-12-21 MED ORDER — ZOLPIDEM TARTRATE 5 MG PO TABS
5.0000 mg | ORAL_TABLET | Freq: Every evening | ORAL | Status: DC | PRN
  Administered 2020-12-21: 5 mg via ORAL
  Filled 2020-12-21: qty 1

## 2020-12-21 MED ORDER — HYDROMORPHONE HCL 1 MG/ML IJ SOLN: 0.5000 mg | INTRAMUSCULAR | Status: DC | PRN

## 2020-12-21 MED ORDER — DIPHENHYDRAMINE HCL 12.5 MG/5ML PO ELIX: 12.5000 mg | ORAL_SOLUTION | ORAL | Status: DC | PRN

## 2020-12-21 MED ORDER — TRANEXAMIC ACID-NACL 1000-0.7 MG/100ML-% IV SOLN
1000.0000 mg | INTRAVENOUS | Status: AC
  Administered 2020-12-21: 1000 mg via INTRAVENOUS
  Filled 2020-12-21: qty 100

## 2020-12-21 MED ORDER — METHOCARBAMOL 1000 MG/10ML IJ SOLN
500.0000 mg | Freq: Four times a day (QID) | INTRAVENOUS | Status: DC | PRN
  Filled 2020-12-21: qty 5

## 2020-12-21 MED ORDER — ALUM & MAG HYDROXIDE-SIMETH 200-200-20 MG/5ML PO SUSP: 30.0000 mL | ORAL | Status: DC | PRN

## 2020-12-21 MED ORDER — SENNOSIDES-DOCUSATE SODIUM 8.6-50 MG PO TABS: 1.0000 | ORAL_TABLET | Freq: Every evening | ORAL | Status: DC | PRN

## 2020-12-21 MED ORDER — LEVOTHYROXINE SODIUM 50 MCG PO TABS
50.0000 ug | ORAL_TABLET | Freq: Every day | ORAL | Status: DC
  Administered 2020-12-22: 50 ug via ORAL
  Filled 2020-12-21: qty 1

## 2020-12-21 MED ORDER — FLEET ENEMA 7-19 GM/118ML RE ENEM: 1.0000 | ENEMA | Freq: Once | RECTAL | Status: DC | PRN

## 2020-12-21 MED ORDER — METHOCARBAMOL 500 MG PO TABS
500.0000 mg | ORAL_TABLET | Freq: Four times a day (QID) | ORAL | Status: DC | PRN
  Administered 2020-12-21: 500 mg via ORAL
  Filled 2020-12-21: qty 1

## 2020-12-21 MED ORDER — ACETAMINOPHEN 500 MG PO TABS
1000.0000 mg | ORAL_TABLET | Freq: Once | ORAL | Status: AC
  Administered 2020-12-21: 1000 mg via ORAL
  Filled 2020-12-21: qty 2

## 2020-12-21 MED ORDER — PANTOPRAZOLE SODIUM 40 MG PO TBEC: 40.0000 mg | DELAYED_RELEASE_TABLET | Freq: Every day | ORAL | Status: DC

## 2020-12-21 MED ORDER — DEXAMETHASONE SODIUM PHOSPHATE 10 MG/ML IJ SOLN
INTRAMUSCULAR | Status: AC
  Filled 2020-12-21: qty 1

## 2020-12-21 MED ORDER — AMISULPRIDE (ANTIEMETIC) 5 MG/2ML IV SOLN: 10.0000 mg | Freq: Once | INTRAVENOUS | Status: DC | PRN

## 2020-12-21 MED ORDER — ASPIRIN 81 MG PO CHEW
81.0000 mg | CHEWABLE_TABLET | Freq: Two times a day (BID) | ORAL | Status: DC
  Administered 2020-12-21 – 2020-12-22 (×2): 81 mg via ORAL
  Filled 2020-12-21 (×2): qty 1

## 2020-12-21 MED ORDER — BUPIVACAINE IN DEXTROSE 0.75-8.25 % IT SOLN
INTRATHECAL | Status: DC | PRN
  Administered 2020-12-21: 1.6 mL via INTRATHECAL

## 2020-12-21 MED ORDER — MECLIZINE HCL 25 MG PO TABS: 25.0000 mg | ORAL_TABLET | Freq: Three times a day (TID) | ORAL | Status: DC | PRN

## 2020-12-21 MED ORDER — ONDANSETRON HCL 4 MG PO TABS: 4.0000 mg | ORAL_TABLET | Freq: Four times a day (QID) | ORAL | Status: DC | PRN

## 2020-12-21 MED ORDER — FERROUS SULFATE 325 (65 FE) MG PO TABS
325.0000 mg | ORAL_TABLET | Freq: Three times a day (TID) | ORAL | Status: DC
  Administered 2020-12-22 (×2): 325 mg via ORAL
  Filled 2020-12-21 (×2): qty 1

## 2020-12-21 MED ORDER — BUPIVACAINE HCL (PF) 0.5 % IJ SOLN
INTRAMUSCULAR | Status: DC | PRN
  Administered 2020-12-21: 30 mL

## 2020-12-21 MED ORDER — BUPIVACAINE LIPOSOME 1.3 % IJ SUSP
INTRAMUSCULAR | Status: DC | PRN
  Administered 2020-12-21: 20 mL

## 2020-12-21 MED ORDER — CHLORHEXIDINE GLUCONATE 0.12 % MT SOLN
15.0000 mL | Freq: Once | OROMUCOSAL | Status: AC
  Administered 2020-12-21: 15 mL via OROMUCOSAL

## 2020-12-21 MED ORDER — ACETAMINOPHEN 500 MG PO TABS
1000.0000 mg | ORAL_TABLET | Freq: Four times a day (QID) | ORAL | Status: AC
  Administered 2020-12-21 – 2020-12-22 (×3): 1000 mg via ORAL
  Filled 2020-12-21 (×3): qty 2

## 2020-12-21 MED ORDER — MENTHOL 3 MG MT LOZG: 1.0000 | LOZENGE | OROMUCOSAL | Status: DC | PRN

## 2020-12-21 MED ORDER — CEFAZOLIN SODIUM-DEXTROSE 2-4 GM/100ML-% IV SOLN
2.0000 g | INTRAVENOUS | Status: AC
  Administered 2020-12-21: 2 g via INTRAVENOUS
  Filled 2020-12-21: qty 100

## 2020-12-21 MED ORDER — ONDANSETRON HCL 4 MG/2ML IJ SOLN
INTRAMUSCULAR | Status: AC
  Filled 2020-12-21: qty 2

## 2020-12-21 MED ORDER — MIDAZOLAM HCL 2 MG/2ML IJ SOLN
1.0000 mg | INTRAMUSCULAR | Status: DC
Start: 2020-12-21 — End: 2020-12-21
  Filled 2020-12-21: qty 2

## 2020-12-21 MED ORDER — PHENOL 1.4 % MT LIQD: 1.0000 | OROMUCOSAL | Status: DC | PRN

## 2020-12-21 MED ORDER — CHLORHEXIDINE GLUCONATE CLOTH 2 % EX PADS: 6.0000 | MEDICATED_PAD | Freq: Every day | CUTANEOUS | Status: DC

## 2020-12-21 MED ORDER — PHENYLEPHRINE 40 MCG/ML (10ML) SYRINGE FOR IV PUSH (FOR BLOOD PRESSURE SUPPORT)
PREFILLED_SYRINGE | INTRAVENOUS | Status: AC
  Filled 2020-12-21: qty 10

## 2020-12-21 MED ORDER — ONDANSETRON HCL 4 MG/2ML IJ SOLN: 4.0000 mg | Freq: Once | INTRAMUSCULAR | Status: DC | PRN

## 2020-12-21 MED ORDER — BUPIVACAINE-EPINEPHRINE 0.25% -1:200000 IJ SOLN
INTRAMUSCULAR | Status: DC | PRN
  Administered 2020-12-21: 30 mL

## 2020-12-21 MED ORDER — FENTANYL CITRATE PF 50 MCG/ML IJ SOSY: 25.0000 ug | PREFILLED_SYRINGE | INTRAMUSCULAR | Status: DC | PRN

## 2020-12-21 MED ORDER — TRAMADOL HCL 50 MG PO TABS
50.0000 mg | ORAL_TABLET | Freq: Four times a day (QID) | ORAL | Status: DC
  Administered 2020-12-21 – 2020-12-22 (×4): 50 mg via ORAL
  Filled 2020-12-21 (×4): qty 1

## 2020-12-21 MED ORDER — DORZOLAMIDE HCL-TIMOLOL MAL 2-0.5 % OP SOLN
1.0000 [drp] | Freq: Two times a day (BID) | OPHTHALMIC | Status: DC
  Administered 2020-12-21 – 2020-12-22 (×2): 1 [drp] via OPHTHALMIC
  Filled 2020-12-21 (×2): qty 10

## 2020-12-21 MED ORDER — METOCLOPRAMIDE HCL 5 MG PO TABS: 5.0000 mg | ORAL_TABLET | Freq: Three times a day (TID) | ORAL | Status: DC | PRN

## 2020-12-21 MED ORDER — NETARSUDIL-LATANOPROST 0.02-0.005 % OP SOLN: 1.0000 [drp] | Freq: Every day | OPHTHALMIC | Status: DC

## 2020-12-21 MED ORDER — SODIUM CHLORIDE (PF) 0.9 % IJ SOLN
INTRAMUSCULAR | Status: AC
  Filled 2020-12-21: qty 20

## 2020-12-21 MED ORDER — OXYCODONE HCL 5 MG/5ML PO SOLN
5.0000 mg | Freq: Once | ORAL | Status: DC | PRN
Start: 2020-12-21 — End: 2020-12-21

## 2020-12-21 MED ORDER — ONDANSETRON HCL 4 MG/2ML IJ SOLN: 4.0000 mg | Freq: Four times a day (QID) | INTRAMUSCULAR | Status: DC | PRN

## 2020-12-21 MED ORDER — ATORVASTATIN CALCIUM 40 MG PO TABS
40.0000 mg | ORAL_TABLET | Freq: Every day | ORAL | Status: DC
  Administered 2020-12-21 – 2020-12-22 (×2): 40 mg via ORAL
  Filled 2020-12-21 (×2): qty 1

## 2020-12-21 MED ORDER — METOPROLOL SUCCINATE ER 25 MG PO TB24
25.0000 mg | ORAL_TABLET | Freq: Two times a day (BID) | ORAL | Status: DC
  Administered 2020-12-21 – 2020-12-22 (×2): 25 mg via ORAL
  Filled 2020-12-21 (×2): qty 1

## 2020-12-21 MED ORDER — DEXAMETHASONE SODIUM PHOSPHATE 10 MG/ML IJ SOLN
10.0000 mg | Freq: Once | INTRAMUSCULAR | Status: AC
  Administered 2020-12-22: 10 mg via INTRAVENOUS
  Filled 2020-12-21: qty 1

## 2020-12-21 MED ORDER — SODIUM CHLORIDE 0.9% FLUSH
INTRAVENOUS | Status: DC | PRN
  Administered 2020-12-21: 20 mL

## 2020-12-21 MED ORDER — ONDANSETRON HCL 4 MG/2ML IJ SOLN
INTRAMUSCULAR | Status: DC | PRN
  Administered 2020-12-21: 4 mg via INTRAVENOUS

## 2020-12-21 SURGICAL SUPPLY — 60 items
BAG COUNTER SPONGE SURGICOUNT (BAG) ×1 IMPLANT
BAG SPEC THK2 15X12 ZIP CLS (MISCELLANEOUS) ×1
BAG SPNG CNTER NS LX DISP (BAG) ×1
BAG ZIPLOCK 12X15 (MISCELLANEOUS) ×2 IMPLANT
BLADE SAGITTAL 13X1.27X60 (BLADE) ×2 IMPLANT
BLADE SAW SGTL 18X1.27X75 (BLADE) ×2 IMPLANT
BLADE SURG 15 STRL LF DISP TIS (BLADE) ×1 IMPLANT
BLADE SURG 15 STRL SS (BLADE) ×2
BLADE SURG SZ10 CARB STEEL (BLADE) ×4 IMPLANT
BNDG ELASTIC 6X5.8 VLCR STR LF (GAUZE/BANDAGES/DRESSINGS) ×2 IMPLANT
BOWL SMART MIX CTS (DISPOSABLE) ×2 IMPLANT
BSPLAT TIB 5D E CMNT KN RT (Knees) ×1 IMPLANT
CEMENT BONE SIMPLEX SPEEDSET (Cement) ×4 IMPLANT
COVER SURGICAL LIGHT HANDLE (MISCELLANEOUS) ×2 IMPLANT
CUFF TOURN SGL QUICK 34 (TOURNIQUET CUFF) ×2
CUFF TRNQT CYL 34X4.125X (TOURNIQUET CUFF) ×1 IMPLANT
DECANTER SPIKE VIAL GLASS SM (MISCELLANEOUS) ×2 IMPLANT
DRAPE INCISE IOBAN 66X45 STRL (DRAPES) ×10 IMPLANT
DRAPE U-SHAPE 47X51 STRL (DRAPES) ×2 IMPLANT
DRSG AQUACEL AG ADV 3.5X10 (GAUZE/BANDAGES/DRESSINGS) ×2 IMPLANT
DURAPREP 26ML APPLICATOR (WOUND CARE) ×4 IMPLANT
ELECT REM PT RETURN 15FT ADLT (MISCELLANEOUS) ×2 IMPLANT
FEMUR  CMT CCR STD SZ5 R KNEE (Knees) ×2 IMPLANT
FEMUR CMT CCR STD SZ5 R KNEE (Knees) ×1 IMPLANT
FEMUR CMTD CCR STD SZ5 R KNEE (Knees) IMPLANT
GLOVE SURG ENC TEXT LTX SZ7.5 (GLOVE) ×2 IMPLANT
GLOVE SURG ORTHO LTX SZ8 (GLOVE) ×6 IMPLANT
GLOVE SURG UNDER POLY LF SZ7.5 (GLOVE) ×2 IMPLANT
GLOVE SURG UNDER POLY LF SZ8.5 (GLOVE) ×4 IMPLANT
GOWN STRL REUS W/ TWL XL LVL3 (GOWN DISPOSABLE) ×2 IMPLANT
GOWN STRL REUS W/TWL XL LVL3 (GOWN DISPOSABLE) ×4
HANDPIECE INTERPULSE COAX TIP (DISPOSABLE) ×2
HDLS TROCR DRIL PIN KNEE 75 (PIN) ×2
HOLDER FOLEY CATH W/STRAP (MISCELLANEOUS) ×2 IMPLANT
HOOD PEEL AWAY FLYTE STAYCOOL (MISCELLANEOUS) ×6 IMPLANT
INSERT TIB ASF PS EF3-5 10 (Insert) ×1 IMPLANT
KIT TURNOVER KIT A (KITS) ×2 IMPLANT
MANIFOLD NEPTUNE II (INSTRUMENTS) ×2 IMPLANT
NDL HYPO 21X1.5 SAFETY (NEEDLE) ×1 IMPLANT
NEEDLE HYPO 21X1.5 SAFETY (NEEDLE) ×2 IMPLANT
NS IRRIG 1000ML POUR BTL (IV SOLUTION) ×2 IMPLANT
PACK TOTAL KNEE CUSTOM (KITS) ×2 IMPLANT
PIN DRILL HDLS TROCAR 75 4PK (PIN) IMPLANT
PROTECTOR NERVE ULNAR (MISCELLANEOUS) ×2 IMPLANT
SET HNDPC FAN SPRY TIP SCT (DISPOSABLE) ×1 IMPLANT
STEM POLY PAT PLY 32M KNEE (Knees) ×1 IMPLANT
STEM TIBIA 5 DEG SZ E R KNEE (Knees) IMPLANT
STRIP CLOSURE SKIN 1/2X4 (GAUZE/BANDAGES/DRESSINGS) ×3 IMPLANT
SUT BONE WAX W31G (SUTURE) ×2 IMPLANT
SUT MNCRL AB 3-0 PS2 18 (SUTURE) ×2 IMPLANT
SUT STRATAFIX 0 PDS 27 VIOLET (SUTURE) ×2
SUT STRATAFIX PDS+ 0 24IN (SUTURE) ×2 IMPLANT
SUT VIC AB 1 CT1 36 (SUTURE) ×2 IMPLANT
SUTURE STRATFX 0 PDS 27 VIOLET (SUTURE) ×1 IMPLANT
SYR 30ML LL (SYRINGE) ×4 IMPLANT
TIBIA STEM 5 DEG SZ E R KNEE (Knees) ×2 IMPLANT
TRAY FOLEY MTR SLVR 14FR STAT (SET/KITS/TRAYS/PACK) ×1 IMPLANT
TRAY FOLEY MTR SLVR 16FR STAT (SET/KITS/TRAYS/PACK) ×1 IMPLANT
WATER STERILE IRR 1000ML POUR (IV SOLUTION) ×4 IMPLANT
WRAP KNEE MAXI GEL POST OP (GAUZE/BANDAGES/DRESSINGS) ×2 IMPLANT

## 2020-12-21 NOTE — Evaluation (Signed)
Physical Therapy Evaluation Patient Details Name: Amber Brown MRN: [id number] DOB: [birth year] Today's Date: 12/21/2020  History of Present Illness  Amber Brown is 87 YO female, S?P RTKA on 12/21/20, PMH: glaucoma, stroke, PVD,scoliosis  Clinical Impression  The patient reports that the right leg is tingling/pins and needles. Patient mobilized to sitting without support. Stood x 2 carefully, abl e to bear weight on the leg but still numbish. Did not pursue ambulation  at this time. Asissted back into bed . Pt admitted with above diagnosis.   Pt currently with functional limitations due to the deficits listed below (see PT Problem List). Pt will benefit from skilled PT to increase their independence and safety with mobility to allow discharge to the venue listed below.        Recommendations for follow up therapy are one component of a multi-disciplinary discharge planning process, led by the attending physician.  Recommendations may be updated based on patient status, additional functional criteria and insurance authorization.  Follow Up Recommendations Follow surgeon's recommendation for DC plan and follow-up therapies    Equipment Recommendations  None recommended by PT    Recommendations for Other Services       Precautions / Restrictions Precautions Precautions: Fall;Knee Precaution Comments: low vision due to glaucoma, niece lives with patient and states that patient will need assistance to ambulate safely using RW Restrictions Weight Bearing Restrictions: No      Mobility  Bed Mobility Overal bed mobility: Needs Assistance Bed Mobility: Supine to Sit;Sit to Supine     Supine to sit: Min guard Sit to supine: Min guard   General bed mobility comments: patient managed  right leg, sat self up and placed legs back onto bed    Transfers Overall transfer level: Needs assistance Equipment used: Rolling walker (2 wheeled) Transfers: Sit to/from Stand Sit to Stand: Mod  assist;+2 safety/equipment;+2 physical assistance         General transfer comment: Reports right leg still numb and tingling. able to stand x 2 from bed, Able to bear some weight on right leg, took 3 side steps  Ambulation/Gait             General Gait Details: TBA right leg numbish  Stairs            Wheelchair Mobility    Modified Rankin (Stroke Patients Only)       Balance Overall balance assessment: Needs assistance Sitting-balance support: Bilateral upper extremity supported;Feet supported Sitting balance-Leahy Scale: Good     Standing balance support: During functional activity;Bilateral upper extremity supported Standing balance-Leahy Scale: Poor Standing balance comment: reliant on UE support,                             Pertinent Vitals/Pain Pain Assessment: 0-10 Pain Score: 5  Pain Location: right knee Pain Descriptors / Indicators: Tingling;Pins and needles Pain Intervention(s): Monitored during session;Ice applied;Premedicated before session;Repositioned    Home Living Family/patient expects to be discharged to:: Private residence Living Arrangements: Other relatives Available Help at Discharge: Family;Available 24 hours/day Type of Home: House Home Access: Level entry     Home Layout: One level Home Equipment: Bedside commode      Prior Function Level of Independence: Independent               Hand Dominance   Dominant Hand: Right    Extremity/Trunk Assessment   Upper Extremity Assessment Upper Extremity Assessment: Overall WFL for tasks  assessed    Lower Extremity Assessment Lower Extremity Assessment: RLE deficits/detail RLE Deficits / Details: 3+ knee ext, nee flexed to70 *, still pins and needles RLE Sensation: decreased light touch;decreased proprioception       Communication   Communication: No difficulties  Cognition Arousal/Alertness: Awake/alert Behavior During Therapy: WFL for tasks  assessed/performed Overall Cognitive Status: Within Functional Limits for tasks assessed                                        General Comments      Exercises     Assessment/Plan    PT Assessment Patient needs continued PT services  PT Problem List Decreased strength;Decreased mobility;Decreased safety awareness;Decreased range of motion;Decreased coordination;Decreased activity tolerance;Decreased cognition;Impaired sensation;Decreased balance       PT Treatment Interventions DME instruction;Therapeutic activities;Gait training;Therapeutic exercise;Patient/family education;Stair training;Balance training;Functional mobility training    PT Goals (Current goals can be found in the Care Plan section)  Acute Rehab PT Goals Patient Stated Goal: , no pain PT Goal Formulation: With patient/family Time For Goal Achievement: 12/28/20 Potential to Achieve Goals: Good    Frequency 7X/week   Barriers to discharge        Co-evaluation               AM-PAC PT "6 Clicks" Mobility  Outcome Measure Help needed turning from your back to your side while in a flat bed without using bedrails?: A Little Help needed moving from lying on your back to sitting on the side of a flat bed without using bedrails?: A Little Help needed moving to and from a bed to a chair (including a wheelchair)?: A Lot Help needed standing up from a chair using your arms (e.g., wheelchair or bedside chair)?: A Lot Help needed to walk in hospital room?: A Lot Help needed climbing 3-5 steps with a railing? : Total 6 Click Score: 13    End of Session Equipment Utilized During Treatment: Gait belt Activity Tolerance: Patient tolerated treatment well Patient left: in bed;with call bell/phone within reach;with bed alarm set;with family/visitor present Nurse Communication: Mobility status PT Visit Diagnosis: Unsteadiness on feet (R26.81);Other symptoms and signs involving the nervous system  (R29.898)    Time: 7096-4383 PT Time Calculation (min) (ACUTE ONLY): 22 min   Charges:   PT Evaluation $PT Eval Low Complexity: [place] PT Acute Rehabilitation Services Pager [phone] Office [phone]   Claretha Cooper 12/21/2020, 4:45 PM

## 2020-12-21 NOTE — Anesthesia Preprocedure Evaluation (Addendum)
Anesthesia Evaluation  Patient identified by MRN, date of birth, ID band Patient awake    Reviewed: Allergy & Precautions, NPO status , Unable to perform ROS - Chart review only  Airway Mallampati: II  TM Distance: >3 FB Neck ROM: Full    Dental no notable dental hx.    Pulmonary    Pulmonary exam normal breath sounds clear to auscultation       Cardiovascular METS: < 3 Mets hypertension, + Peripheral Vascular Disease  Normal cardiovascular exam Rhythm:Regular Rate:Normal     Neuro/Psych CVA (on ASA/Plavix; poor vision in right eye), Residual Symptoms negative psych ROS   GI/Hepatic Neg liver ROS, GERD  ,  Endo/Other  Hypothyroidism   Renal/GU Renal disease  negative genitourinary   Musculoskeletal  (+) Arthritis ,   Abdominal   Peds negative pediatric ROS (+)  Hematology  (+) anemia ,   Anesthesia Other Findings   Reproductive/Obstetrics                           Anesthesia Physical Anesthesia Plan  ASA: 3  Anesthesia Plan: Spinal, MAC and Regional   Post-op Pain Management:    Induction: Intravenous  PONV Risk Score and Plan: 2 and Propofol infusion and Treatment may vary due to age or medical condition  Airway Management Planned: Natural Airway and Simple Face Mask  Additional Equipment: None  Intra-op Plan:   Post-operative Plan:   Informed Consent: I have reviewed the patients History and Physical, chart, labs and discussed the procedure including the risks, benefits and alternatives for the proposed anesthesia with the patient or authorized representative who has indicated his/her understanding and acceptance.     Dental advisory given  Plan Discussed with: CRNA, Anesthesiologist and Surgeon  Anesthesia Plan Comments: (Last dose of Plavix 7 days ago. Plan for spinal. GA/LMA as backup plan. Norton Blizzard, MD  )      Anesthesia Quick Evaluation

## 2020-12-21 NOTE — Op Note (Signed)
TOTAL KNEE REPLACEMENT OPERATIVE NOTE:  12/21/2020  11:52 AM  PATIENT:  Amber Brown  87 y.o. female  PRE-OPERATIVE DIAGNOSIS:  Osteoarthritis of right knee M17.11  POST-OPERATIVE DIAGNOSIS:  Osteoarthritis of right knee M17.11  PROCEDURE:  Procedure(s): TOTAL KNEE ARTHROPLASTY  SURGEON:  Surgeon(s): Vickey Huger, MD  PHYSICIAN ASSISTANT: Carlyon Shadow, PA-C   ANESTHESIA:   spinal  SPECIMEN: None  COUNTS:  Correct  TOURNIQUET:   Total Tourniquet Time Documented: area (Right) - 37 minutes Total: area (Right) - 37 minutes   DICTATION:  Indication for procedure:    The patient is a 87 y.o. female who has failed conservative treatment for Osteoarthritis of right knee M17.11.  Informed consent was obtained prior to anesthesia. The risks versus benefits of the operation were explain and in a way the patient can, and did, understand.     Description of procedure:     The patient was taken to the operating room and placed under anesthesia.  The patient was positioned in the usual fashion taking care that all body parts were adequately padded and/or protected.  A tourniquet was applied and the leg prepped and draped in the usual sterile fashion.  The extremity was exsanguinated with the esmarch and tourniquet inflated to 250 mmHg.  Pre-operative range of motion was normal.   A midline incision approximately 6-7 inches long was made with a #10 blade.  A new blade was used to make a parapatellar arthrotomy going 2-3 cm into the quadriceps tendon, over the patella, and alongside the medial aspect of the patellar tendon.  A synovectomy was then performed with the #10 blade and forceps. I then elevated the deep MCL off the medial tibial metaphysis subperiosteally around to the semimembranosus attachment.    I everted the patella and used calipers to measure patellar thickness.  I used the reamer to ream down to appropriate thickness to recreate the native thickness.  I then removed  excess bone with the rongeur and sagittal saw.  I used the appropriately sized template and drilled the three lug holes.  I then put the trial in place and measured the thickness with the calipers to ensure recreation of the native thickness.  The trial was then removed and the patella subluxed and the knee brought into flexion.  A homan retractor was place to retract and protect the patella and lateral structures.  A Z-retractor was place medially to protect the medial structures.  The extra-medullary alignment system was used to make cut the tibial articular surface perpendicular to the anamotic axis of the tibia and in 3 degrees of posterior slope.  The cut surface and alignment jig was removed.  I then used the intramedullary alignment guide to make a valgus cut on the distal femur.  I then marked out the epicondylar axis on the distal femur.   I then used the anterior referencing sizer and measured the femur to be a size 5.  The 4-In-1 cutting block was screwed into place in external rotation matching the posterior condylar angle, making our cuts perpendicular to the epicondylar axis.  Anterior, posterior and chamfer cuts were made with the sagittal saw.  The cutting block and cut pieces were removed.  A lamina spreader was placed in 90 degrees of flexion.  The ACL, PCL, menisci, and posterior condylar osteophytes were removed.  A 10 mm spacer blocked was found to offer good flexion and extension gap balance after minimal in degree releasing.   The scoop retractor was then  placed and the femoral finishing block was pinned in place.  The small sagittal saw was used as well as the lug drill to finish the femur.  The block and cut surfaces were removed and the medullary canal hole filled with autograft bone from the cut pieces.  The tibia was delivered forward in deep flexion and external rotation.  A size E tray was selected and pinned into place centered on the medial 1/3 of the tibial tubercle.  The  reamer and keel was used to prepare the tibia through the tray.    I then trialed with the size 5 femur, size E tibia, a 10 mm insert and the 32 patella.  I had excellent flexion/extension gap balance, excellent patella tracking.  Flexion was full and beyond 120 degrees; extension was zero.  These components were chosen and the staff opened them to me on the back table while the knee was lavaged copiously and the cement mixed.  The soft tissue was infiltrated with 60cc of exparel 1.3% through a 21 gauge needle.  I cemented in the components and removed all excess cement.  The polyethylene tibial component was snapped into place and the knee placed in extension while cement was hardening.  The capsule was infilltrated with a 60cc exparel/marcaine/saline mixture.   Once the cement was hard, the tourniquet was let down.  Hemostasis was obtained.  The arthrotomy was closed using a #1 stratofix running suture.  The deep soft tissues were closed with #0 vicryls and the subcuticular layer closed with #2-0 vicryl.  The skin was reapproximated and closed with 3.0 Monocryl.  The wound was covered with steristrips, aquacel dressing, and a TED stocking.   The patient was then awakened, extubated, and taken to the recovery room in stable condition.  BLOOD LOSS:  740CX COMPLICATIONS:  None.  PLAN OF CARE: Admit for overnight observation  PATIENT DISPOSITION:  PACU - hemodynamically stable.   Please fax a copy of this op note to my office at [phone] (please only include page 1 and 2 of the Case Information op note)

## 2020-12-21 NOTE — Progress Notes (Signed)
Orthopedic Tech Progress Note Patient Details:  Amber Brown 1929-01-28 [id number]  CPM Right Knee CPM Right Knee: On Right Knee Flexion (Degrees): 90 Right Knee Extension (Degrees): 0 Additional Comments: foot roll  Post Interventions Patient Tolerated: Well Instructions Provided: Adjustment of device, Care of device  [name] 12/21/2020, 11:54 AM

## 2020-12-21 NOTE — Transfer of Care (Signed)
Immediate Anesthesia Transfer of Care Note  Patient: Amber Brown  Procedure(s) Performed: TOTAL KNEE ARTHROPLASTY (Right: Knee)  Patient Location: PACU  Anesthesia Type:Spinal and MAC combined with regional for post-op pain  Level of Consciousness: awake, alert  and oriented  Airway & Oxygen Therapy: Patient Spontanous Breathing and Patient connected to face mask oxygen  Post-op Assessment: Report given to RN and Post -op Vital signs reviewed and stable  Post vital signs: Reviewed and stable  Last Vitals:  Vitals Value Taken Time  BP 134/72 12/21/20 1113  Temp    Pulse 66 12/21/20 1117  Resp 17 12/21/20 1117  SpO2 100 % 12/21/20 1117  Vitals shown include unvalidated device data.  Last Pain:  Vitals:   12/21/20 0714  TempSrc: Oral  PainSc:          Complications: No notable events documented.

## 2020-12-21 NOTE — H&P (Signed)
Amber Brown MRN:  [id number] DOB/SEX:  15-Feb-1929/female  CHIEF COMPLAINT:  Painful right Knee  HISTORY: Patient is a 87 y.o. female presented with a history of pain in the right knee. Onset of symptoms was gradual starting a few years ago with gradually worsening course since that time. Patient has been treated conservatively with over-the-counter NSAIDs and activity modification. Patient currently rates pain in the knee at 10 out of 10 with activity. There is pain at night.  PAST MEDICAL HISTORY: Patient Active Problem List   Diagnosis Date Noted   Iliopsoas bursitis of left hip 08/31/2020   Legally blind 06/04/2020   Bursitis of hip, right 10/02/2019   BMI 27.0-27.9,adult 10/02/2019   Hypothyroidism, adult 05/28/2019   Mixed hyperlipidemia 05/28/2019   Adjustment disorder with depressed mood 05/28/2019   Peripheral vascular disease ([place]) 05/28/2019   GERD with esophagitis 05/28/2019   Anemia in stage 3b chronic kidney disease ([place]) 05/28/2019   Osteoporosis of forearm without pathological fracture 05/28/2019   Sequelae of cerebral infarction 05/28/2019   History of amputation of lesser toe of right foot ([place]) 02/28/2018   Deformity, toe acquired, right 01/12/2018   Diminished pulses in lower extremity 02/20/2017   Glaucoma 02/20/2017   Hypertension 02/20/2017   Degenerative scoliosis in adult patient 01/06/2015   Lumbar stenosis with neurogenic claudication 06/30/2014   Bilateral lower extremity pain 01/03/2014   Past Medical History:  Diagnosis Date   Arthritis    Degenerative scoliosis in adult patient 01/06/2015   GERD with esophagitis 05/28/2019   Glaucoma 02/20/2017   History of kidney stones    Hypertension 02/20/2017   Hypothyroidism, adult 05/28/2019   Mixed hyperlipidemia 05/28/2019   Osteoporosis of forearm without pathological fracture 05/28/2019   Peripheral vascular disease ([place]) 05/28/2019   Sequelae of cerebral infarction 05/28/2019   Stroke [facility])     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   Past Surgical History:  Procedure Laterality Date   ABDOMINAL HYSTERECTOMY     CHOLECYSTECTOMY       MEDICATIONS:   Medications Prior to Admission  Medication Sig Dispense Refill Last Dose   alendronate (FOSAMAX) 70 MG tablet TAKE 1 TABLET BY MOUTH ONCE A WEEK 12 tablet 3    aspirin 81 MG EC tablet Take 81 mg by mouth daily.       atorvastatin (LIPITOR) 40 MG tablet TAKE 1 TABLET BY MOUTH  DAILY 90 tablet 1    calcium carbonate (OSCAL) 1500 (600 Ca) MG TABS tablet Take 600 mg of elemental calcium by mouth daily with breakfast.      clopidogrel (PLAVIX) 75 MG tablet Take 1 tablet (75 mg total) by mouth daily. 90 tablet 2    dorzolamide-timolol (COSOPT) 22.3-6.8 MG/ML ophthalmic solution Place 1 drop into both eyes 2 (two) times daily.       levothyroxine (SYNTHROID) 50 MCG tablet Take 1 tablet (50 mcg total) by mouth daily. 90 tablet 2    meclizine (ANTIVERT) 25 MG tablet Take 1 tablet (25 mg total) by mouth 3 (three) times daily as needed for dizziness. 30 tablet 0    metoprolol succinate (TOPROL-XL) 25 MG 24 hr tablet Take 1 tablet (25 mg total) by mouth 2 (two) times daily. 180 tablet 2    Multiple Vitamins-Minerals (OCUVITE ADULT FORMULA PO) Take 1 tablet by mouth daily.      pantoprazole (PROTONIX) 40 MG tablet TAKE 1 TABLET BY MOUTH 1  TIMES DAILY 90 tablet 3    ROCKLATAN 0.02-0.005 % SOLN  Apply 1 drop to eye at bedtime.      sertraline (ZOLOFT) 25 MG tablet Take 1 tablet (25 mg total) by mouth daily. 30 tablet 3    SSD 1 % cream Apply topically daily.       ALLERGIES:   Allergies  Allergen Reactions   Meperidine Nausea Only    REVIEW OF SYSTEMS:  A comprehensive review of systems was negative except for: Musculoskeletal: positive for arthralgias and bone pain   FAMILY HISTORY:   Family History  Problem Relation Age of Onset   Aneurysm Mother    Bone cancer Father    Heart Problems Brother    Alcohol abuse Brother    Lung cancer  Brother     SOCIAL HISTORY:   Social History   Tobacco Use   Smoking status: Never   Smokeless tobacco: Never  Substance Use Topics   Alcohol use: Never     EXAMINATION:  Vital signs in last 24 hours:    LMP  (LMP Unknown)   General Appearance:    Alert, cooperative, no distress, appears stated age  Head:    Normocephalic, without obvious abnormality, atraumatic  Eyes:    PERRL, conjunctiva/corneas clear, EOM's intact, fundi    benign, both eyes  Ears:    Normal TM's and external ear canals, both ears  Nose:   Nares normal, septum midline, mucosa normal, no drainage    or sinus tenderness  Throat:   Lips, mucosa, and tongue normal; teeth and gums normal  Neck:   Supple, symmetrical, trachea midline, no adenopathy;    thyroid:  no enlargement/tenderness/nodules; no carotid   bruit or JVD  Back:     Symmetric, no curvature, ROM normal, no CVA tenderness  Lungs:     Clear to auscultation bilaterally, respirations unlabored  Chest Wall:    No tenderness or deformity   Heart:    Regular rate and rhythm, S1 and S2 normal, no murmur, rub   or gallop  Breast Exam:    No tenderness, masses, or nipple abnormality  Abdomen:     Soft, non-tender, bowel sounds active all four quadrants,    no masses, no organomegaly  Genitalia:    Normal female without lesion, discharge or tenderness  Rectal:    Normal tone, normal prostate, no masses or tenderness;   guaiac negative stool  Extremities:   Extremities normal, atraumatic, no cyanosis or edema  Pulses:   2+ and symmetric all extremities  Skin:   Skin color, texture, turgor normal, no rashes or lesions  Lymph nodes:   Cervical, supraclavicular, and axillary nodes normal  Neurologic:   CNII-XII intact, normal strength, sensation and reflexes    throughout    Musculoskeletal:  ROM 0-120, Ligaments intact,  Imaging Review Plain radiographs demonstrate severe degenerative joint disease of the right knee. The overall alignment is neutral.  The bone quality appears to be good for age and reported activity level.  Assessment/Plan: Primary osteoarthritis, right knee   The patient history, physical examination and imaging studies are consistent with advanced degenerative joint disease of the right knee. The patient has failed conservative treatment.  The clearance notes were reviewed.  After discussion with the patient it was felt that Total Knee Replacement was indicated. The procedure,  risks, and benefits of total knee arthroplasty were presented and reviewed. The risks including but not limited to aseptic loosening, infection, blood clots, vascular injury, stiffness, patella tracking problems complications among others were discussed. The patient acknowledged the  explanation, agreed to proceed with the plan.  Preoperative templating of the joint replacement has been completed, documented, and submitted to the Operating Room personnel in order to optimize intra-operative equipment management.    Patient's anticipated LOS is less than 2 midnights, meeting these requirements: - Lives within 1 hour of care - Has a competent adult at home to recover with post-op recover - NO history of  - Chronic pain requiring opiods  - Diabetes  - Coronary Artery Disease  - Heart failure  - Heart attack  - Stroke  - DVT/VTE  - Cardiac arrhythmia  - Respiratory Failure/COPD  - Renal failure  - Anemia  - Advanced Liver disease     [name] 12/21/2020, 7:03 AM

## 2020-12-21 NOTE — Anesthesia Procedure Notes (Addendum)
Anesthesia Regional Block: Adductor canal block   Pre-Anesthetic Checklist: , timeout performed,  Correct Patient, Correct Site, Correct Laterality,  Correct Procedure, Correct Position, site marked,  Risks and benefits discussed,  Surgical consent,  Pre-op evaluation,  At surgeon's request and post-op pain management  Laterality: Right  Prep: chloraprep       Needles:  Injection technique: Single-shot  Needle Type: Echogenic Stimulator Needle     Needle Length: 10cm  Needle Gauge: 20     Additional Needles:   Procedures:,,,, ultrasound used (permanent image in chart),,    Narrative:  Start time: 12/21/2020 8:40 AM End time: 12/21/2020 8:45 AM Injection made incrementally with aspirations every 5 mL.  Performed by: Personally  Anesthesiologist: Merlinda Frederick, MD  Additional Notes: A functioning IV was confirmed and monitors were applied.  Sterile prep and drape, hand hygiene and sterile gloves were used.  Negative aspiration and test dose prior to incremental administration of local anesthetic. The patient tolerated the procedure well.Ultrasound  guidance: relevant anatomy identified, needle position confirmed, local anesthetic spread visualized around nerve(s), vascular puncture avoided.  Image printed for medical record.

## 2020-12-21 NOTE — Progress Notes (Signed)
Assisted Dr. Elgie Congo with right, ultrasound guided, adductor canal block. Side rails up, monitors on throughout procedure. See vital signs in flow sheet. Tolerated Procedure well.

## 2020-12-21 NOTE — Anesthesia Postprocedure Evaluation (Signed)
Anesthesia Post Note  Patient: Amber Brown  Procedure(s) Performed: TOTAL KNEE ARTHROPLASTY (Right: Knee)     Patient location during evaluation: Nursing Unit Anesthesia Type: Regional and Spinal Level of consciousness: oriented and awake and alert Pain management: pain level controlled Vital Signs Assessment: post-procedure vital signs reviewed and stable Respiratory status: spontaneous breathing and respiratory function stable Cardiovascular status: blood pressure returned to baseline and stable Postop Assessment: no headache, no backache, no apparent nausea or vomiting and patient able to bend at knees Anesthetic complications: no   No notable events documented.  Last Vitals:  Vitals:   12/21/20 1300 12/21/20 1323  BP: (!) 164/70 137/74  Pulse: (!) 58 60  Resp: 11 15  Temp:  (!) 36.4 C  SpO2: 97% 100%    Last Pain:  Vitals:   12/21/20 1336  TempSrc:   PainSc: 0-No pain                 Merlinda Frederick

## 2020-12-21 NOTE — Anesthesia Procedure Notes (Signed)
Spinal  Patient location during procedure: OR Start time: 12/21/2020 9:40 AM End time: 12/21/2020 9:45 AM Reason for block: surgical anesthesia Staffing Performed: anesthesiologist  Anesthesiologist: Merlinda Frederick, MD Preanesthetic Checklist Completed: patient identified, IV checked, risks and benefits discussed, surgical consent, monitors and equipment checked, pre-op evaluation and timeout performed Spinal Block Patient position: sitting Prep: DuraPrep Patient monitoring: cardiac monitor, continuous pulse ox and blood pressure Approach: midline Location: L3-4 Injection technique: single-shot Needle Needle type: Pencan  Needle gauge: 24 G Needle length: 9 cm Assessment Events: CSF return Additional Notes Functioning IV was confirmed and monitors were applied. Sterile prep and drape, including hand hygiene and sterile gloves were used. The patient was positioned and the spine was prepped. The skin was anesthetized with lidocaine.  Free flow of clear CSF was obtained prior to injecting local anesthetic into the CSF.  The spinal needle aspirated freely following injection.  The needle was carefully withdrawn.  The patient tolerated the procedure well.

## 2020-12-22 ENCOUNTER — Encounter (HOSPITAL_COMMUNITY): Payer: Self-pay | Admitting: Orthopedic Surgery

## 2020-12-22 DIAGNOSIS — Z79899 Other long term (current) drug therapy: Secondary | ICD-10-CM | POA: Diagnosis not present

## 2020-12-22 DIAGNOSIS — M1711 Unilateral primary osteoarthritis, right knee: Secondary | ICD-10-CM | POA: Diagnosis not present

## 2020-12-22 DIAGNOSIS — I10 Essential (primary) hypertension: Secondary | ICD-10-CM | POA: Diagnosis not present

## 2020-12-22 DIAGNOSIS — Z96651 Presence of right artificial knee joint: Secondary | ICD-10-CM | POA: Diagnosis not present

## 2020-12-22 LAB — BASIC METABOLIC PANEL
Anion gap: 5 (ref 5–15)
BUN: 15 mg/dL (ref 8–23)
CO2: 23 mmol/L (ref 22–32)
Calcium: 8.8 mg/dL — ABNORMAL LOW (ref 8.9–10.3)
Chloride: 113 mmol/L — ABNORMAL HIGH (ref 98–111)
Creatinine, Ser: 0.7 mg/dL (ref 0.44–1.00)
GFR, Estimated: >60 mL/min (ref >60)
Glucose, Bld: 159 mg/dL — ABNORMAL HIGH (ref 70–99)
Potassium: 4.4 mmol/L (ref 3.5–5.1)
Sodium: 141 mmol/L (ref 135–145)

## 2020-12-22 LAB — CBC
HCT: 30.8 % — ABNORMAL LOW (ref 36.0–46.0)
Hemoglobin: 10.2 g/dL — ABNORMAL LOW (ref 12.0–15.0)
MCH: 31.5 pg (ref 26.0–34.0)
MCHC: 33.1 g/dL (ref 30.0–36.0)
MCV: 95.1 fL (ref 80.0–100.0)
Platelets: 253 10*3/uL (ref 150–400)
RBC: 3.24 MIL/uL — ABNORMAL LOW (ref 3.87–5.11)
RDW: 12.2 % (ref 11.5–15.5)
WBC: 20.9 10*3/uL — ABNORMAL HIGH (ref 4.0–10.5)
nRBC: 0 % (ref 0.0–0.2)

## 2020-12-22 MED ORDER — METHOCARBAMOL 500 MG PO TABS: 500.0000 mg | ORAL_TABLET | Freq: Four times a day (QID) | ORAL | 0 refills | Status: DC | PRN

## 2020-12-22 MED ORDER — OXYCODONE HCL 5 MG PO TABS: 5.0000 mg | ORAL_TABLET | Freq: Four times a day (QID) | ORAL | 0 refills | Status: DC | PRN

## 2020-12-22 NOTE — TOC Transition Note (Signed)
Transition of Care [facility]) - CM/SW Discharge Note   Patient Details  Name: Amber Brown MRN: [id number] Date of Birth: [birth year]  [facility] CM/SW Contact:  Lennart Pall, LCSW Phone Number: 12/22/2020, 1:40 PM   Clinical Narrative:     Met with pt and daughter and confirming pt has all needed DME at home.  Plans for OPPT at Dr. Ruel Favors office.  No TOC needs.  Final next level of care: OP Rehab Barriers to Discharge: No Barriers Identified   Patient Goals and CMS Choice Patient states their goals for this hospitalization and ongoing recovery are:: return home      Discharge Placement                       Discharge Plan and Services                DME Arranged: N/A DME Agency: NA                  Social Determinants of Health (SDOH) Interventions     Readmission Risk Interventions No flowsheet data found.

## 2020-12-22 NOTE — Progress Notes (Signed)
SPORTS MEDICINE AND JOINT REPLACEMENT  Lara Mulch, MD    Carlyon Shadow, PA-C [address], [place], [place]  [address]                             [phone]   PROGRESS NOTE  Subjective:  negative for Chest Pain  negative for Shortness of Breath  negative for Nausea/Vomiting   negative for Calf Pain  negative for Bowel Movement   Tolerating Diet: yes         Patient reports pain as 3 on 0-10 scale.    Objective: Vital signs in last 24 hours:   Patient Vitals for the past 24 hrs:  BP Temp Temp src Pulse Resp SpO2  12/22/20 1000 (!) 157/67 97.7 F (36.5 C) Oral 85 16 99 %  12/22/20 0524 (!) 147/67 97.7 F (36.5 C) Oral 82 14 99 %  12/22/20 0142 (!) 122/59 97.9 F (36.6 C) Oral 75 15 98 %  12/21/20 2109 137/77 97.7 F (36.5 C) Oral 76 15 100 %  12/21/20 1537 (!) 157/68 98.6 F (37 C) -- 68 18 100 %  12/21/20 1323 137/74 (!) 97.5 F (36.4 C) Oral 60 15 100 %  12/21/20 1300 (!) 164/70 -- -- (!) 58 11 97 %  12/21/20 1245 (!) 164/68 -- -- (!) 57 12 98 %    @flow {1959:LAST@   Intake/Output from previous day:   10/03 0701 - 10/04 0700 In: 2668.5 [P.O.:460; I.V.:2008.5] Out: 3000 [Urine:2950]   Intake/Output this shift:   10/04 0701 - 10/04 1900 In: 390 [P.O.:390] Out: 100 [Urine:100]   Intake/Output      10/03 0701 10/04 0700 10/04 0701 10/05 0700   P.O. 460 390   I.V. (mL/kg) 2008.5 (34)    IV Piggyback 200    Total Intake(mL/kg) 2668.5 (45.2) 390 (6.6)   Urine (mL/kg/hr) 2950 (2.1) 100 (0.3)   Stool  0   Blood 50    Total Output 3000 100   Net -331.5 +290        Urine Occurrence  0 x   Stool Occurrence  0 x      LABORATORY DATA: Recent Labs    12/15/20 1445 12/22/20 0324  WBC 10.9* 20.9*  HGB 13.7 10.2*  HCT 43.6 30.8*  PLT 317 253   Recent Labs    12/15/20 1445 12/22/20 0324  NA 140 141  K 4.1 4.4  CL 106 113*  CO2 26 23  BUN 21 15  CREATININE 0.87 0.70  GLUCOSE 142* 159*  CALCIUM 9.8 8.8*   No results found for: INR,  PROTIME  Examination:  General appearance: alert, cooperative, and no distress Extremities: extremities normal, atraumatic, no cyanosis or edema  Wound Exam: clean, dry, intact   Drainage:  None: wound tissue dry  Motor Exam: Quadriceps and Hamstrings Intact  Sensory Exam: Superficial Peroneal, Deep Peroneal, and Tibial normal   Assessment:    1 Day Post-Op  Procedure(s) (LRB): TOTAL KNEE ARTHROPLASTY (Right)  ADDITIONAL DIAGNOSIS:  Active Problems:   S/P total knee replacement     Plan: Physical Therapy as ordered Weight Bearing as Tolerated (WBAT)  DVT Prophylaxis:  Aspirin  DISCHARGE PLAN: Home  Patient ready for D/C home       Patient's anticipated LOS is less than 2 midnights, meeting these requirements:  - Lives within 1 hour of care - Has a competent adult at home to recover with post-op recover -  NO history of  - Chronic pain requiring opiods  - Diabetes  - Coronary Artery Disease  - Heart failure  - Heart attack  - Stroke  - DVT/VTE  - Cardiac arrhythmia  - Respiratory Failure/COPD  - Renal failure  - Anemia  - Advanced Liver disease      [name] 12/22/2020, 12:43 PM

## 2020-12-22 NOTE — Progress Notes (Signed)
Physical Therapy Treatment Patient Details Name: Amber Brown MRN: [id number] DOB: 07/17/1928 Today's Date: 12/22/2020   History of Present Illness Amber Brown is 87 YO female, S?P RTKA on 12/21/20, PMH: glaucoma, stroke, PVD,scoliosis    PT Comments    POD # 1 am session General Comments: initially disoriented as she just woke up and didn't know what time/day it was.  Required increased time and orientation to clear.  Following all commands and pleasant. General bed mobility comments: pt self able to transition to EOB with increased time. General transfer comment: pt physically able to self rise but needed VC's and tactile cueing allong with direction due to low vision.  First assisted to [facility] 1/4 which pt did well feeling with her hands.  Then assisted off BSC to amb with hand cueing to transition hands to walker. General Gait Details: pt amb 45 feet with walker with Therapist to direct/guide walker due to low vision.  Pt tolerated distance well with minimal knee pain. Performed a few TE's followed by ICE. Pt plans to D/C back home where she lives with a Niece who can provide 24/7 care/assist.   Recommendations for follow up therapy are one component of a multi-disciplinary discharge planning process, led by the attending physician.  Recommendations may be updated based on patient status, additional functional criteria and insurance authorization.  Follow Up Recommendations  Follow surgeon's recommendation for DC plan and follow-up therapies     Equipment Recommendations  None recommended by PT    Recommendations for Other Services       Precautions / Restrictions Precautions Precautions: Fall;Knee Precaution Comments: low vision due to glaucoma, neice lives with patient and states that patient will need assistance to ambulate safely using RW     Mobility  Bed Mobility Overal bed mobility: Needs Assistance Bed Mobility: Supine to Sit     Supine to sit: Supervision     General  bed mobility comments: pt self able to transition to EOB with increased time.    Transfers Overall transfer level: Needs assistance Equipment used: Rolling walker (2 wheeled) Transfers: Sit to/from [facility] Sit to Stand: Min guard Stand pivot transfers: Min guard;Min assist       General transfer comment: pt physically able to self rise but needed VC's and tactile cueing allong with direction due to low vision.  First assisted to [facility] 1/4 which pt did well feeling with her hands.  Then assisted off BSC to amb with hand cueing to transition hands to walker.  Ambulation/Gait Ambulation/Gait assistance: Min guard;Min assist Gait Distance (Feet): 45 Feet Assistive device: Rolling walker (2 wheeled) Gait Pattern/deviations: Step-to pattern;Trunk flexed;Decreased stance time - right Gait velocity: decreased   General Gait Details: pt amb 45 feet with walker with Therapist to direct/guide walker due to low vision.  Pt tolerated distance well with minimal knee pain.   Stairs Stairs:  (NO stairs to enter home)           Wheelchair Mobility    Modified Rankin (Stroke Patients Only)       Balance                                            Cognition Arousal/Alertness: Awake/alert Behavior During Therapy: WFL for tasks assessed/performed Overall Cognitive Status: Within Functional Limits for tasks assessed  General Comments: initially disoriented as she just woke up and didn't know what time/day it was.  Required increased time and orientation to clear.  Following all commands and pleasant.      Exercises  Total Knee Replacement TE's following HEP handout 10 reps B LE ankle pumps 05 reps towel squeezes 05 reps knee presses 05 reps heel slides  05 reps SLR's 05 reps ABD Educated on use of gait belt to assist with TE's Followed by ICE     General Comments        Pertinent Vitals/Pain  Pain Assessment: Faces Faces Pain Scale: Hurts a little bit Pain Location: right knee Pain Descriptors / Indicators: Discomfort;Operative site guarding Pain Intervention(s): Monitored during session;Premedicated before session;Repositioned;Ice applied    Home Living                      Prior Function            PT Goals (current goals can now be found in the care plan section) Progress towards PT goals: Progressing toward goals    Frequency    7X/week      PT Plan Current plan remains appropriate    Co-evaluation              AM-PAC PT "6 Clicks" Mobility   Outcome Measure  Help needed turning from your back to your side while in a flat bed without using bedrails?: A Little Help needed moving from lying on your back to sitting on the side of a flat bed without using bedrails?: A Little Help needed moving to and from a bed to a chair (including a wheelchair)?: A Little Help needed standing up from a chair using your arms (e.g., wheelchair or bedside chair)?: A Little Help needed to walk in hospital room?: A Little Help needed climbing 3-5 steps with a railing? : A Little 6 Click Score: 18    End of Session Equipment Utilized During Treatment: Gait belt   Patient left: in chair;with call bell/phone within reach;with chair alarm set Nurse Communication: Mobility status PT Visit Diagnosis: Unsteadiness on feet (R26.81);Other symptoms and signs involving the nervous system (R29.898)     Time: 0973-5329 PT Time Calculation (min) (ACUTE ONLY): 36 min  Charges:  $Gait Training: 8-22 mins $Therapeutic Activity: 8-22 mins                     Rica Koyanagi  PTA Acute  Rehabilitation Services Pager      [phone] Office      [phone]

## 2020-12-22 NOTE — Discharge Summary (Signed)
SPORTS MEDICINE & JOINT REPLACEMENT   Lara Mulch, MD   Carlyon Shadow, PA-C [place], [place], [place]  [address]                             [phone]  PATIENT ID: Amber Brown        MRN:  [id number]          DOB/AGE: 1928-08-14 / 87 y.o.    DISCHARGE SUMMARY  ADMISSION DATE:    12/21/2020 DISCHARGE DATE:   12/22/2020   ADMISSION DIAGNOSIS: S/P total knee replacement [Z96.659]    DISCHARGE DIAGNOSIS:  Osteoarthritis of right knee M17.11    ADDITIONAL DIAGNOSIS: Active Problems:   S/P total knee replacement  Past Medical History:  Diagnosis Date   Arthritis    Degenerative scoliosis in adult patient 01/06/2015   GERD with esophagitis 05/28/2019   Glaucoma 02/20/2017   History of kidney stones    Hypertension 02/20/2017   Hypothyroidism, adult 05/28/2019   Mixed hyperlipidemia 05/28/2019   Osteoporosis of forearm without pathological fracture 05/28/2019   Peripheral vascular disease ([place]) 05/28/2019   Sequelae of cerebral infarction 05/28/2019   Stroke [facility])    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    PROCEDURE: Procedure(s): TOTAL KNEE ARTHROPLASTY on 12/21/2020  CONSULTS:    HISTORY:  See H&P in chart  HOSPITAL COURSE:  Amber Brown is a 87 y.o. admitted on 12/21/2020 and found to have a diagnosis of Osteoarthritis of right knee M17.11.  After appropriate laboratory studies were obtained  they were taken to the operating room on 12/21/2020 and underwent Procedure(s): TOTAL KNEE ARTHROPLASTY.   They were given perioperative antibiotics:  Anti-infectives (From admission, onward)    Start     Dose/Rate Route Frequency Ordered Stop   12/21/20 0700  ceFAZolin (ANCEF) IVPB 2g/100 mL premix        2 g 200 mL/hr over 30 Minutes Intravenous On call to O.R. 12/21/20 7096 12/21/20 1016     .  Patient given tranexamic acid IV or topical and exparel intra-operatively.  Tolerated the procedure well.    POD# 1: Vital signs were stable.  Patient denied Chest  pain, shortness of breath, or calf pain.  Patient was started on Aspirin twice daily at 8am.  Consults to PT, OT, and care management were made.  The patient was weight bearing as tolerated.  CPM was placed on the operative leg 0-90 degrees for 6-8 hours a day. When out of the CPM, patient was placed in the foam block to achieve full extension. Incentive spirometry was taught.  Dressing was changed.       POD #2, Continued  PT for ambulation and exercise program.  IV saline locked.  O2 discontinued.    The remainder of the hospital course was dedicated to ambulation and strengthening.   The patient was discharged on 1 Day Post-Op in  Good condition.  Blood products given:none  DIAGNOSTIC STUDIES: Recent vital signs: Patient Vitals for the past 24 hrs:  BP Temp Temp src Pulse Resp SpO2  12/22/20 1000 (!) 157/67 97.7 F (36.5 C) Oral 85 16 99 %  12/22/20 0524 (!) 147/67 97.7 F (36.5 C) Oral 82 14 99 %  12/22/20 0142 (!) 122/59 97.9 F (36.6 C) Oral 75 15 98 %  12/21/20 2109 137/77 97.7 F (36.5 C) Oral 76 15 100 %  12/21/20 1537 (!) 157/68 98.6 F (37 C) --  68 18 100 %  12/21/20 1323 137/74 (!) 97.5 F (36.4 C) Oral 60 15 100 %  12/21/20 1300 (!) 164/70 -- -- (!) 58 11 97 %       Recent laboratory studies: Recent Labs    12/15/20 1445 12/22/20 0324  WBC 10.9* 20.9*  HGB 13.7 10.2*  HCT 43.6 30.8*  PLT 317 253   Recent Labs    12/15/20 1445 12/22/20 0324  NA 140 141  K 4.1 4.4  CL 106 113*  CO2 26 23  BUN 21 15  CREATININE 0.87 0.70  GLUCOSE 142* 159*  CALCIUM 9.8 8.8*   No results found for: INR, PROTIME   Recent Radiographic Studies :  No results found.  DISCHARGE INSTRUCTIONS:   DISCHARGE MEDICATIONS:      FOLLOW UP VISIT:    DISPOSITION: HOME VS. SNF  Dental Antibiotics:  In most cases prophylactic antibiotics for Dental procdeures after total joint surgery are not necessary.  Exceptions are as follows:  1. History of prior total joint  infection  2. Severely immunocompromised (Organ Transplant, cancer chemotherapy, Rheumatoid biologic meds such as [place])  3. Poorly controlled diabetes (A1C &gt; 8.0, blood glucose over 200)  If you have one of these conditions, contact your surgeon for an antibiotic prescription, prior to your dental procedure.   CONDITION:  Good   [name] 12/22/2020, 12:45 PM

## 2020-12-29 ENCOUNTER — Telehealth: Payer: Self-pay

## 2020-12-29 NOTE — Chronic Care Management (AMB) (Signed)
Chronic Care Management Pharmacy Assistant   Name: Amber Brown  MRN: [id number] DOB: 07-23-1928   Reason for Encounter: General Adherence Call    Recent office visits:  None since 12/03/20  Recent consult visits:  12/17/20 (Orthopedic) [name] PA-C. Seen for Chronic Knee Pain. No med changes.  Hospital visits:  Medication Reconciliation was completed by comparing discharge summary, patient's EMR and Pharmacy list, and upon discussion with patient.  Admitted to the hospital on 12/21/20 due to total knee replacement surgery. Discharge date was 12/22/20. Discharged from [place]?Medications Started at [facility] Discharge:?? -started methocarbamol (ROBAXIN) 500 mg 1-2 tablets every 6 hours prn and oxyCODONE (Oxy IR/ROXICODONE) 5 mg 1-2 tablets every 6 hours prn.  -All other medications will remain the same.    Medications: Outpatient Encounter Medications as of 12/29/2020  Medication Sig Note   alendronate (FOSAMAX) 70 MG tablet TAKE 1 TABLET BY MOUTH ONCE A WEEK (Patient taking differently: 70 mg once a week. Friday)    aspirin 81 MG EC tablet Take 81 mg by mouth daily.     atorvastatin (LIPITOR) 40 MG tablet TAKE 1 TABLET BY MOUTH  DAILY (Patient taking differently: Take 40 mg by mouth daily.)    calcium carbonate (OSCAL) 1500 (600 Ca) MG TABS tablet Take 600 mg of elemental calcium by mouth daily with breakfast.    clopidogrel (PLAVIX) 75 MG tablet Take 1 tablet (75 mg total) by mouth daily.    dorzolamide-timolol (COSOPT) 22.3-6.8 MG/ML ophthalmic solution Place 1 drop into both eyes 2 (two) times daily.     levothyroxine (SYNTHROID) 50 MCG tablet Take 1 tablet (50 mcg total) by mouth daily.    meclizine (ANTIVERT) 25 MG tablet Take 1 tablet (25 mg total) by mouth 3 (three) times daily as needed for dizziness.    methocarbamol (ROBAXIN) 500 MG tablet Take 1-2 tablets (500-1,000 mg total) by mouth every 6 (six) hours as needed for muscle  spasms.    metoprolol succinate (TOPROL-XL) 25 MG 24 hr tablet Take 1 tablet (25 mg total) by mouth 2 (two) times daily.    Multiple Vitamins-Minerals (OCUVITE ADULT FORMULA PO) Take 1 tablet by mouth daily.    oxyCODONE (OXY IR/ROXICODONE) 5 MG immediate release tablet Take 1-2 tablets (5-10 mg total) by mouth every 6 (six) hours as needed for moderate pain (pain score 4-6).    pantoprazole (PROTONIX) 40 MG tablet TAKE 1 TABLET BY MOUTH 1  TIMES DAILY (Patient taking differently: Take 40 mg by mouth daily.)    ROCKLATAN 0.02-0.005 % SOLN Place 1 drop into both eyes at bedtime.    sertraline (ZOLOFT) 25 MG tablet Take 1 tablet (25 mg total) by mouth daily. 08/25/2020: Patient never picked up or began treatment. Patient denies need for medication.    SSD 1 % cream Apply topically daily as needed (abrasions).    No facility-administered encounter medications on file as of 12/29/2020.   [place] for general disease state and medication adherence call.   Patient is > 5 days past due for refill on the following medications per chart history:  Star Medications: Medication Name/mg Last Fill Days Supply Atorvastatin 40 mg   09/11/20 90ds  What concerns do you have about your medications? No concerns   The patient reports she is having some nausea taking pain meds but she was prescribed something to help with nausea for now.  How often do you forget or accidentally miss  a dose? Never  Do you use a pillbox? Pt is using a pillbox and her niece organizes these for her  Are you having any problems getting your medications from your pharmacy? No issues   Has the cost of your medications been a concern? No  Since last visit with CPP, the following interventions have been made: The pt just had total knee replacement  and is undergoing physical therapy and she stated she is in a lot of pain while healing from surgery.   The patient has had an ED visit since last contact.   The patient  denies problems with their health.    Last annual wellness visit? None noted If applicable:N/A Last eye exam / retinopathy screening? Diabetic foot exam?     Elray Mcgregor, [place] Pharmacist Assistant  [phone]

## 2020-12-30 DIAGNOSIS — M25561 Pain in right knee: Secondary | ICD-10-CM | POA: Diagnosis not present

## 2020-12-30 DIAGNOSIS — M6281 Muscle weakness (generalized): Secondary | ICD-10-CM | POA: Diagnosis not present

## 2020-12-30 DIAGNOSIS — M25562 Pain in left knee: Secondary | ICD-10-CM | POA: Diagnosis not present

## 2020-12-31 DIAGNOSIS — Z96651 Presence of right artificial knee joint: Secondary | ICD-10-CM | POA: Diagnosis not present

## 2021-01-01 DIAGNOSIS — M25561 Pain in right knee: Secondary | ICD-10-CM | POA: Diagnosis not present

## 2021-01-01 DIAGNOSIS — M6281 Muscle weakness (generalized): Secondary | ICD-10-CM | POA: Diagnosis not present

## 2021-01-05 DIAGNOSIS — N3091 Cystitis, unspecified with hematuria: Secondary | ICD-10-CM | POA: Diagnosis not present

## 2021-01-05 DIAGNOSIS — N3001 Acute cystitis with hematuria: Secondary | ICD-10-CM | POA: Diagnosis not present

## 2021-01-06 DIAGNOSIS — M6281 Muscle weakness (generalized): Secondary | ICD-10-CM | POA: Diagnosis not present

## 2021-01-06 DIAGNOSIS — M25561 Pain in right knee: Secondary | ICD-10-CM | POA: Diagnosis not present

## 2021-01-08 ENCOUNTER — Other Ambulatory Visit: Payer: Self-pay | Admitting: Legal Medicine

## 2021-01-11 ENCOUNTER — Ambulatory Visit (INDEPENDENT_AMBULATORY_CARE_PROVIDER_SITE_OTHER): Payer: Medicare Other | Admitting: Legal Medicine

## 2021-01-11 ENCOUNTER — Encounter: Payer: Self-pay | Admitting: Legal Medicine

## 2021-01-11 ENCOUNTER — Other Ambulatory Visit: Payer: Self-pay

## 2021-01-11 VITALS — BP 118/70 | HR 99 | Temp 97.5°F | Resp 15 | Ht 60.0 in | Wt 127.0 lb

## 2021-01-11 DIAGNOSIS — F4321 Adjustment disorder with depressed mood: Secondary | ICD-10-CM

## 2021-01-11 DIAGNOSIS — N3000 Acute cystitis without hematuria: Secondary | ICD-10-CM | POA: Diagnosis not present

## 2021-01-11 DIAGNOSIS — F5102 Adjustment insomnia: Secondary | ICD-10-CM | POA: Diagnosis not present

## 2021-01-11 LAB — POCT URINALYSIS DIP (CLINITEK)
Blood, UA: NEGATIVE
Glucose, UA: 500 mg/dL — AB
Nitrite, UA: POSITIVE — AB
POC PROTEIN,UA: 100 — AB
Spec Grav, UA: 1.025 (ref 1.010–1.025)
Urobilinogen, UA: >=8 E.U./dL — AB
pH, UA: 5 (ref 5.0–8.0)

## 2021-01-11 MED ORDER — ZOLPIDEM TARTRATE 5 MG PO TABS: 5.0000 mg | ORAL_TABLET | Freq: Every evening | ORAL | 1 refills | Status: DC | PRN

## 2021-01-11 MED ORDER — SERTRALINE HCL 25 MG PO TABS: 25.0000 mg | ORAL_TABLET | Freq: Every day | ORAL | 3 refills | Status: DC

## 2021-01-11 MED ORDER — CIPROFLOXACIN HCL 500 MG PO TABS: 500.0000 mg | ORAL_TABLET | Freq: Two times a day (BID) | ORAL | 0 refills | Status: AC

## 2021-01-11 MED ORDER — CEFTRIAXONE SODIUM 1 G IJ SOLR
1.0000 g | Freq: Once | INTRAMUSCULAR | Status: AC
  Administered 2021-01-11: 1 g via INTRAMUSCULAR

## 2021-01-11 MED ORDER — SSD 1 % EX CREA: TOPICAL_CREAM | Freq: Every day | CUTANEOUS | 1 refills | Status: DC | PRN

## 2021-01-11 MED ORDER — ONDANSETRON 4 MG PO TBDP: 4.0000 mg | ORAL_TABLET | Freq: Three times a day (TID) | ORAL | 2 refills | Status: DC | PRN

## 2021-01-11 NOTE — Progress Notes (Signed)
Acute Office Visit  Subjective:    Patient ID: Amber Brown, female    DOB: [birth year], 87 y.o.   MRN: [id number]  Chief Complaint  Patient presents with   Insomnia   Urinary Tract Infection    HPI: Patient is in today for UTI.  She pyuria, ketosis and glucoseuria. She was seen at urgent care and started on bactrim.  Insomnia increased after surgery to knees.  She is not on any sleeping medicine.  Past Medical History:  Diagnosis Date   Arthritis    Degenerative scoliosis in adult patient 01/06/2015   GERD with esophagitis 05/28/2019   Glaucoma 02/20/2017   History of kidney stones    Hypertension 02/20/2017   Hypothyroidism, adult 05/28/2019   Mixed hyperlipidemia 05/28/2019   Osteoporosis of forearm without pathological fracture 05/28/2019   Peripheral vascular disease () 05/28/2019   Sequelae of cerebral infarction 05/28/2019   Stroke [facility])    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    Past Surgical History:  Procedure Laterality Date   ABDOMINAL HYSTERECTOMY     CHOLECYSTECTOMY     TOTAL KNEE ARTHROPLASTY Right 12/21/2020   Procedure: TOTAL KNEE ARTHROPLASTY;  Surgeon: Vickey Huger, MD;  Location: [facility] ORS;  Service: Orthopedics;  Laterality: Right;    Family History  Problem Relation Age of Onset   Aneurysm Mother    Bone cancer Father    Heart Problems Brother    Alcohol abuse Brother    Lung cancer Brother     Social History   Socioeconomic History   Marital status: Widowed    Spouse name: Not on file   Number of children: Not on file   Years of education: Not on file   Highest education level: Not on file  Occupational History   Not on file  Tobacco Use   Smoking status: Never   Smokeless tobacco: Never  Vaping Use   Vaping Use: Never used  Substance and Sexual Activity   Alcohol use: Never   Drug use: Never   Sexual activity: Not Currently  Other Topics Concern   Not on file  Social History Narrative   Not on file   Social Determinants  of Health   Financial Resource Strain: Not on file  Food Insecurity: Not on file  Transportation Needs: Not on file  Physical Activity: Not on file  Stress: Not on file  Social Connections: Not on file  Intimate Partner Violence: Not on file    Outpatient Medications Prior to Visit  Medication Sig Dispense Refill   alendronate (FOSAMAX) 70 MG tablet TAKE 1 TABLET BY MOUTH  WEEKLY 12 tablet 3   aspirin 81 MG EC tablet Take 81 mg by mouth daily.      atorvastatin (LIPITOR) 40 MG tablet TAKE 1 TABLET BY MOUTH  DAILY (Patient taking differently: Take 40 mg by mouth daily.) 90 tablet 1   calcium carbonate (OSCAL) 1500 (600 Ca) MG TABS tablet Take 600 mg of elemental calcium by mouth daily with breakfast.     clopidogrel (PLAVIX) 75 MG tablet Take 1 tablet (75 mg total) by mouth daily. 90 tablet 2   dorzolamide-timolol (COSOPT) 22.3-6.8 MG/ML ophthalmic solution Place 1 drop into both eyes 2 (two) times daily.      levothyroxine (SYNTHROID) 50 MCG tablet Take 1 tablet (50 mcg total) by mouth daily. 90 tablet 2   meclizine (ANTIVERT) 25 MG tablet Take 1 tablet (25 mg total) by mouth 3 (three) times daily  as needed for dizziness. 30 tablet 0   metoprolol succinate (TOPROL-XL) 25 MG 24 hr tablet Take 1 tablet (25 mg total) by mouth 2 (two) times daily. 180 tablet 2   Multiple Vitamins-Minerals (OCUVITE ADULT FORMULA PO) Take 1 tablet by mouth daily.     ondansetron (ZOFRAN-ODT) 4 MG disintegrating tablet Take 4 mg by mouth every 8 (eight) hours as needed.     pantoprazole (PROTONIX) 40 MG tablet TAKE 1 TABLET BY MOUTH 1  TIMES DAILY (Patient taking differently: Take 40 mg by mouth daily.) 90 tablet 3   ROCKLATAN 0.02-0.005 % SOLN Place 1 drop into both eyes at bedtime.     sertraline (ZOLOFT) 25 MG tablet Take 1 tablet (25 mg total) by mouth daily. 30 tablet 3   SSD 1 % cream Apply topically daily as needed (abrasions).     methocarbamol (ROBAXIN) 500 MG tablet Take 1-2 tablets (500-1,000 mg  total) by mouth every 6 (six) hours as needed for muscle spasms. 60 tablet 0   oxyCODONE (OXY IR/ROXICODONE) 5 MG immediate release tablet Take 1-2 tablets (5-10 mg total) by mouth every 6 (six) hours as needed for moderate pain (pain score 4-6). 40 tablet 0   sulfamethoxazole-trimethoprim (BACTRIM DS) 800-160 MG tablet Take 1 tablet by mouth 2 (two) times daily.     No facility-administered medications prior to visit.    Allergies  Allergen Reactions   Meperidine Nausea Only    Review of Systems  Constitutional:  Positive for appetite change. Negative for chills, fatigue and fever.  HENT:  Negative for congestion, ear pain and sore throat.   Respiratory:  Negative for cough and shortness of breath.   Cardiovascular:  Negative for chest pain and palpitations.  Gastrointestinal:  Positive for constipation and nausea. Negative for abdominal pain, diarrhea and vomiting.  Endocrine: Negative for polydipsia, polyphagia and polyuria.  Genitourinary:  Positive for dysuria. Negative for difficulty urinating.  Musculoskeletal:  Positive for arthralgias. Negative for back pain and myalgias.  Skin:  Negative for rash.  Neurological:  Negative for numbness and headaches.  Psychiatric/Behavioral:  Negative for dysphoric mood. The patient is not nervous/anxious.       Objective:    Physical Exam Vitals reviewed.  Constitutional:      Appearance: Normal appearance.  HENT:     Head: Atraumatic.     Right Ear: Tympanic membrane, ear canal and external ear normal.     Left Ear: Tympanic membrane, ear canal and external ear normal.     Nose: Nose normal.     Mouth/Throat:     Mouth: Mucous membranes are moist.     Pharynx: Oropharynx is clear.  Eyes:     Extraocular Movements: Extraocular movements intact.     Conjunctiva/sclera: Conjunctivae normal.     Pupils: Pupils are equal, round, and reactive to light.  Cardiovascular:     Rate and Rhythm: Normal rate and regular rhythm.     Pulses:  Normal pulses.     Heart sounds: Normal heart sounds. No murmur heard.   No gallop.  Pulmonary:     Effort: Pulmonary effort is normal. No respiratory distress.     Breath sounds: Normal breath sounds. No wheezing.  Abdominal:     General: Abdomen is flat. Bowel sounds are normal. There is no distension.     Palpations: Abdomen is soft.     Tenderness: There is no abdominal tenderness.  Musculoskeletal:     Cervical back: Normal range of motion.  Right lower leg: No edema.     Left lower leg: No edema.  Skin:    General: Skin is warm.     Capillary Refill: Capillary refill takes less than 2 seconds.  Neurological:     General: No focal deficit present.     Mental Status: She is alert and oriented to person, place, and time. Mental status is at baseline.  Psychiatric:        Mood and Affect: Mood normal.    BP 118/70   Pulse 99   Temp (!) 97.5 F (36.4 C)   Resp 15   Ht 5' (1.524 m)   Wt 127 lb (57.6 kg)   LMP  (LMP Unknown)   SpO2 96%   BMI 24.80 kg/m  Wt Readings from Last 3 Encounters:  01/11/21 127 lb (57.6 kg)  12/21/20 130 lb (59 kg)  10/29/20 131 lb (59.4 kg)    Health Maintenance Due  Topic Date Due   TETANUS/TDAP  Never done   Zoster Vaccines- Shingrix (2 of 2) 06/21/2018   COVID-19 Vaccine (3 - Booster for Pfizer series) 07/29/2019   INFLUENZA VACCINE  10/19/2020    There are no preventive care reminders to display for this patient.   Lab Results  Component Value Date   TSH 2.410 06/04/2020   Lab Results  Component Value Date   WBC 20.9 (H) 12/22/2020   HGB 10.2 (L) 12/22/2020   HCT 30.8 (L) 12/22/2020   MCV 95.1 12/22/2020   PLT 253 12/22/2020   Lab Results  Component Value Date   NA 141 12/22/2020   K 4.4 12/22/2020   CO2 23 12/22/2020   GLUCOSE 159 (H) 12/22/2020   BUN 15 12/22/2020   CREATININE 0.70 12/22/2020   BILITOT 0.7 12/15/2020   ALKPHOS 59 12/15/2020   AST 20 12/15/2020   ALT 14 12/15/2020   PROT 6.5 12/15/2020    ALBUMIN 3.8 12/15/2020   CALCIUM 8.8 (L) 12/22/2020   ANIONGAP 5 12/22/2020   EGFR 60 06/04/2020   Lab Results  Component Value Date   CHOL 122 06/04/2020   Lab Results  Component Value Date   HDL 58 06/04/2020   Lab Results  Component Value Date   LDLCALC 47 06/04/2020   Lab Results  Component Value Date   TRIG 92 06/04/2020   Lab Results  Component Value Date   CHOLHDL 2.1 06/04/2020   No results found for: HGBA1C     Assessment & Plan:   Problem List Items Addressed This Visit   None Visit Diagnoses     Acute cystitis without hematuria    -  Primary   Relevant Medications   cefTRIAXone (ROCEPHIN) injection 1 g (Start on 01/11/2021  3:45 PM)   Other Relevant Orders   POCT URINALYSIS DIP (CLINITEK) Patient has UTI, start with rocephin IM and start cipro   Adjustment insomnia       Relevant Medications   zolpidem (AMBIEN) 5 MG tablet Patient is having trouble sleeping.  Try Lorrin Mais and zoloft 74m again      Meds ordered this encounter  Medications   cefTRIAXone (ROCEPHIN) injection 1 g   zolpidem (AMBIEN) 5 MG tablet    Sig: Take 1 tablet (5 mg total) by mouth at bedtime as needed for sleep.    Dispense:  15 tablet    Refill:  1     Orders Placed This Encounter  Procedures   POCT URINALYSIS DIP (CLINITEK)      Follow-up:  Return if symptoms worsen or fail to improve.  An After Visit Summary was printed and given to the patient.  [name], MD [facility] [phone]

## 2021-01-14 LAB — URINE CULTURE: Organism ID, Bacteria: NO GROWTH

## 2021-01-14 NOTE — Progress Notes (Signed)
Urine culture negative ?lp

## 2021-01-19 DIAGNOSIS — M25561 Pain in right knee: Secondary | ICD-10-CM | POA: Diagnosis not present

## 2021-01-19 DIAGNOSIS — M6281 Muscle weakness (generalized): Secondary | ICD-10-CM | POA: Diagnosis not present

## 2021-01-29 ENCOUNTER — Other Ambulatory Visit: Payer: Self-pay | Admitting: Legal Medicine

## 2021-02-05 ENCOUNTER — Other Ambulatory Visit: Payer: Self-pay | Admitting: Legal Medicine

## 2021-02-05 DIAGNOSIS — F5102 Adjustment insomnia: Secondary | ICD-10-CM

## 2021-02-08 ENCOUNTER — Ambulatory Visit: Payer: Medicare Other | Admitting: Legal Medicine

## 2021-02-18 ENCOUNTER — Other Ambulatory Visit: Payer: Self-pay | Admitting: Legal Medicine

## 2021-02-18 DIAGNOSIS — E039 Hypothyroidism, unspecified: Secondary | ICD-10-CM

## 2021-02-18 DIAGNOSIS — I739 Peripheral vascular disease, unspecified: Secondary | ICD-10-CM

## 2021-02-25 ENCOUNTER — Other Ambulatory Visit: Payer: Self-pay

## 2021-02-25 MED ORDER — PANTOPRAZOLE SODIUM 40 MG PO TBEC: 40.0000 mg | DELAYED_RELEASE_TABLET | Freq: Every day | ORAL | 2 refills | Status: DC

## 2021-03-01 ENCOUNTER — Telehealth: Payer: Self-pay

## 2021-03-01 NOTE — Progress Notes (Signed)
Chronic Care Management Pharmacy Assistant   Name: Amber Brown  MRN: [id number] DOB: 02/04/1929   Reason for Encounter: Disease State call for HTN    Recent office visits:  01/11/21 [name] MD. Seen for Acute Cystitis.   Recent consult visits:  01/28/21 (Sports Medicine) [name] PA-C. Seen for Post Op. No med changes. Started on Ciprofloxacin HCI 500 mg 2 times daily and Zolpidem Tartrate 5 mg at bedtime prn. Completed course of Methocarbamol [phone] mg every 6 hours prn and Oxycodone HCI 5-10 mg every 6 hours prn and Bactrim 800-160 mg.   01/06/21 (Sports Medicine) [name] PA-C. Orders Only. Ordered Zofran 4 mg every 8 hours prn.   12/31/20 (Orthopedic) No notes available   Hospital visits:  None   Medications: Outpatient Encounter Medications as of 03/01/2021  Medication Sig   alendronate (FOSAMAX) 70 MG tablet TAKE 1 TABLET BY MOUTH  WEEKLY   aspirin 81 MG EC tablet Take 81 mg by mouth daily.    atorvastatin (LIPITOR) 40 MG tablet TAKE 1 TABLET BY MOUTH  DAILY   calcium carbonate (OSCAL) 1500 (600 Ca) MG TABS tablet Take 600 mg of elemental calcium by mouth daily with breakfast.   clopidogrel (PLAVIX) 75 MG tablet TAKE 1 TABLET BY MOUTH  DAILY   dorzolamide-timolol (COSOPT) 22.3-6.8 MG/ML ophthalmic solution Place 1 drop into both eyes 2 (two) times daily.    levothyroxine (SYNTHROID) 50 MCG tablet TAKE 1 TABLET BY MOUTH  DAILY   meclizine (ANTIVERT) 25 MG tablet Take 1 tablet (25 mg total) by mouth 3 (three) times daily as needed for dizziness.   metoprolol succinate (TOPROL-XL) 25 MG 24 hr tablet TAKE 1 TABLET BY MOUTH  TWICE DAILY   Multiple Vitamins-Minerals (OCUVITE ADULT FORMULA PO) Take 1 tablet by mouth daily.   ondansetron (ZOFRAN-ODT) 4 MG disintegrating tablet Take 1 tablet (4 mg total) by mouth every 8 (eight) hours as needed.   pantoprazole (PROTONIX) 40 MG tablet Take 1 tablet (40 mg total) by mouth daily.   ROCKLATAN  0.02-0.005 % SOLN Place 1 drop into both eyes at bedtime.   sertraline (ZOLOFT) 25 MG tablet Take 1 tablet (25 mg total) by mouth daily.   SSD 1 % cream Apply topically daily as needed (abrasions).   zolpidem (AMBIEN) 5 MG tablet TAKE 1 TABLET(5 MG) BY MOUTH AT BEDTIME AS NEEDED FOR SLEEP   No facility-administered encounter medications on file as of 03/01/2021.    Recent Office Vitals: BP Readings from Last 3 Encounters:  01/11/21 118/70  12/22/20 (!) 146/64  12/15/20 (!) 146/75   Pulse Readings from Last 3 Encounters:  01/11/21 99  12/22/20 70  12/15/20 71    Wt Readings from Last 3 Encounters:  01/11/21 127 lb (57.6 kg)  12/21/20 130 lb (59 kg)  10/29/20 131 lb (59.4 kg)     Kidney Function Lab Results  Component Value Date/Time   CREATININE 0.70 12/22/2020 03:24 AM   CREATININE 0.87 12/15/2020 02:45 PM   GFRNONAA >60 12/22/2020 03:24 AM   GFRAA 60 05/28/2019 11:09 AM    BMP Latest Ref Rng & Units 12/22/2020 12/15/2020 06/04/2020  Glucose 70 - 99 mg/dL 159(H) 142(H) 125(H)  BUN 8 - 23 mg/dL 15 21 15   Creatinine 0.44 - 1.00 mg/dL 0.70 0.87 0.92  BUN/Creat Ratio 12 - 28 - - 16  Sodium 135 - 145 mmol/L 141 140 140  Potassium 3.5 - 5.1 mmol/L 4.4 4.1 4.3  Chloride 98 -  111 mmol/L 113(H) 106 102  CO2 22 - 32 mmol/L 23 26 21   Calcium 8.9 - 10.3 mg/dL 8.8(L) 9.8 9.6     Current antihypertensive regimen:  Metoprolol Succinate XL 25 mg twice daily  Patient verbally confirms she is taking the above medications as directed. Yes  Pt stated she has not been checking BP due to not being able to see and her BP monitor is not working   Current home BP readings: None   Caffeine intake: none  Salt intake:Limited  OTC medications including pseudoephedrine or NSAIDs?None   Any readings above 180/120? Pt is not aware of any high BP readings   Note: What recent interventions/DTPs have been made by any provider to improve Blood Pressure control since last CPP Visit:   Any  recent hospitalizations or ED visits since last visit with CPP?  Only for her knee replacement   What diet changes have been made to improve Blood Pressure Control?  Pt tries not to over eat and has lost some weight  What exercise is being done to improve your Blood Pressure Control?  Pt does physical therapy since her knee replacement at home  Pt wants to know what she can take for pain that she has in her back that will not mess with her other medications or give any side effects. Please advise.   Adherence Review: Is the patient currently on ACE/ARB medication? Yes Does the patient have >5 day gap between last estimated fill dates? CPP to review  Care Gaps: Last annual wellness visit: none noted  Star Rating Drugs:  Medication:  Last Fill: Day Supply None   Elray Mcgregor, [place] Pharmacist Assistant  [phone]

## 2021-03-24 ENCOUNTER — Ambulatory Visit (INDEPENDENT_AMBULATORY_CARE_PROVIDER_SITE_OTHER): Payer: Medicare Other | Admitting: Legal Medicine

## 2021-03-24 ENCOUNTER — Encounter: Payer: Self-pay | Admitting: Legal Medicine

## 2021-03-24 ENCOUNTER — Telehealth: Payer: Self-pay | Admitting: Legal Medicine

## 2021-03-24 VITALS — BP 132/74 | HR 75 | Temp 97.7°F | Resp 18 | Ht 60.0 in | Wt 127.0 lb

## 2021-03-24 DIAGNOSIS — R55 Syncope and collapse: Secondary | ICD-10-CM | POA: Insufficient documentation

## 2021-03-24 DIAGNOSIS — Z89421 Acquired absence of other right toe(s): Secondary | ICD-10-CM

## 2021-03-24 DIAGNOSIS — H401133 Primary open-angle glaucoma, bilateral, severe stage: Secondary | ICD-10-CM | POA: Diagnosis not present

## 2021-03-24 DIAGNOSIS — I10 Essential (primary) hypertension: Secondary | ICD-10-CM

## 2021-03-24 NOTE — Telephone Encounter (Signed)
° °  Amber Brown has been scheduled for the following appointment:  WHAT: MRI OF BRAIN WHERE: [facility] [place] DATE: 03/26/21 TIME: 9:30 AM ARRIVAL TIME  Patient has been made aware.

## 2021-03-24 NOTE — Progress Notes (Signed)
Established Patient Office Visit  Subjective:  Patient ID: Amber Brown, female    DOB: [birth year]  Age: 88 y.o. MRN: [id number]  CC:  Chief Complaint  Patient presents with   Fall    "Blacked out"     HPI Amber Brown presents for blackout on 12/28 or 12/29. She was ready to go bed and then she just remembered to see her niece helped her to get up. Patient denied any palpations,dizziness, chest pain. Patient also mentioned that 08/22/2020 she was walking backward and she fell down and this was for 3 minutes. She has pressure headache after this episode.  Patient has PAD with toe amputation  Past Medical History:  Diagnosis Date   Arthritis    Degenerative scoliosis in adult patient 01/06/2015   GERD with esophagitis 05/28/2019   Glaucoma 02/20/2017   History of kidney stones    Hypertension 02/20/2017   Hypothyroidism, adult 05/28/2019   Mixed hyperlipidemia 05/28/2019   Osteoporosis of forearm without pathological fracture 05/28/2019   Peripheral vascular disease ([place]) 05/28/2019   Sequelae of cerebral infarction 05/28/2019   Stroke [facility])    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    Past Surgical History:  Procedure Laterality Date   ABDOMINAL HYSTERECTOMY     CHOLECYSTECTOMY     TOTAL KNEE ARTHROPLASTY Right 12/21/2020   Procedure: TOTAL KNEE ARTHROPLASTY;  Surgeon: Vickey Huger, MD;  Location: [facility];  Service: Orthopedics;  Laterality: Right;    Family History  Problem Relation Age of Onset   Aneurysm Mother    Bone cancer Father    Heart Problems Brother    Alcohol abuse Brother    Lung cancer Brother     Social History   Socioeconomic History   Marital status: Widowed    Spouse name: Not on file   Number of children: Not on file   Years of education: Not on file   Highest education level: Not on file  Occupational History   Not on file  Tobacco Use   Smoking status: Never   Smokeless tobacco: Never  Vaping Use   Vaping Use: Never used  Substance and  Sexual Activity   Alcohol use: Never   Drug use: Never   Sexual activity: Not Currently  Other Topics Concern   Not on file  Social History Narrative   Not on file   Social Determinants of Health   Financial Resource Strain: Not on file  Food Insecurity: Not on file  Transportation Needs: Not on file  Physical Activity: Not on file  Stress: Not on file  Social Connections: Not on file  Intimate Partner Violence: Not on file    Outpatient Medications Prior to Visit  Medication Sig Dispense Refill   alendronate (FOSAMAX) 70 MG tablet TAKE 1 TABLET BY MOUTH  WEEKLY 12 tablet 3   aspirin 81 MG EC tablet Take 81 mg by mouth daily.      atorvastatin (LIPITOR) 40 MG tablet TAKE 1 TABLET BY MOUTH  DAILY 90 tablet 3   calcium carbonate (OSCAL) 1500 (600 Ca) MG TABS tablet Take 600 mg of elemental calcium by mouth daily with breakfast.     clopidogrel (PLAVIX) 75 MG tablet TAKE 1 TABLET BY MOUTH  DAILY 90 tablet 3   dorzolamide-timolol (COSOPT) 22.3-6.8 MG/ML ophthalmic solution Place 1 drop into both eyes 2 (two) times daily.      levothyroxine (SYNTHROID) 50 MCG tablet TAKE 1 TABLET BY MOUTH  DAILY 90 tablet 3  meclizine (ANTIVERT) 25 MG tablet Take 1 tablet (25 mg total) by mouth 3 (three) times daily as needed for dizziness. 30 tablet 0   metoprolol succinate (TOPROL-XL) 25 MG 24 hr tablet TAKE 1 TABLET BY MOUTH  TWICE DAILY 180 tablet 3   Multiple Vitamins-Minerals (OCUVITE ADULT FORMULA PO) Take 1 tablet by mouth daily.     ondansetron (ZOFRAN-ODT) 4 MG disintegrating tablet Take 1 tablet (4 mg total) by mouth every 8 (eight) hours as needed. 20 tablet 2   pantoprazole (PROTONIX) 40 MG tablet Take 1 tablet (40 mg total) by mouth daily. 90 tablet 2   ROCKLATAN 0.02-0.005 % SOLN Place 1 drop into both eyes at bedtime.     sertraline (ZOLOFT) 25 MG tablet Take 1 tablet (25 mg total) by mouth daily. 30 tablet 3   SSD 1 % cream Apply topically daily as needed (abrasions). 50 g 1    zolpidem (AMBIEN) 5 MG tablet TAKE 1 TABLET(5 MG) BY MOUTH AT BEDTIME AS NEEDED FOR SLEEP 30 tablet 2   No facility-administered medications prior to visit.    Allergies  Allergen Reactions   Meperidine Nausea Only    ROS Review of Systems  Constitutional:  Negative for chills, fatigue and fever.  HENT:  Negative for congestion, ear pain and sore throat.   Respiratory:  Negative for cough and shortness of breath.   Cardiovascular:  Negative for chest pain and palpitations.  Gastrointestinal:  Negative for abdominal pain, constipation, diarrhea, nausea and vomiting.  Endocrine: Negative for polydipsia, polyphagia and polyuria.  Genitourinary:  Negative for difficulty urinating and dysuria.  Musculoskeletal:  Negative for arthralgias, back pain and myalgias.  Skin:  Positive for wound (laceration on left forearm). Negative for rash.  Neurological:  Negative for headaches.  Psychiatric/Behavioral:  Negative for dysphoric mood. The patient is not nervous/anxious.      Objective:    Physical Exam Constitutional:      Appearance: Normal appearance. She is normal weight.  HENT:     Head: Normocephalic.     Right Ear: Tympanic membrane normal.     Left Ear: Tympanic membrane normal.     Nose: Nose normal.  Eyes:     Conjunctiva/sclera: Conjunctivae normal.     Pupils: Pupils are equal, round, and reactive to light.  Cardiovascular:     Rate and Rhythm: Normal rate and regular rhythm.     Pulses: Normal pulses.     Heart sounds: Normal heart sounds. No murmur heard. Pulmonary:     Effort: Pulmonary effort is normal. No respiratory distress.     Breath sounds: Normal breath sounds. No wheezing.  Abdominal:     General: Bowel sounds are normal. There is no distension.     Palpations: Abdomen is soft.     Tenderness: There is no abdominal tenderness.  Skin:    Capillary Refill: Capillary refill takes less than 2 seconds.     Findings: Lesion (laceration heal on left forearm)  present.  Neurological:     General: No focal deficit present.     Mental Status: She is oriented to person, place, and time.     Motor: Weakness (generalizd) present.     Gait: Gait normal.     Comments: Romberg  positive falls  back and to the right. No arm drift, no focal weakness, CN II to XII intact.  No VF abnormalities Coordination and speech normal    BP 132/74    Pulse 75    Temp  97.7 F (36.5 C)    Resp 18    Ht 5' (1.524 m)    Wt 127 lb (57.6 kg)    LMP  (LMP Unknown)    SpO2 95%    BMI 24.80 kg/m  Wt Readings from Last 3 Encounters:  03/24/21 127 lb (57.6 kg)  01/11/21 127 lb (57.6 kg)  12/21/20 130 lb (59 kg)  EKG NSR rate 73, PR 124mec, QRS 122 msec, QTC 469, axis -36, RBBB, LAD   Health Maintenance Due  Topic Date Due   TETANUS/TDAP  Never done   Zoster Vaccines- Shingrix (2 of 2) 06/21/2018   COVID-19 Vaccine (3 - Booster for Pfizer series) 07/29/2019   INFLUENZA VACCINE  10/19/2020    There are no preventive care reminders to display for this patient.  Lab Results  Component Value Date   TSH 2.410 06/04/2020   Lab Results  Component Value Date   WBC 20.9 (H) 12/22/2020   HGB 10.2 (L) 12/22/2020   HCT 30.8 (L) 12/22/2020   MCV 95.1 12/22/2020   PLT 253 12/22/2020   Lab Results  Component Value Date   NA 141 12/22/2020   K 4.4 12/22/2020   CO2 23 12/22/2020   GLUCOSE 159 (H) 12/22/2020   BUN 15 12/22/2020   CREATININE 0.70 12/22/2020   BILITOT 0.7 12/15/2020   ALKPHOS 59 12/15/2020   AST 20 12/15/2020   ALT 14 12/15/2020   PROT 6.5 12/15/2020   ALBUMIN 3.8 12/15/2020   CALCIUM 8.8 (L) 12/22/2020   ANIONGAP 5 12/22/2020   EGFR 60 06/04/2020   Lab Results  Component Value Date   CHOL 122 06/04/2020   Lab Results  Component Value Date   HDL 58 06/04/2020   Lab Results  Component Value Date   LDLCALC 47 06/04/2020   Lab Results  Component Value Date   TRIG 92 06/04/2020   Lab Results  Component Value Date   CHOLHDL 2.1  06/04/2020   No results found for: HGBA1C    Assessment & Plan:   Diagnoses and all orders for this visit: [place] of [place] of [place] right [place], vascular reasons  Primary hypertension -     CBC with Differential/Platelet -     Comprehensive metabolic panel An individual hypertension care plan was established and reinforced today.  The patient's status was assessed using clinical findings on exam and labs or diagnostic tests. The patient's success at meeting treatment goals on disease specific evidence-based guidelines and found to be well controlled. SELF MANAGEMENT: The patient and I together assessed ways to personally work towards obtaining the recommended goals. RECOMMENDATIONS: avoid decongestants found in common cold remedies, decrease consumption of alcohol, perform routine monitoring of BP with home BP cuff, exercise, reduction of dietary salt, take medicines as prescribed, try not to miss doses and quit smoking.  Regular exercise and maintaining a healthy weight is needed.  Stress reduction may help. A CLINICAL SUMMARY including written plan identify barriers to care unique to individual due to social or financial issues.  We attempt to mutually creat solutions for individual and family understanding.   Syncope, unspecified syncope type -     MR Brain Wo Contrast; Future -     Ambulatory referral to Cardiology Syncope, needs neuro and cadiac workup   30 minute visit with review of records  Follow-up: Return in about 2 weeks (around 04/07/2021) for syncope.    LReinaldo Meeker MD

## 2021-03-25 LAB — CBC WITH DIFFERENTIAL/PLATELET
Basophils Absolute: 0 10*3/uL (ref 0.0–0.2)
Basos: 0 %
EOS (ABSOLUTE): 0.1 10*3/uL (ref 0.0–0.4)
Eos: 1 %
Hematocrit: 38.4 % (ref 34.0–46.6)
Hemoglobin: 12.5 g/dL (ref 11.1–15.9)
Immature Grans (Abs): 0 10*3/uL (ref 0.0–0.1)
Immature Granulocytes: 0 %
Lymphocytes Absolute: 2 10*3/uL (ref 0.7–3.1)
Lymphs: 21 %
MCH: 28.6 pg (ref 26.6–33.0)
MCHC: 32.6 g/dL (ref 31.5–35.7)
MCV: 88 fL (ref 79–97)
Monocytes Absolute: 0.6 10*3/uL (ref 0.1–0.9)
Monocytes: 6 %
Neutrophils Absolute: 6.8 10*3/uL (ref 1.4–7.0)
Neutrophils: 72 %
Platelets: 300 10*3/uL (ref 150–450)
RBC: 4.37 x10E6/uL (ref 3.77–5.28)
RDW: 13.4 % (ref 11.7–15.4)
WBC: 9.6 10*3/uL (ref 3.4–10.8)

## 2021-03-25 LAB — COMPREHENSIVE METABOLIC PANEL
ALT: 12 IU/L (ref 0–32)
AST: 20 IU/L (ref 0–40)
Albumin/Globulin Ratio: 2.1 (ref 1.2–2.2)
Albumin: 4.1 g/dL (ref 3.5–4.6)
Alkaline Phosphatase: 85 IU/L (ref 44–121)
BUN/Creatinine Ratio: 13 (ref 12–28)
BUN: 12 mg/dL (ref 10–36)
Bilirubin Total: 0.5 mg/dL (ref 0.0–1.2)
CO2: 23 mmol/L (ref 20–29)
Calcium: 9.4 mg/dL (ref 8.7–10.3)
Chloride: 102 mmol/L (ref 96–106)
Creatinine, Ser: 0.89 mg/dL (ref 0.57–1.00)
Globulin, Total: 2 g/dL (ref 1.5–4.5)
Glucose: 169 mg/dL — ABNORMAL HIGH (ref 70–99)
Potassium: 4 mmol/L (ref 3.5–5.2)
Sodium: 136 mmol/L (ref 134–144)
Total Protein: 6.1 g/dL (ref 6.0–8.5)
eGFR: 62 mL/min/{1.73_m2} (ref >59)

## 2021-03-25 NOTE — Addendum Note (Signed)
Addended by: Thompson Caul I on: 03/25/2021 05:03 PM   Modules accepted: Orders

## 2021-03-26 DIAGNOSIS — R55 Syncope and collapse: Secondary | ICD-10-CM | POA: Diagnosis not present

## 2021-03-26 DIAGNOSIS — G319 Degenerative disease of nervous system, unspecified: Secondary | ICD-10-CM | POA: Diagnosis not present

## 2021-03-30 NOTE — Telephone Encounter (Signed)
Error

## 2021-03-31 ENCOUNTER — Other Ambulatory Visit: Payer: Self-pay

## 2021-03-31 DIAGNOSIS — J0100 Acute maxillary sinusitis, unspecified: Secondary | ICD-10-CM

## 2021-03-31 DIAGNOSIS — R42 Dizziness and giddiness: Secondary | ICD-10-CM

## 2021-03-31 DIAGNOSIS — R55 Syncope and collapse: Secondary | ICD-10-CM

## 2021-03-31 MED ORDER — AMOXICILLIN 875 MG PO TABS: 875.0000 mg | ORAL_TABLET | Freq: Two times a day (BID) | ORAL | 0 refills | Status: AC

## 2021-04-15 ENCOUNTER — Other Ambulatory Visit: Payer: Self-pay

## 2021-04-15 ENCOUNTER — Encounter: Payer: Self-pay | Admitting: Nurse Practitioner

## 2021-04-15 ENCOUNTER — Ambulatory Visit (INDEPENDENT_AMBULATORY_CARE_PROVIDER_SITE_OTHER): Payer: Medicare Other | Admitting: Nurse Practitioner

## 2021-04-15 VITALS — BP 118/72 | HR 67 | Temp 96.9°F | Ht 60.0 in | Wt 123.0 lb

## 2021-04-15 DIAGNOSIS — A084 Viral intestinal infection, unspecified: Secondary | ICD-10-CM

## 2021-04-15 NOTE — Patient Instructions (Signed)
Rest and push fluids  Small frequent meals Take probiotics and eat yogurt Follow-up if symptoms fail to improve or worsen   Viral Gastroenteritis, Adult Viral gastroenteritis is also known as the stomach flu. This condition may affect your stomach, your small intestine, and your large intestine. It can cause sudden watery poop (diarrhea), fever, and throwing up (vomiting). This condition is caused by certain germs (viruses). These germs can be passed from person to person very easily (are contagious). Having watery poop and throwing up can make you feel weak and cause you to not have enough water in your body (get dehydrated). This can make you tired and thirsty, make you have a dry mouth, and make it so you pee (urinate) less often. It is important to replace the fluids that you lose from having watery poop and throwing up. What are the causes? You can get sick by catching viruses from other people. You can also get sick by: Eating food, drinking water, or touching a surface that has the viruses on it (is contaminated). Sharing utensils or other personal items with a person who is sick. What increases the risk? Having a weak body defense system (immune system). Living with one or more children who are younger than 89 years old. Living in a nursing home. Going on cruise ships. What are the signs or symptoms? Symptoms of this condition start suddenly. Symptoms may last for a few days or for as long as a week. Common symptoms include: Watery poop. Throwing up. Other symptoms include: Fever. Headache. Feeling tired (fatigue). Pain in the belly (abdomen). Chills. Feeling weak. Feeling sick to your stomach (nauseous). Muscle aches. Not feeling hungry. How is this treated? This condition typically goes away on its own. The focus of treatment is to replace the fluids that you lose. This condition may be treated with: An ORS (oral rehydration solution). This is a drink that is sold at  pharmacies and stores. Medicines to help with your symptoms. Probiotic supplements to reduce symptoms of diarrhea. Fluids given through an IV tube, if needed. Older adults and people with other diseases or a weak body defense system are at higher risk for not having enough water in the body. Follow these instructions at home: Eating and drinking  Take an ORS as told by your doctor. Drink clear fluids in small amounts as you are able. Clear fluids include: Water. Ice chips. Fruit juice with water added to it (diluted). Low-calorie sports drinks. Drink enough fluid to keep your pee (urine) pale yellow. Eat small amounts of healthy foods every 3-4 hours as you are able. This may include whole grains, fruits, vegetables, lean meats, and yogurt. Avoid fluids that have a lot of sugar or caffeine in them, such as energy drinks, sports drinks, and soda. Avoid spicy or fatty foods. Avoid alcohol. General instructions  Wash your hands often. This is very important after you have watery poop or you throw up. If you cannot use soap and water, use hand sanitizer. Make sure that all people in your home wash their hands well and often. Take over-the-counter and prescription medicines only as told by your doctor. Rest at home while you get better. Watch your condition for any changes. Take a warm bath to help with any burning or pain from having watery poop. Keep all follow-up visits as told by your doctor. This is important. Contact a doctor if: You cannot keep fluids down. Your symptoms get worse. You have new symptoms. You feel light-headed. You  feel dizzy. You have muscle cramps. Get help right away if: You have chest pain. You feel very weak. You pass out (faint). You see blood in your throw-up. Your throw-up looks like coffee grounds. You have bloody or black poop (stools) or poop that looks like tar. You have a very bad headache, or a stiff neck, or both. You have a rash. You have  very bad pain, cramping, or bloating in your belly. You have trouble breathing. You are breathing very quickly. You have a fast heartbeat. Your skin feels cold and clammy. You feel mixed up (confused). You have pain when you pee. You have signs of not having enough water in the body, such as: Dark pee, hardly any pee, or no pee. Cracked lips. Dry mouth. Sunken eyes. Feeling very sleepy. Feeling weak. Summary Viral gastroenteritis is also known as the stomach flu. This condition can cause sudden watery poop (diarrhea), fever, and throwing up (vomiting). These germs can be passed from person to person very easily. Take an ORS as told by your doctor. This is a drink that is sold at pharmacies and stores. Drink fluids in small amounts many times each day as you are able. This information is not intended to replace advice given to you by your health care provider. Make sure you discuss any questions you have with your health care provider. Document Revised: 01/10/2018 Document Reviewed: 01/10/2018 Elsevier Patient Education  2022 [employer].

## 2021-04-15 NOTE — Progress Notes (Signed)
Acute Office Visit  Subjective:    Patient ID: Amber Brown, female    DOB: 1928/04/08, 88 y.o.   MRN: [id number]  CHIEF COMPLAINT: Abdominal pain  HPI: Amber Brown is a 88 year old Caucasian female that presents for evaluation of abd pain, nausea, vomiting, and diarrhea. She is accompanied by her adult niece, Judie Petit. Onset of symptoms was 10 days ago. Treatment has included Pepto-Bismol. States she has had two episodes of vomiting and diarrhea.She recently completed a course of Amoxicillin for a recent sinus infection. She also stopped taking Ambien due to some impaired memory episodes. She is prescribed Protonix for GERD. Denies decreased appetite, oral intake, fever, or weight loss.  Patient is in today for Abdominal Pain  She reports recurrent abdominal pain. The most recent episode started  4 days ago and is resolved. The abdominal pain is located in the epigastrium. It is described as aching, is moderate in intensity, occurring intermittently. It is aggravated by eating    and is relieved by  vomiting .   Associated symptoms: No anorexia  No belching  No bloody stool No blood in urine   No constipation Yes diarrhea  No dysuria No fever  Yes flatus Yes headaches  Yes headaches No joint pains  No myalgias Yes nausea  Yes vomiting No weight loss     Recent GI studies: none Relevant medical history includes: cholecystectomy  Previous labs Lab Results  Component Value Date   WBC 9.6 03/24/2021   HGB 12.5 03/24/2021   HCT 38.4 03/24/2021   MCV 88 03/24/2021   MCH 28.6 03/24/2021   RDW 13.4 03/24/2021   PLT 300 03/24/2021   Lab Results  Component Value Date   GLUCOSE 169 (H) 03/24/2021   NA 136 03/24/2021   K 4.0 03/24/2021   CL 102 03/24/2021   CO2 23 03/24/2021   BUN 12 03/24/2021   CREATININE 0.89 03/24/2021   GFRNONAA >60 12/22/2020   GFRAA 60 05/28/2019   CALCIUM 9.4 03/24/2021   PROT 6.1 03/24/2021   ALBUMIN 4.1 03/24/2021   LABGLOB 2.0 03/24/2021    AGRATIO 2.1 03/24/2021   BILITOT 0.5 03/24/2021   ALKPHOS 85 03/24/2021   AST 20 03/24/2021   ALT 12 03/24/2021   ANIONGAP 5 12/22/2020     Past Medical History:  Diagnosis Date   Arthritis    Degenerative scoliosis in adult patient 01/06/2015   GERD with esophagitis 05/28/2019   Glaucoma 02/20/2017   History of kidney stones    Hypertension 02/20/2017   Hypothyroidism, adult 05/28/2019   Mixed hyperlipidemia 05/28/2019   Osteoporosis of forearm without pathological fracture 05/28/2019   Peripheral vascular disease ([place]) 05/28/2019   Sequelae of cerebral infarction 05/28/2019   Stroke [facility])    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    Past Surgical History:  Procedure Laterality Date   ABDOMINAL HYSTERECTOMY     CHOLECYSTECTOMY     TOTAL KNEE ARTHROPLASTY Right 12/21/2020   Procedure: TOTAL KNEE ARTHROPLASTY;  Surgeon: Vickey Huger, MD;  Location: [facility];  Service: Orthopedics;  Laterality: Right;    Family History  Problem Relation Age of Onset   Aneurysm Mother    Bone cancer Father    Heart Problems Brother    Alcohol abuse Brother    Lung cancer Brother     Social History   Socioeconomic History   Marital status: Widowed    Spouse name: Not on file   Number of children: Not on file  Years of education: Not on file   Highest education level: Not on file  Occupational History   Not on file  Tobacco Use   Smoking status: Never   Smokeless tobacco: Never  Vaping Use   Vaping Use: Never used  Substance and Sexual Activity   Alcohol use: Never   Drug use: Never   Sexual activity: Not Currently  Other Topics Concern   Not on file  Social History Narrative   Not on file   Social Determinants of Health   Financial Resource Strain: Not on file  Food Insecurity: Not on file  Transportation Needs: Not on file  Physical Activity: Not on file  Stress: Not on file  Social Connections: Not on file  Intimate Partner Violence: Not on file    Outpatient  Medications Prior to Visit  Medication Sig Dispense Refill   alendronate (FOSAMAX) 70 MG tablet TAKE 1 TABLET BY MOUTH  WEEKLY 12 tablet 3   aspirin 81 MG EC tablet Take 81 mg by mouth daily.      atorvastatin (LIPITOR) 40 MG tablet TAKE 1 TABLET BY MOUTH  DAILY 90 tablet 3   calcium carbonate (OSCAL) 1500 (600 Ca) MG TABS tablet Take 600 mg of elemental calcium by mouth daily with breakfast.     clopidogrel (PLAVIX) 75 MG tablet TAKE 1 TABLET BY MOUTH  DAILY 90 tablet 3   dorzolamide-timolol (COSOPT) 22.3-6.8 MG/ML ophthalmic solution Place 1 drop into both eyes 2 (two) times daily.      levothyroxine (SYNTHROID) 50 MCG tablet TAKE 1 TABLET BY MOUTH  DAILY 90 tablet 3   meclizine (ANTIVERT) 25 MG tablet Take 1 tablet (25 mg total) by mouth 3 (three) times daily as needed for dizziness. 30 tablet 0   metoprolol succinate (TOPROL-XL) 25 MG 24 hr tablet TAKE 1 TABLET BY MOUTH  TWICE DAILY 180 tablet 3   Multiple Vitamins-Minerals (OCUVITE ADULT FORMULA PO) Take 1 tablet by mouth daily.     ondansetron (ZOFRAN-ODT) 4 MG disintegrating tablet Take 1 tablet (4 mg total) by mouth every 8 (eight) hours as needed. 20 tablet 2   pantoprazole (PROTONIX) 40 MG tablet Take 1 tablet (40 mg total) by mouth daily. 90 tablet 2   ROCKLATAN 0.02-0.005 % SOLN Place 1 drop into both eyes at bedtime.     sertraline (ZOLOFT) 25 MG tablet Take 1 tablet (25 mg total) by mouth daily. 30 tablet 3   SSD 1 % cream Apply topically daily as needed (abrasions). 50 g 1   zolpidem (AMBIEN) 5 MG tablet TAKE 1 TABLET(5 MG) BY MOUTH AT BEDTIME AS NEEDED FOR SLEEP 30 tablet 2   No facility-administered medications prior to visit.    Allergies  Allergen Reactions   Meperidine Nausea Only    Review of Systems  Constitutional:  Negative for appetite change, chills, fatigue, fever and unexpected weight change.  HENT:  Negative for congestion, ear pain, postnasal drip, rhinorrhea, sinus pressure, sinus pain and sore throat.    Respiratory:  Negative for cough and shortness of breath.   Cardiovascular:  Negative for chest pain.  Gastrointestinal:  Positive for abdominal pain, constipation, diarrhea, nausea and vomiting.  Musculoskeletal:  Positive for back pain (chronic).  Psychiatric/Behavioral:  Positive for confusion (impaired memory episodes).       Objective:    Physical Exam Vitals reviewed.  Constitutional:      Appearance: Normal appearance.  HENT:     Head: Normocephalic.     Right Ear:  Tympanic membrane normal.     Left Ear: Tympanic membrane normal.     Nose: Nose normal.     Mouth/Throat:     Mouth: Mucous membranes are moist.  Eyes:     Pupils: Pupils are equal, round, and reactive to light.  Cardiovascular:     Rate and Rhythm: Normal rate and regular rhythm.     Pulses: Normal pulses.     Heart sounds: Normal heart sounds.  Pulmonary:     Effort: Pulmonary effort is normal.     Breath sounds: Normal breath sounds.  Abdominal:     General: Bowel sounds are normal.     Palpations: Abdomen is soft.     Tenderness: There is abdominal tenderness in the epigastric area. There is no right CVA tenderness or left CVA tenderness.     Hernia: No hernia is present.  Musculoskeletal:        General: Normal range of motion.     Cervical back: Neck supple.  Skin:    General: Skin is warm and dry.     Capillary Refill: Capillary refill takes less than 2 seconds.  Neurological:     General: No focal deficit present.     Mental Status: She is alert and oriented to person, place, and time.  Psychiatric:        Mood and Affect: Mood normal.        Behavior: Behavior normal.  BP 118/72    Pulse 67    Temp (!) 96.9 F (36.1 C)    Ht 5' (1.524 m)    Wt 123 lb (55.8 kg)    LMP  (LMP Unknown)    SpO2 98%    BMI 24.02 kg/m   Ht 5' (1.524 m)    Wt 123 lb (55.8 kg)    LMP  (LMP Unknown)    BMI 24.02 kg/m  Wt Readings from Last 3 Encounters:  04/15/21 123 lb (55.8 kg)  03/24/21 127 lb (57.6 kg)   01/11/21 127 lb (57.6 kg)    Health Maintenance Due  Topic Date Due   TETANUS/TDAP  Never done   Zoster Vaccines- Shingrix (2 of 2) 06/21/2018   COVID-19 Vaccine (3 - Booster for Pfizer series) 07/29/2019   INFLUENZA VACCINE  10/19/2020       Lab Results  Component Value Date   TSH 2.410 06/04/2020   Lab Results  Component Value Date   WBC 9.6 03/24/2021   HGB 12.5 03/24/2021   HCT 38.4 03/24/2021   MCV 88 03/24/2021   PLT 300 03/24/2021   Lab Results  Component Value Date   NA 136 03/24/2021   K 4.0 03/24/2021   CO2 23 03/24/2021   GLUCOSE 169 (H) 03/24/2021   BUN 12 03/24/2021   CREATININE 0.89 03/24/2021   BILITOT 0.5 03/24/2021   ALKPHOS 85 03/24/2021   AST 20 03/24/2021   ALT 12 03/24/2021   PROT 6.1 03/24/2021   ALBUMIN 4.1 03/24/2021   CALCIUM 9.4 03/24/2021   ANIONGAP 5 12/22/2020   EGFR 62 03/24/2021   Lab Results  Component Value Date   CHOL 122 06/04/2020   Lab Results  Component Value Date   HDL 58 06/04/2020   Lab Results  Component Value Date   LDLCALC 47 06/04/2020   Lab Results  Component Value Date   TRIG 92 06/04/2020   Lab Results  Component Value Date   CHOLHDL 2.1 06/04/2020        Assessment & Plan:  1. Viral gastroenteritis  -push fluids -small frequent meals -avoid fried, greasy foods -take probiotics daily, eat yogurt as tolerated   Rest and push fluids  Small frequent meals Take probiotics and eat yogurt Follow-up if symptoms fail to improve or worsen   Follow-up: PRN  An After Visit Summary was printed and given to the patient.  I, Rip Harbour, NP, have reviewed all documentation for this visit. The documentation on 04/15/21 for the exam, diagnosis, procedures, and orders are all accurate and complete.     Signed, Rip Harbour, NP [place] [phone]

## 2021-04-23 DIAGNOSIS — I639 Cerebral infarction, unspecified: Secondary | ICD-10-CM | POA: Insufficient documentation

## 2021-04-23 DIAGNOSIS — Z87442 Personal history of urinary calculi: Secondary | ICD-10-CM | POA: Insufficient documentation

## 2021-04-23 DIAGNOSIS — M1990 Unspecified osteoarthritis, unspecified site: Secondary | ICD-10-CM | POA: Insufficient documentation

## 2021-04-27 ENCOUNTER — Ambulatory Visit: Payer: Medicare Other | Admitting: Cardiology

## 2021-04-27 ENCOUNTER — Ambulatory Visit (INDEPENDENT_AMBULATORY_CARE_PROVIDER_SITE_OTHER): Payer: Medicare Other

## 2021-04-27 ENCOUNTER — Encounter: Payer: Self-pay | Admitting: Cardiology

## 2021-04-27 ENCOUNTER — Other Ambulatory Visit: Payer: Self-pay

## 2021-04-27 VITALS — BP 140/82 | HR 70 | Ht 60.0 in | Wt 123.4 lb

## 2021-04-27 DIAGNOSIS — I10 Essential (primary) hypertension: Secondary | ICD-10-CM | POA: Diagnosis not present

## 2021-04-27 DIAGNOSIS — R55 Syncope and collapse: Secondary | ICD-10-CM

## 2021-04-27 DIAGNOSIS — E782 Mixed hyperlipidemia: Secondary | ICD-10-CM

## 2021-04-27 DIAGNOSIS — I639 Cerebral infarction, unspecified: Secondary | ICD-10-CM | POA: Diagnosis not present

## 2021-04-27 MED ORDER — METOPROLOL SUCCINATE ER 25 MG PO TB24: 25.0000 mg | ORAL_TABLET | Freq: Every day | ORAL | 3 refills | Status: DC

## 2021-04-27 NOTE — Progress Notes (Signed)
Cardiology Office Note:    Date:  04/27/2021   ID:  ERMEL VERNE, DOB 1928-09-30, MRN [id number]  PCP:  Lillard Anes, MD  Cardiologist:  [name], MD   Referring MD: Lillard Anes,*  ASSESSMENT:    1. Syncope, unspecified syncope type   2. Cerebrovascular accident (CVA), unspecified mechanism ([place])   3. Primary hypertension   4. Mixed hyperlipidemia    PLAN:    In order of problems listed above:  Her episode is quite unusual especially lack recall the next day and is not typical fainting.  On one hand she had trauma and no premonition raising concern for problems like sinus arrest on the other hand the episode apparently lasted for several minutes and she has an abnormal CT of the head but no obvious seizure activity it is certainly conceivable that this was related to Ambien regardless I would have her take hypnotics again and I would reduce her beta-blocker dose by 50% she has a general principal with age small stature and frailty.  We discussed the risk of syncope falls injury and I think there is value in applying a 1 week monitor to screen her for sinus node dysfunction or intermittent heart block in the absence and that I do not think she needs any further evaluation and if she had repeated episodes implanted loop recorder could be helpful.  Provided the monitor reassuring I will see her back as needed.  Her niece was present and participated in the evaluation decision making Stable old stroke no obvious seizure activity continue current medical treatment including her dual antiplatelet therapy and lipid-lowering Stable goal blood pressure age 60 is less than 540 systolic and in general (reduce her beta-blocker dose by 50% Continue lipid-lowering treatment with history of stroke, sometimes in her age group I will transition to a low intensity statin like pravastatin with increased risk of muscle toxicity  Next appointment as needed awaiting results of her  monitor   Medication Adjustments/Labs and Tests Ordered: Current medicines are reviewed at length with the patient today.  Concerns regarding medicines are outlined above.  No orders of the defined types were placed in this encounter.  No orders of the defined types were placed in this encounter.    Chief Complaint  Patient presents with   Loss of Consciousness  I was taking Ambien at the time for about 5 weeks and I think that was the problem  History of Present Illness:    Amber Brown is a 88 y.o. female who is being seen today for the evaluation of syncope at the request of Lillard Anes,*. She was seen by her PCP 03/25/2011 was referred to neurology and cardiology and an MRI of the brain was ordered.  MRI performed [facility] 09/18/2004 2023 showed known moderate right PCA chronic stroke. Review shows no previous cardiology evaluation or diagnostic imaging performed EKG performed the day of her office visit independently reviewed sinus rhythm right bundle branch block left axis deviation.  She recognizes me of taking care of her husband years ago She had an episode where she.  To go to bed when in the bathroom her family heard a noise and found her on the floor she was down for a few minutes she bruised her hip and cut her forearm.  There is no head trauma and no evidence of ictal activity or mouth biting. She went to bed and the next day had no recall of events.  Her family  is worried that Ambien is the cause may stop the medication She has had no previous episodes and has had none since She has occasional palpitation not severe or sustained no chest pain edema shortness of breath She has problems with visual loss depends on her family takes care of her and is becoming increasingly frail. She has had no other falls. She is taking a beta-blocker for hypertension Family does not check heart rate or blood pressure at home. Past Medical History:  Diagnosis Date    Arthritis    Degenerative scoliosis in adult patient 01/06/2015   GERD with esophagitis 05/28/2019   Glaucoma 02/20/2017   History of kidney stones    Hypertension 02/20/2017   Hypothyroidism, adult 05/28/2019   Mixed hyperlipidemia 05/28/2019   Osteoporosis of forearm without pathological fracture 05/28/2019   Peripheral vascular disease ([place]) 05/28/2019   Sequelae of cerebral infarction 05/28/2019   Stroke [facility])    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    Past Surgical History:  Procedure Laterality Date   ABDOMINAL HYSTERECTOMY     CHOLECYSTECTOMY     TOTAL KNEE ARTHROPLASTY Right 12/21/2020   Procedure: TOTAL KNEE ARTHROPLASTY;  Surgeon: Vickey Huger, MD;  Location: [facility];  Service: Orthopedics;  Laterality: Right;    Current Medications: Current Meds  Medication Sig   alendronate (FOSAMAX) 70 MG tablet TAKE 1 TABLET BY MOUTH  WEEKLY   aspirin 81 MG EC tablet Take 81 mg by mouth daily.    atorvastatin (LIPITOR) 40 MG tablet TAKE 1 TABLET BY MOUTH  DAILY   calcium carbonate (OSCAL) 1500 (600 Ca) MG TABS tablet Take 600 mg of elemental calcium by mouth daily with breakfast.   clopidogrel (PLAVIX) 75 MG tablet TAKE 1 TABLET BY MOUTH  DAILY   dorzolamide-timolol (COSOPT) 22.3-6.8 MG/ML ophthalmic solution Place 1 drop into both eyes 2 (two) times daily.    levothyroxine (SYNTHROID) 50 MCG tablet TAKE 1 TABLET BY MOUTH  DAILY   metoprolol succinate (TOPROL-XL) 25 MG 24 hr tablet TAKE 1 TABLET BY MOUTH  TWICE DAILY   Multiple Vitamins-Minerals (OCUVITE ADULT FORMULA PO) Take 1 tablet by mouth daily.   pantoprazole (PROTONIX) 40 MG tablet Take 1 tablet (40 mg total) by mouth daily.   ROCKLATAN 0.02-0.005 % SOLN Place 1 drop into both eyes at bedtime.   sertraline (ZOLOFT) 25 MG tablet Take 1 tablet (25 mg total) by mouth daily.   SSD 1 % cream Apply topically daily as needed (abrasions).     Allergies:   Meperidine   Social History   Socioeconomic History   Marital status:  Widowed    Spouse name: Not on file   Number of children: Not on file   Years of education: Not on file   Highest education level: Not on file  Occupational History   Not on file  Tobacco Use   Smoking status: Never   Smokeless tobacco: Never  Vaping Use   Vaping Use: Never used  Substance and Sexual Activity   Alcohol use: Never   Drug use: Never   Sexual activity: Not Currently  Other Topics Concern   Not on file  Social History Narrative   Not on file   Social Determinants of Health   Financial Resource Strain: Not on file  Food Insecurity: Not on file  Transportation Needs: Not on file  Physical Activity: Not on file  Stress: Not on file  Social Connections: Not on file     Family History: The  patient's family history includes Alcohol abuse in her brother; Aneurysm in her mother; Bone cancer in her father; Heart Problems in her brother; Lung cancer in her brother.  ROS:   ROS Please see the history of present illness.     All other systems reviewed and are negative.  EKGs/Labs/Other Studies Reviewed:    The following studies were reviewed today:   Recent Labs: 06/04/2020: TSH 2.410 03/24/2021: ALT 12; BUN 12; Creatinine, Ser 0.89; Hemoglobin 12.5; Platelets 300; Potassium 4.0; Sodium 136  Recent Lipid Panel    Component Value Date/Time   CHOL 122 06/04/2020 0934   TRIG 92 06/04/2020 0934   HDL 58 06/04/2020 0934   CHOLHDL 2.1 06/04/2020 0934   LDLCALC 47 06/04/2020 0934    Physical Exam:    VS:  BP 140/82    Pulse 70    Ht 5' (1.524 m)    Wt 123 lb 6.4 oz (56 kg)    LMP  (LMP Unknown)    SpO2 98%    BMI 24.10 kg/m     Wt Readings from Last 3 Encounters:  04/27/21 123 lb 6.4 oz (56 kg)  04/15/21 123 lb (55.8 kg)  03/24/21 127 lb (57.6 kg)     GEN: Quite frail appearing alert  in no acute distress HEENT: Normal NECK: No JVD; No carotid bruits LYMPHATICS: No lymphadenopathy CARDIAC: No murmur RRR, no murmurs, rubs, gallops RESPIRATORY:  Clear to  auscultation without rales, wheezing or rhonchi  ABDOMEN: Soft, non-tender, non-distended MUSCULOSKELETAL:  No edema; No deformity  SKIN: Warm and dry NEUROLOGIC:  Alert and oriented x 3 PSYCHIATRIC:  Normal affect     Signed, [name], MD  04/27/2021 3:53 PM    [facility]

## 2021-04-27 NOTE — Patient Instructions (Signed)
Medication Instructions:  Your physician has recommended you make the following change in your medication:  DECREASE: Metoprolol to once daily *If you need a refill on your cardiac medications before your next appointment, please call your pharmacy*   Lab Work: None If you have labs (blood work) drawn today and your tests are completely normal, you will receive your results only by: [place] (if you have MyChart) OR A paper copy in the mail If you have any lab test that is abnormal or we need to change your treatment, we will call you to review the results.   Testing/Procedures: A zio monitor was ordered today. It will remain on for 7 days. You will then return monitor and event diary in provided box. It takes 1-2 weeks for report to be downloaded and returned to [place]. We will call you with the results. If monitor falls off or has orange flashing light, please call [place] for further instructions.     Follow-Up: At [facility], you and your health needs are our priority.  As part of our continuing mission to provide you with exceptional heart care, we have created designated Provider Care Teams.  These Care Teams include your primary Cardiologist (physician) and Advanced Practice Providers (APPs -  Physician Assistants and Nurse Practitioners) who all work together to provide you with the care you need, when you need it.  We recommend signing up for the patient portal called "MyChart".  Sign up information is provided on this After Visit Summary.  MyChart is used to connect with patients for Virtual Visits (Telemedicine).  Patients are able to view lab/test results, encounter notes, upcoming appointments, etc.  Non-urgent messages can be sent to your provider as well.   To learn more about what you can do with MyChart, go to [email or web address].    Your next appointment:   As needed  The format for your next appointment:   In Person  Provider:   [name], MD    Other  Instructions

## 2021-05-08 ENCOUNTER — Other Ambulatory Visit: Payer: Self-pay | Admitting: Legal Medicine

## 2021-05-08 DIAGNOSIS — F4321 Adjustment disorder with depressed mood: Secondary | ICD-10-CM

## 2021-05-10 ENCOUNTER — Other Ambulatory Visit: Payer: Self-pay

## 2021-05-10 ENCOUNTER — Encounter: Payer: Self-pay | Admitting: Legal Medicine

## 2021-05-10 ENCOUNTER — Ambulatory Visit (INDEPENDENT_AMBULATORY_CARE_PROVIDER_SITE_OTHER): Payer: Medicare Other | Admitting: Legal Medicine

## 2021-05-10 VITALS — BP 100/70 | HR 66 | Temp 97.6°F | Resp 15 | Ht 60.0 in | Wt 126.0 lb

## 2021-05-10 DIAGNOSIS — U071 COVID-19: Secondary | ICD-10-CM

## 2021-05-10 DIAGNOSIS — R1011 Right upper quadrant pain: Secondary | ICD-10-CM | POA: Diagnosis not present

## 2021-05-10 DIAGNOSIS — R1010 Upper abdominal pain, unspecified: Secondary | ICD-10-CM | POA: Diagnosis not present

## 2021-05-10 DIAGNOSIS — Z9189 Other specified personal risk factors, not elsewhere classified: Secondary | ICD-10-CM | POA: Diagnosis not present

## 2021-05-10 LAB — POC COVID19 BINAXNOW: SARS Coronavirus 2 Ag: POSITIVE — AB

## 2021-05-10 NOTE — Progress Notes (Signed)
Acute Office Visit  Subjective:    Patient ID: Amber Brown, female    DOB: 08/09/1928, 88 y.o.   MRN: [id number]  Chief Complaint  Patient presents with   Sinusitis   Abdominal Pain   Diarrhea    HPI: Patient is in for generalized abdominal pain and bloating and comes and goes. She also mentioned diarrhea sometimes and nauseas and vomiting after she eats.  She also has sinus pressure, sore throat, nasal congestion, chills and cough since 2 days ago. Covid positive today.  Symptoms of congestion started last Tuesday.   Since patient has active COVID at the present time, we kept the visit short to avoid further exposure of patients.  She is 6 days into the infection therefore Paxlovid will probably be of no help.   Past Medical History:  Diagnosis Date   Arthritis    Degenerative scoliosis in adult patient 01/06/2015   GERD with esophagitis 05/28/2019   Glaucoma 02/20/2017   History of kidney stones    Hypertension 02/20/2017   Hypothyroidism, adult 05/28/2019   Mixed hyperlipidemia 05/28/2019   Osteoporosis of forearm without pathological fracture 05/28/2019   Peripheral vascular disease ([facility]) 05/28/2019   Sequelae of cerebral infarction 05/28/2019   Stroke [facility])    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    Past Surgical History:  Procedure Laterality Date   ABDOMINAL HYSTERECTOMY     CHOLECYSTECTOMY     TOTAL KNEE ARTHROPLASTY Right 12/21/2020   Procedure: TOTAL KNEE ARTHROPLASTY;  Surgeon: Dannielle Huh, MD;  Location: [facility];  Service: Orthopedics;  Laterality: Right;    Family History  Problem Relation Age of Onset   Aneurysm Mother    Bone cancer Father    Heart Problems Brother    Alcohol abuse Brother    Lung cancer Brother     Social History   Socioeconomic History   Marital status: Widowed    Spouse name: Not on file   Number of children: Not on file   Years of education: Not on file   Highest education level: Not on file  Occupational History   Not on  file  Tobacco Use   Smoking status: Never   Smokeless tobacco: Never  Vaping Use   Vaping Use: Never used  Substance and Sexual Activity   Alcohol use: Never   Drug use: Never   Sexual activity: Not Currently  Other Topics Concern   Not on file  Social History Narrative   Not on file   Social Determinants of Health   Financial Resource Strain: Not on file  Food Insecurity: Not on file  Transportation Needs: Not on file  Physical Activity: Not on file  Stress: Not on file  Social Connections: Not on file  Intimate Partner Violence: Not on file    Outpatient Medications Prior to Visit  Medication Sig Dispense Refill   alendronate (FOSAMAX) 70 MG tablet TAKE 1 TABLET BY MOUTH  WEEKLY 12 tablet 3   aspirin 81 MG EC tablet Take 81 mg by mouth daily.      atorvastatin (LIPITOR) 40 MG tablet TAKE 1 TABLET BY MOUTH  DAILY 90 tablet 3   calcium carbonate (OSCAL) 1500 (600 Ca) MG TABS tablet Take 600 mg of elemental calcium by mouth daily with breakfast.     clopidogrel (PLAVIX) 75 MG tablet TAKE 1 TABLET BY MOUTH  DAILY 90 tablet 3   dorzolamide-timolol (COSOPT) 22.3-6.8 MG/ML ophthalmic solution Place 1 drop into both eyes 2 (two)  times daily.      levothyroxine (SYNTHROID) 50 MCG tablet TAKE 1 TABLET BY MOUTH  DAILY 90 tablet 3   meclizine (ANTIVERT) 25 MG tablet Take 1 tablet (25 mg total) by mouth 3 (three) times daily as needed for dizziness. 30 tablet 0   metoprolol succinate (TOPROL-XL) 25 MG 24 hr tablet Take 1 tablet (25 mg total) by mouth daily. 90 tablet 3   Multiple Vitamins-Minerals (OCUVITE ADULT FORMULA PO) Take 1 tablet by mouth daily.     ondansetron (ZOFRAN-ODT) 4 MG disintegrating tablet Take 1 tablet (4 mg total) by mouth every 8 (eight) hours as needed. 20 tablet 2   pantoprazole (PROTONIX) 40 MG tablet Take 1 tablet (40 mg total) by mouth daily. 90 tablet 2   ROCKLATAN 0.02-0.005 % SOLN Place 1 drop into both eyes at bedtime.     sertraline (ZOLOFT) 25 MG tablet  TAKE 1 TABLET(25 MG) BY MOUTH DAILY 30 tablet 3   SSD 1 % cream Apply topically daily as needed (abrasions). 50 g 1   zolpidem (AMBIEN) 5 MG tablet TAKE 1 TABLET(5 MG) BY MOUTH AT BEDTIME AS NEEDED FOR SLEEP 30 tablet 2   No facility-administered medications prior to visit.    Allergies  Allergen Reactions   Meperidine Nausea Only    Review of Systems  Constitutional:  Negative for chills, fatigue and fever.  HENT:  Positive for congestion, postnasal drip, sinus pressure, sinus pain and sore throat. Negative for ear pain.   Respiratory:  Positive for cough. Negative for shortness of breath.   Cardiovascular:  Negative for chest pain and palpitations.  Gastrointestinal:  Positive for abdominal pain, diarrhea, nausea and vomiting. Negative for constipation.  Endocrine: Negative for polydipsia, polyphagia and polyuria.  Genitourinary:  Negative for difficulty urinating and dysuria.  Musculoskeletal:  Negative for arthralgias, back pain and myalgias.  Skin: Negative.  Negative for rash.  Neurological:  Positive for headaches.  Psychiatric/Behavioral:  Negative for dysphoric mood. The patient is not nervous/anxious.       Objective:    Physical Exam Vitals reviewed.  Constitutional:      General: She is not in acute distress.    Appearance: She is well-developed. She is ill-appearing.  HENT:     Head: Normocephalic.     Mouth/Throat:     Mouth: Mucous membranes are moist.  Eyes:     Extraocular Movements: Extraocular movements intact.     Pupils: Pupils are equal, round, and reactive to light.  Cardiovascular:     Rate and Rhythm: Normal rate and regular rhythm.     Heart sounds: Normal heart sounds. No murmur heard.   No gallop.  Pulmonary:     Effort: Pulmonary effort is normal. No respiratory distress.     Breath sounds: No wheezing.  Abdominal:     General: Abdomen is flat, scaphoid and protuberant. Bowel sounds are absent. There is no distension. There are no signs of  injury.     Palpations: Abdomen is soft. There is no shifting dullness or hepatomegaly.     Tenderness: There is abdominal tenderness in the right upper quadrant.  Musculoskeletal:        General: Normal range of motion.     Right lower leg: No edema.     Left lower leg: No edema.  Skin:    Capillary Refill: Capillary refill takes less than 2 seconds.  Neurological:     Mental Status: She is alert.    BP 100/70  Pulse 66    Temp 97.6 F (36.4 C)    Resp 15    Ht 5' (1.524 m)    Wt 126 lb (57.2 kg)    LMP  (LMP Unknown)    SpO2 98%    BMI 24.61 kg/m  Wt Readings from Last 3 Encounters:  05/10/21 126 lb (57.2 kg)  04/27/21 123 lb 6.4 oz (56 kg)  04/15/21 123 lb (55.8 kg)    Health Maintenance Due  Topic Date Due   TETANUS/TDAP  Never done   Zoster Vaccines- Shingrix (2 of 2) 06/21/2018   COVID-19 Vaccine (3 - Booster for Pfizer series) 07/29/2019   INFLUENZA VACCINE  10/19/2020    There are no preventive care reminders to display for this patient.   Lab Results  Component Value Date   TSH 2.410 06/04/2020   Lab Results  Component Value Date   WBC 9.6 03/24/2021   HGB 12.5 03/24/2021   HCT 38.4 03/24/2021   MCV 88 03/24/2021   PLT 300 03/24/2021   Lab Results  Component Value Date   NA 136 03/24/2021   K 4.0 03/24/2021   CO2 23 03/24/2021   GLUCOSE 169 (H) 03/24/2021   BUN 12 03/24/2021   CREATININE 0.89 03/24/2021   BILITOT 0.5 03/24/2021   ALKPHOS 85 03/24/2021   AST 20 03/24/2021   ALT 12 03/24/2021   PROT 6.1 03/24/2021   ALBUMIN 4.1 03/24/2021   CALCIUM 9.4 03/24/2021   ANIONGAP 5 12/22/2020   EGFR 62 03/24/2021   Lab Results  Component Value Date   CHOL 122 06/04/2020   Lab Results  Component Value Date   HDL 58 06/04/2020   Lab Results  Component Value Date   LDLCALC 47 06/04/2020   Lab Results  Component Value Date   TRIG 92 06/04/2020   Lab Results  Component Value Date   CHOLHDL 2.1 06/04/2020   No results found for:  HGBA1C     Assessment & Plan:   Diagnoses and all orders for this visit: At increased risk of exposure to COVID-19 virus -     POC COVID-19- positive, wear a mask throughout full 10 days prevent further exposure of other people.  Upper abdominal pain -     Ambulatory referral to Gastroenterology Is having continued upper abdominal pain she sometimes is vomiting she is taking her pantoprazole 40 mg twice a day with only partial relief.  No diarrhea no fever or chills she has had a cholecystectomy in the past.  We will continue her present medicines but we will refer her to Dr. Lyda Jester she probably will require an EGD.         Follow-up: Return in about 3 months (around 08/07/2021) for fasting.  An After Visit Summary was printed and given to the patient.  [name], MD [facility] [phone]

## 2021-05-12 DIAGNOSIS — R55 Syncope and collapse: Secondary | ICD-10-CM | POA: Diagnosis not present

## 2021-05-17 ENCOUNTER — Telehealth: Payer: Self-pay

## 2021-05-17 ENCOUNTER — Telehealth: Payer: Self-pay | Admitting: *Deleted

## 2021-05-17 ENCOUNTER — Other Ambulatory Visit: Payer: Self-pay | Admitting: Legal Medicine

## 2021-05-17 DIAGNOSIS — R1011 Right upper quadrant pain: Secondary | ICD-10-CM

## 2021-05-17 NOTE — Telephone Encounter (Signed)
Amber Brown calling requesting pain medication for patient until seen by GI. Patient has continued to vomit and having abd pain.   [name] will reach out to Dr Misenheimer's office to see when appointment is scheduled.   [name], [place] 05/17/21 9:12 AM

## 2021-05-17 NOTE — Telephone Encounter (Signed)
[name] calling back. First available appointment is March 22nd. Misenheimer's office recommended to [name] to request [place] to order an [place] or CT.   [name], [place] 05/17/21 9:28 AM

## 2021-05-17 NOTE — Telephone Encounter (Signed)
Informed pt of normal monitor results and no change in medication. She verbalized understanding and thanked me for call.

## 2021-05-24 ENCOUNTER — Other Ambulatory Visit: Payer: Medicare Other

## 2021-05-25 ENCOUNTER — Other Ambulatory Visit: Payer: Self-pay | Admitting: Legal Medicine

## 2021-05-25 ENCOUNTER — Other Ambulatory Visit: Payer: Self-pay

## 2021-05-25 ENCOUNTER — Ambulatory Visit
Admission: RE | Admit: 2021-05-25 | Discharge: 2021-05-25 | Disposition: A | Discharge status: Home / Self Care | Payer: Medicare Other | Source: Ambulatory Visit | Attending: Legal Medicine | Admitting: Legal Medicine

## 2021-05-25 DIAGNOSIS — Z9049 Acquired absence of other specified parts of digestive tract: Secondary | ICD-10-CM | POA: Diagnosis not present

## 2021-05-25 DIAGNOSIS — R42 Dizziness and giddiness: Secondary | ICD-10-CM

## 2021-05-25 DIAGNOSIS — R1011 Right upper quadrant pain: Secondary | ICD-10-CM

## 2021-05-25 MED ORDER — LORAZEPAM 0.5 MG PO TABS: 0.5000 mg | ORAL_TABLET | Freq: Two times a day (BID) | ORAL | 1 refills | Status: DC | PRN

## 2021-05-25 NOTE — Progress Notes (Signed)
Ultrasound was all normal ?lp

## 2021-05-25 NOTE — Progress Notes (Signed)
I called in lorazepam for dizziness to try ?lp

## 2021-05-31 DIAGNOSIS — K5790 Diverticulosis of intestine, part unspecified, without perforation or abscess without bleeding: Secondary | ICD-10-CM | POA: Diagnosis not present

## 2021-05-31 DIAGNOSIS — R1013 Epigastric pain: Secondary | ICD-10-CM | POA: Diagnosis not present

## 2021-05-31 DIAGNOSIS — R634 Abnormal weight loss: Secondary | ICD-10-CM | POA: Diagnosis not present

## 2021-05-31 DIAGNOSIS — K649 Unspecified hemorrhoids: Secondary | ICD-10-CM | POA: Diagnosis not present

## 2021-05-31 DIAGNOSIS — R112 Nausea with vomiting, unspecified: Secondary | ICD-10-CM | POA: Diagnosis not present

## 2021-06-03 DIAGNOSIS — R112 Nausea with vomiting, unspecified: Secondary | ICD-10-CM | POA: Diagnosis not present

## 2021-06-03 DIAGNOSIS — R109 Unspecified abdominal pain: Secondary | ICD-10-CM | POA: Diagnosis not present

## 2021-06-11 ENCOUNTER — Telehealth: Payer: Self-pay

## 2021-06-11 NOTE — Telephone Encounter (Signed)
Error

## 2021-07-01 ENCOUNTER — Other Ambulatory Visit: Payer: Self-pay

## 2021-07-01 DIAGNOSIS — R42 Dizziness and giddiness: Secondary | ICD-10-CM

## 2021-07-01 MED ORDER — LORAZEPAM 0.5 MG PO TABS: 0.5000 mg | ORAL_TABLET | Freq: Two times a day (BID) | ORAL | 3 refills | Status: DC | PRN

## 2021-07-20 ENCOUNTER — Telehealth: Payer: Self-pay

## 2021-07-20 ENCOUNTER — Other Ambulatory Visit: Payer: Self-pay | Admitting: Legal Medicine

## 2021-07-20 DIAGNOSIS — Z9889 Other specified postprocedural states: Secondary | ICD-10-CM

## 2021-07-20 MED ORDER — ONDANSETRON 4 MG PO TBDP: 4.0000 mg | ORAL_TABLET | Freq: Three times a day (TID) | ORAL | 4 refills | Status: DC | PRN

## 2021-07-20 NOTE — Telephone Encounter (Signed)
Patient was informed.

## 2021-07-20 NOTE — Telephone Encounter (Signed)
Daughter called requesting a nausea medication be sent in for patient due to her having nausea wit her hiatal hernia. ?

## 2021-07-20 NOTE — Telephone Encounter (Signed)
I sent in a refill of zofran for postoperative nausea ?lp ?

## 2021-07-30 DIAGNOSIS — H401133 Primary open-angle glaucoma, bilateral, severe stage: Secondary | ICD-10-CM | POA: Diagnosis not present

## 2021-08-02 DIAGNOSIS — H524 Presbyopia: Secondary | ICD-10-CM | POA: Diagnosis not present

## 2021-08-03 ENCOUNTER — Ambulatory Visit (INDEPENDENT_AMBULATORY_CARE_PROVIDER_SITE_OTHER): Payer: Medicare Other

## 2021-08-03 DIAGNOSIS — Z0000 Encounter for general adult medical examination without abnormal findings: Secondary | ICD-10-CM | POA: Diagnosis not present

## 2021-08-03 NOTE — Progress Notes (Signed)
? ?Subjective:  ? Amber Brown is a 88 y.o. female who presents for an Initial Medicare Annual Wellness Visit. ? ?I connected with  Amber Brown on 08/03/21 by a audio enabled telemedicine application and verified that I am speaking with the correct person using two identifiers. ? ?Patient Location: Home ? ?Provider Location: Home Office ? ?I discussed the limitations of evaluation and management by telemedicine. The patient expressed understanding and agreed to proceed.  ? ?   ?Objective:  ?  ?There were no vitals filed for this visit. ?There is no height or weight on file to calculate BMI. ? ? ?  12/21/2020  ?  7:08 AM 12/15/2020  ?  2:13 PM  ?Advanced Directives  ?Does Patient Have a Medical Advance Directive? No Yes  ?Type of Advance Directive  Healthcare Power of Attorney  ?Would patient like information on creating a medical advance directive? No - Patient declined   ? ? ?Current Medications (verified) ?Outpatient Encounter Medications as of 08/03/2021  ?Medication Sig  ? alendronate (FOSAMAX) 70 MG tablet TAKE 1 TABLET BY MOUTH  WEEKLY  ? aspirin 81 MG EC tablet Take 81 mg by mouth daily.   ? atorvastatin (LIPITOR) 40 MG tablet TAKE 1 TABLET BY MOUTH  DAILY  ? calcium carbonate (OSCAL) 1500 (600 Ca) MG TABS tablet Take 600 mg of elemental calcium by mouth daily with breakfast.  ? clopidogrel (PLAVIX) 75 MG tablet TAKE 1 TABLET BY MOUTH  DAILY  ? dorzolamide-timolol (COSOPT) 22.3-6.8 MG/ML ophthalmic solution Place 1 drop into both eyes 2 (two) times daily.   ? levothyroxine (SYNTHROID) 50 MCG tablet TAKE 1 TABLET BY MOUTH  DAILY  ? LORazepam (ATIVAN) 0.5 MG tablet Take 1 tablet (0.5 mg total) by mouth 2 (two) times daily as needed for anxiety (dizziness).  ? meclizine (ANTIVERT) 25 MG tablet Take 1 tablet (25 mg total) by mouth 3 (three) times daily as needed for dizziness.  ? metoprolol succinate (TOPROL-XL) 25 MG 24 hr tablet Take 1 tablet (25 mg total) by mouth daily.  ? Multiple Vitamins-Minerals (OCUVITE  ADULT FORMULA PO) Take 1 tablet by mouth daily.  ? ondansetron (ZOFRAN-ODT) 4 MG disintegrating tablet Take 1 tablet (4 mg total) by mouth every 8 (eight) hours as needed.  ? pantoprazole (PROTONIX) 40 MG tablet Take 1 tablet (40 mg total) by mouth daily.  ? ROCKLATAN 0.02-0.005 % SOLN Place 1 drop into both eyes at bedtime.  ? sertraline (ZOLOFT) 25 MG tablet TAKE 1 TABLET(25 MG) BY MOUTH DAILY  ? SSD 1 % cream Apply topically daily as needed (abrasions).  ? zolpidem (AMBIEN) 5 MG tablet TAKE 1 TABLET(5 MG) BY MOUTH AT BEDTIME AS NEEDED FOR SLEEP  ? ?No facility-administered encounter medications on file as of 08/03/2021.  ? ? ?Allergies (verified) ?Meperidine  ? ?History: ?Past Medical History:  ?Diagnosis Date  ? Arthritis   ? Degenerative scoliosis in adult patient 01/06/2015  ? GERD with esophagitis 05/28/2019  ? Glaucoma 02/20/2017  ? History of kidney stones   ? Hypertension 02/20/2017  ? Hypothyroidism, adult 05/28/2019  ? Mixed hyperlipidemia 05/28/2019  ? Osteoporosis of forearm without pathological fracture 05/28/2019  ? Peripheral vascular disease ([place]) 05/28/2019  ? Sequelae of cerebral infarction 05/28/2019  ? Stroke [facility])   ? 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  ? ?Past Surgical History:  ?Procedure Laterality Date  ? ABDOMINAL HYSTERECTOMY    ? CHOLECYSTECTOMY    ? TOTAL KNEE ARTHROPLASTY Right 12/21/2020  ?  Procedure: TOTAL KNEE ARTHROPLASTY;  Surgeon: Vickey Huger, MD;  Location: [facility];  Service: Orthopedics;  Laterality: Right;  ? ?Family History  ?Problem Relation Age of Onset  ? Aneurysm Mother   ? Bone cancer Father   ? Heart Problems Brother   ? Alcohol abuse Brother   ? Lung cancer Brother   ? ?Social History  ? ?Socioeconomic History  ? Marital status: Widowed  ?  Spouse name: Not on file  ? Number of children: Not on file  ? Years of education: Not on file  ? Highest education level: Not on file  ?Occupational History  ? Not on file  ?Tobacco Use  ? Smoking status: Never  ? Smokeless tobacco:  Never  ?Vaping Use  ? Vaping Use: Never used  ?Substance and Sexual Activity  ? Alcohol use: Never  ? Drug use: Never  ? Sexual activity: Not Currently  ?Other Topics Concern  ? Not on file  ?Social History Narrative  ? Not on file  ? ?Social Determinants of Health  ? ?Financial Resource Strain: Not on file  ?Food Insecurity: Not on file  ?Transportation Needs: Not on file  ?Physical Activity: Not on file  ?Stress: Not on file  ?Social Connections: Not on file  ? ? ?Tobacco Counseling ?Counseling given: Not Answered ? ? ?Clinical Intake: ? ?  ? ?  ? ?  ? ?  ? ?  ? ?Diabetic?No ? ?  ? ?  ? ? ?Activities of Daily Living ? ?  12/21/2020  ?  1:39 PM 12/21/2020  ?  1:38 PM  ?In your present state of health, do you have any difficulty performing the following activities:  ?Hearing?  0  ?Vision?  1  ?Difficulty concentrating or making decisions?  0  ?Walking or climbing stairs?  1  ?Dressing or bathing?  0  ?Doing errands, shopping? 0   ? ? ?Patient Care Team: ?Lillard Anes, MD as PCP - General (Family Medicine) ?Burnice Logan, [place] (Inactive) as Pharmacist (Pharmacist) ? ?Indicate any recent Medical Services you may have received from other than [facility] providers in the past year (date may be approximate). ? ?   ?Assessment:  ? This is a routine wellness examination for [name]. ? ?Hearing/Vision screen ?No results found. ? ?Dietary issues and exercise activities discussed: ?  ? ? Goals Addressed   ?None ?  ?Depression Screen ? ?  06/04/2020  ?  9:10 AM 06/04/2020  ?  9:03 AM 06/04/2020  ?  8:47 AM 05/29/2019  ?  9:32 AM 05/28/2019  ? 10:22 AM  ?PHQ 2/9 Scores  ?PHQ - 2 Score 0 0 0 1 2  ?PHQ- 9 Score 0   3   ?  ?Fall Risk ? ?  06/04/2020  ?  9:03 AM 10/02/2019  ? 11:11 AM 05/28/2019  ? 10:22 AM  ?Fall Risk   ?Falls in the past year? 0 0 0  ?Number falls in past yr: 0 0 0  ?Injury with Fall? 0 0 0  ?Follow up Falls evaluation completed Falls evaluation completed Falls evaluation completed  ? ? ?FALL RISK PREVENTION PERTAINING TO  THE HOME: ? ?Any stairs in or around the home? Yes  ?If so, are there any without handrails? Yes  ?Home free of loose throw rugs in walkways, pet beds, electrical cords, etc? Yes  ?Adequate lighting in your home to reduce risk of falls? Yes  ? ?ASSISTIVE DEVICES UTILIZED TO PREVENT FALLS: ? ?Life alert? No  ?  Use of a cane, walker or w/c? Yes  ?Grab bars in the bathroom? Yes  ?Shower chair or bench in shower? Yes  ?Elevated toilet seat or a handicapped toilet? No  ? ?TIMED UP AND GO: ? ?Was the test performed?  N/A .  ?Length of time to ambulate 10 feet: N/A sec.  ? ? ? ?Cognitive Function: ? ?  06/04/2020  ?  9:44 AM  ?MMSE - Mini Mental State Exam  ?Orientation to time 5  ?Orientation to Place 5  ?Registration 3  ?Attention/ Calculation 5  ?Recall 0  ?Language- name 2 objects 2  ?Language- repeat 1  ?Language- follow 3 step command 3  ?Language- read & follow direction 0  ?Write a sentence 1  ?Copy design 0  ?Total score 25  ? ?  ?  ? ?Immunizations ?Immunization History  ?Administered Date(s) Administered  ? Influenza-Unspecified 01/27/2020  ? PFIZER(Purple Top)SARS-COV-2 Vaccination 05/13/2019, 06/03/2019  ? Pneumococcal Conjugate-13 12/11/2017  ? Pneumococcal Polysaccharide-23 12/22/2010  ? Zoster Recombinat (Shingrix) 04/26/2018  ? Zoster, Live 01/10/2018  ? ? ?TDAP status: Due, Education has been provided regarding the importance of this vaccine. Advised may receive this vaccine at local pharmacy or Health Dept. Aware to provide a copy of the vaccination record if obtained from local pharmacy or Health Dept. Verbalized acceptance and understanding. ? ?Flu Vaccine status: Up to date ? ?Pneumococcal vaccine status: Up to date ? ?Covid-19 vaccine status: Completed vaccines ? ?Qualifies for Shingles Vaccine? Yes   ?Zostavax completed  Patient had 1 of the 2 vaccines.   ?Shingrix Completed?: No.    Education has been provided regarding the importance of this vaccine. Patient has been advised to call insurance  company to determine out of pocket expense if they have not yet received this vaccine. Advised may also receive vaccine at local pharmacy or Health Dept. Verbalized acceptance and understanding. ? ?Screening

## 2021-08-12 ENCOUNTER — Other Ambulatory Visit: Payer: Self-pay | Admitting: Legal Medicine

## 2021-08-26 DIAGNOSIS — R634 Abnormal weight loss: Secondary | ICD-10-CM | POA: Diagnosis not present

## 2021-08-26 DIAGNOSIS — Z7902 Long term (current) use of antithrombotics/antiplatelets: Secondary | ICD-10-CM | POA: Diagnosis not present

## 2021-08-26 DIAGNOSIS — R112 Nausea with vomiting, unspecified: Secondary | ICD-10-CM | POA: Diagnosis not present

## 2021-08-26 DIAGNOSIS — K449 Diaphragmatic hernia without obstruction or gangrene: Secondary | ICD-10-CM | POA: Diagnosis not present

## 2021-08-26 DIAGNOSIS — K5900 Constipation, unspecified: Secondary | ICD-10-CM | POA: Diagnosis not present

## 2021-09-08 DIAGNOSIS — I10 Essential (primary) hypertension: Secondary | ICD-10-CM | POA: Diagnosis not present

## 2021-09-08 DIAGNOSIS — K219 Gastro-esophageal reflux disease without esophagitis: Secondary | ICD-10-CM | POA: Diagnosis not present

## 2021-09-08 DIAGNOSIS — R112 Nausea with vomiting, unspecified: Secondary | ICD-10-CM | POA: Diagnosis not present

## 2021-09-08 DIAGNOSIS — K440 Diaphragmatic hernia with obstruction, without gangrene: Secondary | ICD-10-CM | POA: Diagnosis not present

## 2021-09-27 ENCOUNTER — Telehealth: Payer: Self-pay

## 2021-09-27 NOTE — Progress Notes (Signed)
Chronic Care Management Pharmacy Assistant   Name: Amber Brown  MRN: [id number] DOB: Jan 22, 1929   Reason for Encounter: General Adherence Call    Recent office visits:  08/03/21 Amber Hammock LPN. Seen for Medicare Annual Wellness. No med changes.  05/10/21 Amber Meeker MD. Seen for Sinusitis. Referral to Gastroenterology. No med changes.  04/15/21 Amber Brown. Seen for abdominal pain. No med changes.  Recent consult visits:  04/27/21 (Cardiology) Amber More MD. Seen for Syncope. DECREASE: Metoprolol to once daily.  04/01/21 (Sports Medicine) Amber Ast PA-C. Seen for post op. No med changes.  Hospital visits:  None  Medications: Outpatient Encounter Medications as of 09/27/2021  Medication Sig   alendronate (FOSAMAX) 70 MG tablet TAKE 1 TABLET BY MOUTH  WEEKLY   aspirin 81 MG EC tablet Take 81 mg by mouth daily.    atorvastatin (LIPITOR) 40 MG tablet TAKE 1 TABLET BY MOUTH  DAILY   calcium carbonate (OSCAL) 1500 (600 Ca) MG TABS tablet Take 600 mg of elemental calcium by mouth daily with breakfast.   clopidogrel (PLAVIX) 75 MG tablet TAKE 1 TABLET BY MOUTH  DAILY   dorzolamide-timolol (COSOPT) 22.3-6.8 MG/ML ophthalmic solution Place 1 drop into both eyes 2 (two) times daily.    levothyroxine (SYNTHROID) 50 MCG tablet TAKE 1 TABLET BY MOUTH  DAILY   LORazepam (ATIVAN) 0.5 MG tablet Take 1 tablet (0.5 mg total) by mouth 2 (two) times daily as needed for anxiety (dizziness).   meclizine (ANTIVERT) 25 MG tablet Take 1 tablet (25 mg total) by mouth 3 (three) times daily as needed for dizziness.   metoprolol succinate (TOPROL-XL) 25 MG 24 hr tablet Take 1 tablet (25 mg total) by mouth daily.   Multiple Vitamins-Minerals (OCUVITE ADULT FORMULA PO) Take 1 tablet by mouth daily.   ondansetron (ZOFRAN-ODT) 4 MG disintegrating tablet Take 1 tablet (4 mg total) by mouth every 8 (eight) hours as needed.   pantoprazole (PROTONIX) 40 MG tablet TAKE 1 TABLET BY MOUTH  DAILY   ROCKLATAN 0.02-0.005 % SOLN Place 1 drop into both eyes at bedtime.   sertraline (ZOLOFT) 25 MG tablet TAKE 1 TABLET(25 MG) BY MOUTH DAILY   SSD 1 % cream Apply topically daily as needed (abrasions).   zolpidem (AMBIEN) 5 MG tablet TAKE 1 TABLET(5 MG) BY MOUTH AT BEDTIME AS NEEDED FOR SLEEP   No facility-administered encounter medications on file as of 09/27/2021.    [place] for General Review Call   Chart Review:  Have there been any documented new, changed, or discontinued medications since last visit? Yes, decreased metoprolol  Has there been any documented recent hospitalizations or ED visits since last visit with Clinical Pharmacist? No Brief Summary (including medication and/or Diagnosis changes):None noted    Adherence Review:  Does the Clinical Pharmacist Assistant have access to adherence rates? Yes Adherence rates for STAR metric medications (List medication(s)/day supply/ last 2 fill dates). Atorvastatin 34/23 100ds 02/26/21 90ds Adherence rates for medications indicated for disease state being reviewed (List medication(s)/day supply/ last 2 fill dates). Does the patient have >5 day gap between last estimated fill dates for any of the above medications or other medication gaps? Yes Reason for medication gaps.   Disease State Questions:  Able to connect with Patient? Yes Did patient have any problems with their health recently? No  Have you had any admissions or emergency room visits or worsening of your condition(s) since last visit? No Details of ED visit, hospital  visit and/or worsening condition(s): Have you had any visits with new specialists or providers since your last visit? No  Have you had any new health care problem(s) since your last visit? Yes New problem(s) reported: Pt is seeing a Copywriter, advertising for her hernia that is causing her nausea and stomach pain Have you run out of any of your medications since you last spoke with  clinical pharmacist? No What caused you to run out of your medications? Are there any medications you are not taking as prescribed? No What kept you from taking your medications as prescribed? Are you having any issues or side effects with your medications? No Note of issues or side effects: Do you have any other health concerns or questions you want to discuss with your Clinical Pharmacist before your next visit? No Note additional concerns and questions from Patient. Are there any health concerns that you feel we can do a better job addressing? No Note Patient's response. Are you having any problems with any of the following since the last visit: (select all that apply)  Eating, Driving   Details: Pt has terrible vision and cannot drive and with her hernia. It has caused her to loose weight from nausea  12. Any falls since last visit? No  Details: 13. Any increased or uncontrolled pain since last visit? No  Details:  15. Additional Details? Yes, pt was interested in looking into Transportation services since she can't drive and she has help from her niece but she stated sometimes they are busy and may not always be around. She did not have anything to write any numbers down so I called her Niece and left the [employer] transportation service number with her and stated she could help with setting her up by calling that number.The niece understood and stated they will make sure she gets set up.   Amber Brown, [place] Pharmacist Assistant  [phone]

## 2021-10-29 ENCOUNTER — Other Ambulatory Visit: Payer: Self-pay | Admitting: Legal Medicine

## 2021-11-10 ENCOUNTER — Other Ambulatory Visit: Payer: Self-pay | Admitting: Legal Medicine

## 2021-11-10 DIAGNOSIS — Z961 Presence of intraocular lens: Secondary | ICD-10-CM | POA: Diagnosis not present

## 2021-11-10 DIAGNOSIS — H401133 Primary open-angle glaucoma, bilateral, severe stage: Secondary | ICD-10-CM | POA: Diagnosis not present

## 2021-11-10 DIAGNOSIS — I739 Peripheral vascular disease, unspecified: Secondary | ICD-10-CM

## 2021-11-10 DIAGNOSIS — E039 Hypothyroidism, unspecified: Secondary | ICD-10-CM

## 2021-11-20 ENCOUNTER — Other Ambulatory Visit: Payer: Self-pay | Admitting: Legal Medicine

## 2021-12-07 ENCOUNTER — Ambulatory Visit (INDEPENDENT_AMBULATORY_CARE_PROVIDER_SITE_OTHER): Payer: Medicare Other | Admitting: Legal Medicine

## 2021-12-07 ENCOUNTER — Encounter: Payer: Self-pay | Admitting: Legal Medicine

## 2021-12-07 VITALS — BP 124/64 | HR 76 | Temp 98.1°F | Resp 15 | Ht 60.0 in | Wt 127.0 lb

## 2021-12-07 DIAGNOSIS — Z6824 Body mass index (BMI) 24.0-24.9, adult: Secondary | ICD-10-CM | POA: Diagnosis not present

## 2021-12-07 DIAGNOSIS — M4150 Other secondary scoliosis, site unspecified: Secondary | ICD-10-CM | POA: Diagnosis not present

## 2021-12-07 MED ORDER — TRIAMCINOLONE ACETONIDE 40 MG/ML IJ SUSP: 80.0000 mg | Freq: Once | INTRAMUSCULAR | Status: DC

## 2021-12-07 NOTE — Progress Notes (Signed)
Acute Office Visit  Subjective:    Patient ID: Amber Brown, female    DOB: 02/18/1929, 88 y.o.   MRN: [id number]  Chief Complaint  Patient presents with   Back Pain    Left sided    HPI: Patient is in today for back pain on left sided since 5 years ago. She mentioned this is aching pain with no radiation. She denies headache, dysuria, abdominal pain, numbness. Uses OTC medicines and gets around. Trouble reading and walking.  Past Medical History:  Diagnosis Date   Arthritis    Degenerative scoliosis in adult patient 01/06/2015   GERD with esophagitis 05/28/2019   Glaucoma 02/20/2017   History of kidney stones    Hypertension 02/20/2017   Hypothyroidism, adult 05/28/2019   Mixed hyperlipidemia 05/28/2019   Osteoporosis of forearm without pathological fracture 05/28/2019   Peripheral vascular disease ([facility]) 05/28/2019   Sequelae of cerebral infarction 05/28/2019   Stroke [facility])    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    Past Surgical History:  Procedure Laterality Date   ABDOMINAL HYSTERECTOMY     CHOLECYSTECTOMY     TOTAL KNEE ARTHROPLASTY Right 12/21/2020   Procedure: TOTAL KNEE ARTHROPLASTY;  Surgeon: Dannielle Huh, MD;  Location: [facility];  Service: Orthopedics;  Laterality: Right;    Family History  Problem Relation Age of Onset   Aneurysm Mother    Bone cancer Father    Heart Problems Brother    Alcohol abuse Brother    Lung cancer Brother     Social History   Socioeconomic History   Marital status: Widowed    Spouse name: Not on file   Number of children: Not on file   Years of education: Not on file   Highest education level: Not on file  Occupational History   Not on file  Tobacco Use   Smoking status: Never   Smokeless tobacco: Never  Vaping Use   Vaping Use: Never used  Substance and Sexual Activity   Alcohol use: Never   Drug use: Never   Sexual activity: Not Currently  Other Topics Concern   Not on file  Social History Narrative   Not on file    Social Determinants of Health   Financial Resource Strain: Low Risk  (08/03/2021)   Overall Financial Resource Strain (CARDIA)    Difficulty of Paying Living Expenses: Not hard at all  Food Insecurity: No Food Insecurity (08/03/2021)   Hunger Vital Sign    Worried About Running Out of Food in the Last Year: Never true    Ran Out of Food in the Last Year: Never true  Transportation Needs: No Transportation Needs (08/03/2021)   PRAPARE - Administrator, Civil Service (Medical): No    Lack of Transportation (Non-Medical): No  Physical Activity: Inactive (08/03/2021)   Exercise Vital Sign    Days of Exercise per Week: 0 days    Minutes of Exercise per Session: 0 min  Stress: No Stress Concern Present (08/03/2021)   [employer] of Occupational Health - Occupational Stress Questionnaire    Feeling of Stress : Not at all  Social Connections: Moderately Isolated (08/03/2021)   Social Connection and Isolation Panel [NHANES]    Frequency of Communication with Friends and Family: More than three times a week    Frequency of Social Gatherings with Friends and Family: Twice a week    Attends Religious Services: More than 4 times per year    Active Member of  Clubs or Organizations: No    Attends Archivist Meetings: Never    Marital Status: Widowed  Intimate Partner Violence: Not At Risk (08/03/2021)   Humiliation, Afraid, Rape, and Kick questionnaire    Fear of Current or Ex-Partner: No    Emotionally Abused: No    Physically Abused: No    Sexually Abused: No    Outpatient Medications Prior to Visit  Medication Sig Dispense Refill   alendronate (FOSAMAX) 70 MG tablet TAKE 1 TABLET BY MOUTH  WEEKLY 12 tablet 3   atorvastatin (LIPITOR) 40 MG tablet TAKE 1 TABLET BY MOUTH DAILY 100 tablet 2   calcium carbonate (OSCAL) 1500 (600 Ca) MG TABS tablet Take 600 mg of elemental calcium by mouth daily with breakfast.     clopidogrel (PLAVIX) 75 MG tablet TAKE 1 TABLET BY  MOUTH DAILY 100 tablet 2   dorzolamide-timolol (COSOPT) 22.3-6.8 MG/ML ophthalmic solution Place 1 drop into both eyes 2 (two) times daily.      levothyroxine (SYNTHROID) 50 MCG tablet TAKE 1 TABLET BY MOUTH DAILY 100 tablet 2   metoprolol succinate (TOPROL-XL) 25 MG 24 hr tablet Take 1 tablet (25 mg total) by mouth daily. 90 tablet 3   metoprolol tartrate (LOPRESSOR) 25 MG tablet      Multiple Vitamins-Minerals (OCUVITE ADULT FORMULA PO) Take 1 tablet by mouth daily.     ondansetron (ZOFRAN-ODT) 4 MG disintegrating tablet Take 1 tablet (4 mg total) by mouth every 8 (eight) hours as needed. 20 tablet 4   ROCKLATAN 0.02-0.005 % SOLN Place 1 drop into both eyes at bedtime.     SSD 1 % cream Apply topically daily as needed (abrasions). 50 g 1   aspirin 81 MG EC tablet Take 81 mg by mouth daily.      LORazepam (ATIVAN) 0.5 MG tablet Take 1 tablet (0.5 mg total) by mouth 2 (two) times daily as needed for anxiety (dizziness). 30 tablet 3   meclizine (ANTIVERT) 25 MG tablet Take 1 tablet (25 mg total) by mouth 3 (three) times daily as needed for dizziness. 30 tablet 0   pantoprazole (PROTONIX) 40 MG tablet TAKE 1 TABLET BY MOUTH DAILY 100 tablet 2   sertraline (ZOLOFT) 25 MG tablet TAKE 1 TABLET(25 MG) BY MOUTH DAILY 30 tablet 3   zolpidem (AMBIEN) 5 MG tablet TAKE 1 TABLET(5 MG) BY MOUTH AT BEDTIME AS NEEDED FOR SLEEP 30 tablet 2   No facility-administered medications prior to visit.    Allergies  Allergen Reactions   Meperidine Nausea Only    Review of Systems  Constitutional:  Negative for chills, fatigue and fever.  HENT:  Negative for congestion, ear pain and sore throat.   Eyes:  Negative for visual disturbance.  Respiratory:  Negative for cough and shortness of breath.   Cardiovascular:  Negative for chest pain and palpitations.  Gastrointestinal:  Negative for abdominal pain, constipation, diarrhea, nausea and vomiting.  Endocrine: Negative for polydipsia, polyphagia and polyuria.   Genitourinary:  Negative for difficulty urinating and dysuria.  Musculoskeletal:  Positive for back pain. Negative for arthralgias and myalgias.  Skin:  Negative for rash.  Neurological:  Negative for headaches.  Psychiatric/Behavioral:  Negative for dysphoric mood. The patient is not nervous/anxious.        Objective:    Physical Exam Vitals reviewed.  Constitutional:      General: She is not in acute distress.    Appearance: Normal appearance. She is obese.  HENT:     Head:  Normocephalic and atraumatic.     Right Ear: Tympanic membrane normal.     Left Ear: Tympanic membrane normal.     Mouth/Throat:     Mouth: Mucous membranes are moist.  Eyes:     Extraocular Movements: Extraocular movements intact.     Conjunctiva/sclera: Conjunctivae normal.     Pupils: Pupils are equal, round, and reactive to light.  Cardiovascular:     Rate and Rhythm: Normal rate and regular rhythm.     Pulses: Normal pulses.     Heart sounds: Normal heart sounds. No murmur heard.    No gallop.  Pulmonary:     Effort: Pulmonary effort is normal. No respiratory distress.     Breath sounds: Normal breath sounds. No wheezing.  Abdominal:     General: Abdomen is flat. Bowel sounds are normal. There is no distension.     Tenderness: There is no abdominal tenderness.  Musculoskeletal:        General: Tenderness (back) present.     Cervical back: Normal range of motion and neck supple.     Right lower leg: No edema.     Left lower leg: No edema.  Skin:    General: Skin is warm.     Capillary Refill: Capillary refill takes less than 2 seconds.  Neurological:     General: No focal deficit present.     Mental Status: She is alert and oriented to person, place, and time. Mental status is at baseline.     Gait: Gait normal.     BP 124/64   Pulse 76   Temp 98.1 F (36.7 C)   Resp 15   Ht 5' (1.524 m)   Wt 127 lb (57.6 kg)   LMP  (LMP Unknown)   SpO2 93%   BMI 24.80 kg/m  Wt Readings from  Last 3 Encounters:  12/07/21 127 lb (57.6 kg)  05/10/21 126 lb (57.2 kg)  04/27/21 123 lb 6.4 oz (56 kg)    Health Maintenance Due  Topic Date Due   TETANUS/TDAP  Never done   Zoster Vaccines- Shingrix (2 of 2) 06/21/2018   COVID-19 Vaccine (3 - Pfizer series) 07/29/2019   INFLUENZA VACCINE  10/19/2021   MAMMOGRAM  11/14/2021       Lab Results  Component Value Date   TSH 2.410 06/04/2020   Lab Results  Component Value Date   WBC 9.6 03/24/2021   HGB 12.5 03/24/2021   HCT 38.4 03/24/2021   MCV 88 03/24/2021   PLT 300 03/24/2021   Lab Results  Component Value Date   NA 136 03/24/2021   K 4.0 03/24/2021   CO2 23 03/24/2021   GLUCOSE 169 (H) 03/24/2021   BUN 12 03/24/2021   CREATININE 0.89 03/24/2021   BILITOT 0.5 03/24/2021   ALKPHOS 85 03/24/2021   AST 20 03/24/2021   ALT 12 03/24/2021   PROT 6.1 03/24/2021   ALBUMIN 4.1 03/24/2021   CALCIUM 9.4 03/24/2021   ANIONGAP 5 12/22/2020   EGFR 62 03/24/2021   Lab Results  Component Value Date   CHOL 122 06/04/2020   Lab Results  Component Value Date   HDL 58 06/04/2020   Lab Results  Component Value Date   LDLCALC 47 06/04/2020   Lab Results  Component Value Date   TRIG 92 06/04/2020   Lab Results  Component Value Date   CHOLHDL 2.1 06/04/2020   No results found for: "HGBA1C"     Assessment & Plan:   Problem  List Items Addressed This Visit       Musculoskeletal and Integument   Degenerative scoliosis in adult patient - Primary   Relevant Medications   triamcinolone acetonide (KENALOG-40) injection 80 mg   Other Relevant Orders   DG Lumbar Spine Complete Patient has recurrent back pain, avoid narcotics, try x-ray and kenalog     Other   BMI 24.0-24.9, adult Continue diet and exercise        Follow-up: Return in about 3 months (around 03/08/2022).  An After Visit Summary was printed and given to the patient.  [name], MD [facility] [phone]

## 2021-12-17 ENCOUNTER — Encounter: Payer: Self-pay | Admitting: Legal Medicine

## 2022-01-05 ENCOUNTER — Other Ambulatory Visit: Payer: Self-pay | Admitting: Cardiology

## 2022-01-12 ENCOUNTER — Ambulatory Visit (INDEPENDENT_AMBULATORY_CARE_PROVIDER_SITE_OTHER): Payer: Medicare Other | Admitting: Family Medicine

## 2022-01-12 ENCOUNTER — Encounter: Payer: Self-pay | Admitting: Family Medicine

## 2022-01-12 VITALS — BP 122/70 | HR 78 | Temp 96.7°F | Resp 16 | Ht 60.0 in | Wt 127.0 lb

## 2022-01-12 DIAGNOSIS — M4150 Other secondary scoliosis, site unspecified: Secondary | ICD-10-CM

## 2022-01-12 DIAGNOSIS — Z23 Encounter for immunization: Secondary | ICD-10-CM | POA: Diagnosis not present

## 2022-01-12 DIAGNOSIS — D631 Anemia in chronic kidney disease: Secondary | ICD-10-CM

## 2022-01-12 DIAGNOSIS — L84 Corns and callosities: Secondary | ICD-10-CM | POA: Diagnosis not present

## 2022-01-12 DIAGNOSIS — R011 Cardiac murmur, unspecified: Secondary | ICD-10-CM

## 2022-01-12 DIAGNOSIS — R7301 Impaired fasting glucose: Secondary | ICD-10-CM | POA: Diagnosis not present

## 2022-01-12 DIAGNOSIS — M810 Age-related osteoporosis without current pathological fracture: Secondary | ICD-10-CM

## 2022-01-12 DIAGNOSIS — I6930 Unspecified sequelae of cerebral infarction: Secondary | ICD-10-CM

## 2022-01-12 DIAGNOSIS — N1832 Chronic kidney disease, stage 3b: Secondary | ICD-10-CM

## 2022-01-12 DIAGNOSIS — H548 Legal blindness, as defined in USA: Secondary | ICD-10-CM | POA: Diagnosis not present

## 2022-01-12 DIAGNOSIS — E782 Mixed hyperlipidemia: Secondary | ICD-10-CM | POA: Diagnosis not present

## 2022-01-12 DIAGNOSIS — I10 Essential (primary) hypertension: Secondary | ICD-10-CM

## 2022-01-12 DIAGNOSIS — R7303 Prediabetes: Secondary | ICD-10-CM | POA: Diagnosis not present

## 2022-01-12 DIAGNOSIS — E039 Hypothyroidism, unspecified: Secondary | ICD-10-CM

## 2022-01-12 MED ORDER — SSD 1 % EX CREA: TOPICAL_CREAM | Freq: Every day | CUTANEOUS | 1 refills | Status: DC | PRN

## 2022-01-12 NOTE — Progress Notes (Signed)
Subjective:  Patient ID: Amber Brown, female    DOB: [birth year]  Age: 88 y.o. MRN: [id number]  Chief Complaint  Patient presents with   Gastroesophageal Reflux   Hyperlipidemia   HPI Patient is a 88 yo female with hypertension, gerd, hypothyroidism, anemia CKD stage 3 b, legally blind,  and history of stroke.  Totally blind in right eye.   Hyperlipidemia: Current medications: atorvastatin 40 mg before bed   Prediabetes: on no medicines.   Osteoporosis: on alendronate 70 mg once weekly.Calcium carbonate 1500 mg daily.   Hypertension: Complications: cva (plavix) Current medications: Toprol XL 25 mg daily.   Hypothyroidism: on Synthroid 50 mg once daily in am.   Diet: healthy. Barium swallow study showed hiatal hernia.   Exercise: none.  No heart issues. No diabetes.    Current Outpatient Medications on File Prior to Visit  Medication Sig Dispense Refill   alendronate (FOSAMAX) 70 MG tablet TAKE 1 TABLET BY MOUTH  WEEKLY 12 tablet 3   atorvastatin (LIPITOR) 40 MG tablet TAKE 1 TABLET BY MOUTH DAILY 100 tablet 2   calcium carbonate (OSCAL) 1500 (600 Ca) MG TABS tablet Take 600 mg of elemental calcium by mouth daily with breakfast.     clopidogrel (PLAVIX) 75 MG tablet TAKE 1 TABLET BY MOUTH DAILY 100 tablet 2   dorzolamide-timolol (COSOPT) 22.3-6.8 MG/ML ophthalmic solution Place 1 drop into both eyes 2 (two) times daily.      levothyroxine (SYNTHROID) 50 MCG tablet TAKE 1 TABLET BY MOUTH DAILY 100 tablet 2   metoprolol succinate (TOPROL-XL) 25 MG 24 hr tablet Take 1 tablet (25 mg total) by mouth daily. 90 tablet 0   Multiple Vitamins-Minerals (OCUVITE ADULT FORMULA PO) Take 1 tablet by mouth daily.     ROCKLATAN 0.02-0.005 % SOLN Place 1 drop into both eyes at bedtime.     Current Facility-Administered Medications on File Prior to Visit  Medication Dose Route Frequency Provider Last Rate Last Admin   triamcinolone acetonide (KENALOG-40) injection 80 mg  80 mg Intramuscular  Once Lillard Anes, MD       Past Medical History:  Diagnosis Date   Arthritis    Degenerative scoliosis in adult patient 01/06/2015   GERD with esophagitis 05/28/2019   Glaucoma 02/20/2017   History of kidney stones    Hypertension 02/20/2017   Hypothyroidism, adult 05/28/2019   Mixed hyperlipidemia 05/28/2019   Osteoporosis of forearm without pathological fracture 05/28/2019   Peripheral vascular disease ([place]) 05/28/2019   Sequelae of cerebral infarction 05/28/2019   Stroke [facility])    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   Past Surgical History:  Procedure Laterality Date   ABDOMINAL HYSTERECTOMY     CHOLECYSTECTOMY     TOTAL KNEE ARTHROPLASTY Right 12/21/2020   Procedure: TOTAL KNEE ARTHROPLASTY;  Surgeon: Vickey Huger, MD;  Location: [facility];  Service: Orthopedics;  Laterality: Right;    Family History  Problem Relation Age of Onset   Aneurysm Mother    Bone cancer Father    Heart Problems Brother    Alcohol abuse Brother    Lung cancer Brother    Social History   Socioeconomic History   Marital status: Widowed    Spouse name: Not on file   Number of children: Not on file   Years of education: Not on file   Highest education level: Not on file  Occupational History   Not on file  Tobacco Use   Smoking status: Never   Smokeless  tobacco: Never  Vaping Use   Vaping Use: Never used  Substance and Sexual Activity   Alcohol use: Never   Drug use: Never   Sexual activity: Not Currently  Other Topics Concern   Not on file  Social History Narrative   Not on file   Social Determinants of Health   Financial Resource Strain: Low Risk  (08/03/2021)   Overall Financial Resource Strain (CARDIA)    Difficulty of Paying Living Expenses: Not hard at all  Food Insecurity: No Food Insecurity (08/03/2021)   Hunger Vital Sign    Worried About Running Out of Food in the Last Year: Never true    [place] in the Last Year: Never true  Transportation Needs: No  Transportation Needs (08/03/2021)   PRAPARE - Hydrologist (Medical): No    Lack of Transportation (Non-Medical): No  Physical Activity: Inactive (08/03/2021)   Exercise Vital Sign    Days of Exercise per Week: 0 days    Minutes of Exercise per Session: 0 min  Stress: No Stress Concern Present (08/03/2021)   [place]    Feeling of Stress : Not at all  Social Connections: Moderately Isolated (08/03/2021)   Social Connection and Isolation Panel [NHANES]    Frequency of Communication with Friends and Family: More than three times a week    Frequency of Social Gatherings with Friends and Family: Twice a week    Attends Religious Services: More than 4 times per year    Active Member of [employer] or Organizations: No    Attends Archivist Meetings: Never    Marital Status: Widowed    Review of Systems  Constitutional:  Negative for chills, fatigue and fever.  HENT:  Negative for congestion, rhinorrhea and sore throat.   Eyes:  Positive for visual disturbance (legally blind).  Respiratory:  Negative for cough and shortness of breath.   Cardiovascular:  Negative for chest pain.  Gastrointestinal:  Negative for abdominal pain, constipation, diarrhea, nausea and vomiting.       GERD  Genitourinary:  Negative for dysuria and urgency.  Musculoskeletal:  Positive for back pain. Negative for myalgias.  Neurological:  Negative for dizziness, weakness, light-headedness and headaches.  Psychiatric/Behavioral:  Negative for dysphoric mood. The patient is not nervous/anxious.      Objective:  BP 122/70   Pulse 78   Temp (!) 96.7 F (35.9 C)   Resp 16   Ht 5' (1.524 m)   Wt 127 lb (57.6 kg)   LMP  (LMP Unknown)   BMI 24.80 kg/m      01/12/2022   10:53 AM 12/07/2021    1:40 PM 05/10/2021    1:53 PM  BP/Weight  Systolic BP [id number]  Diastolic BP 70 64 70  Wt. (Lbs) 127 127 126  BMI  24.8 kg/m2 24.8 kg/m2 24.61 kg/m2    Physical Exam Vitals reviewed.  Constitutional:      Appearance: Normal appearance. She is normal weight.  Neck:     Vascular: No carotid bruit.  Cardiovascular:     Rate and Rhythm: Normal rate and regular rhythm.     Heart sounds: Murmur heard.  Pulmonary:     Effort: Pulmonary effort is normal. No respiratory distress.     Breath sounds: Normal breath sounds.  Abdominal:     General: Abdomen is flat. Bowel sounds are normal.     Palpations: Abdomen  is soft.     Tenderness: There is no abdominal tenderness.  Musculoskeletal:        General: Tenderness (lumbar. scoliosis.) present.  Skin:    Comments: Corn on bottom of right foot.   Neurological:     Mental Status: She is alert.     Gait: Gait abnormal (uses cane).  Psychiatric:        Mood and Affect: Mood normal.        Behavior: Behavior normal.     Diabetic Foot Exam - Simple   No data filed      Lab Results  Component Value Date   WBC 11.6 (H) 01/12/2022   HGB 13.5 01/12/2022   HCT 41.4 01/12/2022   PLT 286 01/12/2022   GLUCOSE 111 (H) 01/12/2022   CHOL 134 01/12/2022   TRIG 86 01/12/2022   HDL 71 01/12/2022   LDLCALC 47 01/12/2022   ALT 28 01/12/2022   AST 23 01/12/2022   NA 134 01/12/2022   K 4.9 01/12/2022   CL 96 01/12/2022   CREATININE 0.89 01/12/2022   BUN 18 01/12/2022   CO2 23 01/12/2022   TSH 1.830 01/12/2022   HGBA1C 6.0 (H) 01/12/2022      Assessment & Plan:   Problem List Items Addressed This Visit       Cardiovascular and Mediastinum   Hypertension    The current medical regimen is effective;  continue present plan and medications.  Continue toprol xl 25 mg daily.         Endocrine   Hypothyroidism, adult    Previously well controlled Continue Synthroid at current dose  Recheck TSH and adjust Synthroid as indicated        Relevant Orders   T4, free (Completed)   TSH (Completed)     Musculoskeletal and Integument    Degenerative scoliosis in adult patient    Continue oscal D      Osteoporosis of forearm without pathological fracture    Continue fosamax and calcium carbonate.        Genitourinary   RESOLVED: Anemia in stage 3b chronic kidney disease (HCC)    Stable.       Relevant Orders   CBC with Differential/Platelet (Completed)     Other   Mixed hyperlipidemia    Well controlled.  No changes to medicines. Continue lipitor 40 mg before bed.  Continue to work on eating a healthy diet and exercise.  Labs drawn today.        Relevant Orders   Comprehensive metabolic panel (Completed)   Lipid panel (Completed)   Sequelae of cerebral infarction    Continue plavix 75 mg daily.       Legally blind   Other Visit Diagnoses     Need for influenza vaccination    -  Primary   Relevant Orders   Flu Vaccine QUAD High Dose(Fluad) (Completed)   Age-related osteoporosis without current pathological fracture       Relevant Orders   DG Bone Density   VITAMIN D 25 Hydroxy (Vit-D Deficiency, Fractures) (Completed)   Corn of foot       Relevant Orders   Ambulatory referral to Podiatry   Murmur       Relevant Orders   ECHOCARDIOGRAM COMPLETE     .  Meds ordered this encounter  Medications   SSD 1 % cream    Sig: Apply topically daily as needed (abrasions).    Dispense:  50 g    Refill:  1    Orders Placed This Encounter  Procedures   DG Bone Density   Flu Vaccine QUAD High Dose(Fluad)   CBC with Differential/Platelet   Comprehensive metabolic panel   Lipid panel   VITAMIN D 25 Hydroxy (Vit-D Deficiency, Fractures)   T4, free   TSH   Cardiovascular Risk Assessment   Hgb A1c w/o eAG   Specimen status report   Ambulatory referral to Podiatry   ECHOCARDIOGRAM COMPLETE     Follow-up: Return in about 4 months (around 05/15/2022) for chronic follow up.  An After Visit Summary was printed and given to the patient.  Rochel Brome, MD [facility] [phone]

## 2022-01-12 NOTE — Patient Instructions (Signed)
Ordering a bone density. Ordering echocardiogram.

## 2022-01-13 LAB — CBC WITH DIFFERENTIAL/PLATELET
Basophils Absolute: 0.1 10*3/uL (ref 0.0–0.2)
Basos: 0 %
EOS (ABSOLUTE): 0.1 10*3/uL (ref 0.0–0.4)
Eos: 1 %
Hematocrit: 41.4 % (ref 34.0–46.6)
Hemoglobin: 13.5 g/dL (ref 11.1–15.9)
Immature Grans (Abs): 0 10*3/uL (ref 0.0–0.1)
Immature Granulocytes: 0 %
Lymphocytes Absolute: 3 10*3/uL (ref 0.7–3.1)
Lymphs: 26 %
MCH: 29.3 pg (ref 26.6–33.0)
MCHC: 32.6 g/dL (ref 31.5–35.7)
MCV: 90 fL (ref 79–97)
Monocytes Absolute: 1 10*3/uL — ABNORMAL HIGH (ref 0.1–0.9)
Monocytes: 9 %
Neutrophils Absolute: 7.3 10*3/uL — ABNORMAL HIGH (ref 1.4–7.0)
Neutrophils: 64 %
Platelets: 286 10*3/uL (ref 150–450)
RBC: 4.6 x10E6/uL (ref 3.77–5.28)
RDW: 12.8 % (ref 11.7–15.4)
WBC: 11.6 10*3/uL — ABNORMAL HIGH (ref 3.4–10.8)

## 2022-01-13 LAB — COMPREHENSIVE METABOLIC PANEL
ALT: 28 IU/L (ref 0–32)
AST: 23 IU/L (ref 0–40)
Albumin/Globulin Ratio: 1.8 (ref 1.2–2.2)
Albumin: 4.2 g/dL (ref 3.6–4.6)
Alkaline Phosphatase: 70 IU/L (ref 44–121)
BUN/Creatinine Ratio: 20 (ref 12–28)
BUN: 18 mg/dL (ref 10–36)
Bilirubin Total: 0.7 mg/dL (ref 0.0–1.2)
CO2: 23 mmol/L (ref 20–29)
Calcium: 9.4 mg/dL (ref 8.7–10.3)
Chloride: 96 mmol/L (ref 96–106)
Creatinine, Ser: 0.89 mg/dL (ref 0.57–1.00)
Globulin, Total: 2.3 g/dL (ref 1.5–4.5)
Glucose: 111 mg/dL — ABNORMAL HIGH (ref 70–99)
Potassium: 4.9 mmol/L (ref 3.5–5.2)
Sodium: 134 mmol/L (ref 134–144)
Total Protein: 6.5 g/dL (ref 6.0–8.5)
eGFR: 61 mL/min/{1.73_m2} (ref >59)

## 2022-01-13 LAB — LIPID PANEL
Chol/HDL Ratio: 1.9 ratio (ref 0.0–4.4)
Cholesterol, Total: 134 mg/dL (ref 100–199)
HDL: 71 mg/dL (ref >39)
LDL Chol Calc (NIH): 47 mg/dL (ref 0–99)
Triglycerides: 86 mg/dL (ref 0–149)
VLDL Cholesterol Cal: 16 mg/dL (ref 5–40)

## 2022-01-13 LAB — TSH: TSH: 1.83 u[IU]/mL (ref 0.450–4.500)

## 2022-01-13 LAB — CARDIOVASCULAR RISK ASSESSMENT

## 2022-01-13 LAB — T4, FREE: Free T4: 1.43 ng/dL (ref 0.82–1.77)

## 2022-01-13 LAB — VITAMIN D 25 HYDROXY (VIT D DEFICIENCY, FRACTURES): Vit D, 25-Hydroxy: 27.1 ng/mL — ABNORMAL LOW (ref 30.0–100.0)

## 2022-01-17 ENCOUNTER — Telehealth: Payer: Self-pay

## 2022-01-17 NOTE — Telephone Encounter (Signed)
Amber Brown called to report that she had not had a bowel movement for several days but she  Took milk of magnesia for 3 days but she finally had a good bowel movement this morning. She is feeling much better now.  She was encouraged to increase her water intake.

## 2022-01-20 LAB — HGB A1C W/O EAG: Hgb A1c MFr Bld: 6 % — ABNORMAL HIGH (ref 4.8–5.6)

## 2022-01-20 LAB — SPECIMEN STATUS REPORT

## 2022-01-20 NOTE — Assessment & Plan Note (Signed)
Well controlled.  No changes to medicines. Continue lipitor 40 mg before bed.  Continue to work on eating a healthy diet and exercise.  Labs drawn today.

## 2022-01-20 NOTE — Assessment & Plan Note (Signed)
Continue fosamax and calcium carbonate.

## 2022-01-20 NOTE — Assessment & Plan Note (Signed)
The current medical regimen is effective;  continue present plan and medications.  Continue toprol xl 25 mg daily.

## 2022-01-20 NOTE — Assessment & Plan Note (Signed)
Continue plavix 75 mg daily.

## 2022-01-20 NOTE — Assessment & Plan Note (Signed)
Previously well controlled Continue Synthroid at current dose  Recheck TSH and adjust Synthroid as indicated   

## 2022-01-20 NOTE — Assessment & Plan Note (Signed)
Continue oscal D

## 2022-01-20 NOTE — Assessment & Plan Note (Signed)
Stable

## 2022-01-24 ENCOUNTER — Ambulatory Visit: Payer: Medicare Other | Admitting: Podiatry

## 2022-01-24 ENCOUNTER — Encounter: Payer: Self-pay | Admitting: Family Medicine

## 2022-01-24 DIAGNOSIS — M79675 Pain in left toe(s): Secondary | ICD-10-CM

## 2022-01-24 DIAGNOSIS — L84 Corns and callosities: Secondary | ICD-10-CM

## 2022-01-24 DIAGNOSIS — B351 Tinea unguium: Secondary | ICD-10-CM

## 2022-01-24 DIAGNOSIS — M79674 Pain in right toe(s): Secondary | ICD-10-CM | POA: Diagnosis not present

## 2022-01-24 NOTE — Progress Notes (Signed)
  Subjective:  Patient ID: Amber Brown, female    DOB: June 05, 1928,  MRN: [id number]  Chief Complaint  Patient presents with   Callouses    Callus to ball of right foot. Patient is not diabetic.    Nail Problem    Nail trim     88 y.o. female presents with the above complaint. History confirmed with patient. Patient presenting with pain related to dystrophic thickened elongated nails. Patient is unable to trim own nails related to nail dystrophy and/or mobility issues. Patient does not have a history of T2DM. Patient does have callus present located at the plantar 4th met head right foot causing pain.   Objective:  Physical Exam: warm, good capillary refill nail exam onychomycosis of the toenails, onycholysis, and dystrophic nails DP pulses palpable, PT pulses palpable, and protective sensation intact Left Foot:  Pain with palpation of nails due to elongation and dystrophic growth.  Right Foot: Pain with palpation of nails due to elongation and dystrophic growth. Hyperkeratotic lesion sub 4th met head right foot with pain on palpation. 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 Pain due to onychomycosis of toenails of both feet   2. Pre-ulcerative calluses      Plan:  Patient was evaluated and treated and all questions answered.  #Hyperkeratotic lesions/pre ulcerative calluses present sub 4th met head right foot All symptomatic hyperkeratoses x 1 separate lesions were safely debrided with a sterile #10 blade to patient's level of comfort without incident. We discussed preventative and palliative care of these lesions including supportive and accommodative shoegear, padding, prefabricated and custom molded accommodative orthoses, use of a pumice stone and lotions/creams daily.  #Onychomycosis with pain  -Nails palliatively debrided as below. -Educated on self-care  Procedure: Nail Debridement Rationale: Pain Type of Debridement: manual, sharp  debridement. Instrumentation: Nail nipper, rotary burr. Number of Nails: 10  Return in about 3 months (around 04/26/2022) for RFC.         Everitt Amber, DPM [facility]

## 2022-01-26 ENCOUNTER — Ambulatory Visit: Payer: Medicare Other | Attending: Cardiology

## 2022-01-26 DIAGNOSIS — R011 Cardiac murmur, unspecified: Secondary | ICD-10-CM

## 2022-01-26 LAB — ECHOCARDIOGRAM COMPLETE
Area-P 1/2: 3.06 cm2
S' Lateral: 2.5 cm

## 2022-02-14 ENCOUNTER — Other Ambulatory Visit: Payer: Self-pay

## 2022-02-14 DIAGNOSIS — E039 Hypothyroidism, unspecified: Secondary | ICD-10-CM

## 2022-02-14 DIAGNOSIS — I739 Peripheral vascular disease, unspecified: Secondary | ICD-10-CM

## 2022-02-14 MED ORDER — CLOPIDOGREL BISULFATE 75 MG PO TABS: 75.0000 mg | ORAL_TABLET | Freq: Every day | ORAL | 2 refills | Status: DC

## 2022-02-14 MED ORDER — ALENDRONATE SODIUM 70 MG PO TABS: 70.0000 mg | ORAL_TABLET | ORAL | 3 refills | Status: DC

## 2022-02-14 MED ORDER — ATORVASTATIN CALCIUM 40 MG PO TABS: 40.0000 mg | ORAL_TABLET | Freq: Every day | ORAL | 2 refills | Status: DC

## 2022-02-14 MED ORDER — LEVOTHYROXINE SODIUM 50 MCG PO TABS: 50.0000 ug | ORAL_TABLET | Freq: Every day | ORAL | 2 refills | Status: DC

## 2022-03-06 ENCOUNTER — Other Ambulatory Visit: Payer: Self-pay | Admitting: Cardiology

## 2022-03-23 DIAGNOSIS — H04129 Dry eye syndrome of unspecified lacrimal gland: Secondary | ICD-10-CM | POA: Diagnosis not present

## 2022-03-23 DIAGNOSIS — H401133 Primary open-angle glaucoma, bilateral, severe stage: Secondary | ICD-10-CM | POA: Diagnosis not present

## 2022-03-25 DIAGNOSIS — M810 Age-related osteoporosis without current pathological fracture: Secondary | ICD-10-CM | POA: Diagnosis not present

## 2022-03-30 ENCOUNTER — Other Ambulatory Visit: Payer: Self-pay

## 2022-03-30 ENCOUNTER — Telehealth: Payer: Self-pay

## 2022-03-30 DIAGNOSIS — M810 Age-related osteoporosis without current pathological fracture: Secondary | ICD-10-CM

## 2022-05-08 ENCOUNTER — Other Ambulatory Visit: Payer: Self-pay | Admitting: Cardiology

## 2022-05-09 DIAGNOSIS — M4120 Other idiopathic scoliosis, site unspecified: Secondary | ICD-10-CM | POA: Diagnosis not present

## 2022-05-09 DIAGNOSIS — M5137 Other intervertebral disc degeneration, lumbosacral region: Secondary | ICD-10-CM | POA: Diagnosis not present

## 2022-05-19 ENCOUNTER — Other Ambulatory Visit: Payer: Self-pay

## 2022-05-22 DIAGNOSIS — Z79899 Other long term (current) drug therapy: Secondary | ICD-10-CM | POA: Diagnosis not present

## 2022-05-22 DIAGNOSIS — I161 Hypertensive emergency: Secondary | ICD-10-CM | POA: Diagnosis not present

## 2022-05-22 DIAGNOSIS — Z743 Need for continuous supervision: Secondary | ICD-10-CM | POA: Diagnosis not present

## 2022-05-22 DIAGNOSIS — N3000 Acute cystitis without hematuria: Secondary | ICD-10-CM | POA: Diagnosis not present

## 2022-05-22 DIAGNOSIS — K5730 Diverticulosis of large intestine without perforation or abscess without bleeding: Secondary | ICD-10-CM | POA: Diagnosis not present

## 2022-05-22 DIAGNOSIS — K8689 Other specified diseases of pancreas: Secondary | ICD-10-CM | POA: Diagnosis not present

## 2022-05-22 DIAGNOSIS — I4510 Unspecified right bundle-branch block: Secondary | ICD-10-CM | POA: Diagnosis not present

## 2022-05-22 DIAGNOSIS — R948 Abnormal results of function studies of other organs and systems: Secondary | ICD-10-CM | POA: Diagnosis not present

## 2022-05-22 DIAGNOSIS — R2681 Unsteadiness on feet: Secondary | ICD-10-CM | POA: Diagnosis not present

## 2022-05-22 DIAGNOSIS — R6889 Other general symptoms and signs: Secondary | ICD-10-CM | POA: Diagnosis not present

## 2022-05-22 DIAGNOSIS — I674 Hypertensive encephalopathy: Secondary | ICD-10-CM | POA: Diagnosis not present

## 2022-05-22 DIAGNOSIS — I10 Essential (primary) hypertension: Secondary | ICD-10-CM | POA: Diagnosis not present

## 2022-05-22 DIAGNOSIS — R9431 Abnormal electrocardiogram [ECG] [EKG]: Secondary | ICD-10-CM | POA: Diagnosis not present

## 2022-05-22 DIAGNOSIS — I444 Left anterior fascicular block: Secondary | ICD-10-CM | POA: Diagnosis not present

## 2022-05-22 DIAGNOSIS — K219 Gastro-esophageal reflux disease without esophagitis: Secondary | ICD-10-CM | POA: Diagnosis not present

## 2022-05-22 DIAGNOSIS — R410 Disorientation, unspecified: Secondary | ICD-10-CM | POA: Diagnosis not present

## 2022-05-23 DIAGNOSIS — I674 Hypertensive encephalopathy: Secondary | ICD-10-CM | POA: Diagnosis not present

## 2022-05-23 DIAGNOSIS — R2681 Unsteadiness on feet: Secondary | ICD-10-CM | POA: Diagnosis not present

## 2022-05-23 DIAGNOSIS — Z79899 Other long term (current) drug therapy: Secondary | ICD-10-CM | POA: Diagnosis not present

## 2022-05-25 DIAGNOSIS — E785 Hyperlipidemia, unspecified: Secondary | ICD-10-CM | POA: Diagnosis not present

## 2022-05-25 DIAGNOSIS — Z8673 Personal history of transient ischemic attack (TIA), and cerebral infarction without residual deficits: Secondary | ICD-10-CM | POA: Diagnosis not present

## 2022-05-25 DIAGNOSIS — K5790 Diverticulosis of intestine, part unspecified, without perforation or abscess without bleeding: Secondary | ICD-10-CM | POA: Diagnosis not present

## 2022-05-25 DIAGNOSIS — M1990 Unspecified osteoarthritis, unspecified site: Secondary | ICD-10-CM | POA: Diagnosis not present

## 2022-05-25 DIAGNOSIS — M4150 Other secondary scoliosis, site unspecified: Secondary | ICD-10-CM | POA: Diagnosis not present

## 2022-05-25 DIAGNOSIS — L84 Corns and callosities: Secondary | ICD-10-CM | POA: Diagnosis not present

## 2022-05-25 DIAGNOSIS — E039 Hypothyroidism, unspecified: Secondary | ICD-10-CM | POA: Diagnosis not present

## 2022-05-25 DIAGNOSIS — I129 Hypertensive chronic kidney disease with stage 1 through stage 4 chronic kidney disease, or unspecified chronic kidney disease: Secondary | ICD-10-CM | POA: Diagnosis not present

## 2022-05-25 DIAGNOSIS — I739 Peripheral vascular disease, unspecified: Secondary | ICD-10-CM | POA: Diagnosis not present

## 2022-05-25 DIAGNOSIS — Z7902 Long term (current) use of antithrombotics/antiplatelets: Secondary | ICD-10-CM | POA: Diagnosis not present

## 2022-05-25 DIAGNOSIS — R7303 Prediabetes: Secondary | ICD-10-CM | POA: Diagnosis not present

## 2022-05-25 DIAGNOSIS — K2100 Gastro-esophageal reflux disease with esophagitis, without bleeding: Secondary | ICD-10-CM | POA: Diagnosis not present

## 2022-05-25 DIAGNOSIS — H548 Legal blindness, as defined in USA: Secondary | ICD-10-CM | POA: Diagnosis not present

## 2022-05-25 DIAGNOSIS — N1832 Chronic kidney disease, stage 3b: Secondary | ICD-10-CM | POA: Diagnosis not present

## 2022-05-25 DIAGNOSIS — M810 Age-related osteoporosis without current pathological fracture: Secondary | ICD-10-CM | POA: Diagnosis not present

## 2022-05-25 DIAGNOSIS — H5461 Unqualified visual loss, right eye, normal vision left eye: Secondary | ICD-10-CM | POA: Diagnosis not present

## 2022-05-25 DIAGNOSIS — I674 Hypertensive encephalopathy: Secondary | ICD-10-CM | POA: Diagnosis not present

## 2022-05-25 DIAGNOSIS — E782 Mixed hyperlipidemia: Secondary | ICD-10-CM | POA: Diagnosis not present

## 2022-05-25 DIAGNOSIS — Z556 Problems related to health literacy: Secondary | ICD-10-CM | POA: Diagnosis not present

## 2022-05-25 DIAGNOSIS — H409 Unspecified glaucoma: Secondary | ICD-10-CM | POA: Diagnosis not present

## 2022-05-25 DIAGNOSIS — D631 Anemia in chronic kidney disease: Secondary | ICD-10-CM | POA: Diagnosis not present

## 2022-05-25 DIAGNOSIS — Z87442 Personal history of urinary calculi: Secondary | ICD-10-CM | POA: Diagnosis not present

## 2022-05-26 DIAGNOSIS — K2100 Gastro-esophageal reflux disease with esophagitis, without bleeding: Secondary | ICD-10-CM | POA: Diagnosis not present

## 2022-05-26 DIAGNOSIS — E785 Hyperlipidemia, unspecified: Secondary | ICD-10-CM | POA: Diagnosis not present

## 2022-05-26 DIAGNOSIS — Z556 Problems related to health literacy: Secondary | ICD-10-CM | POA: Diagnosis not present

## 2022-05-26 DIAGNOSIS — L84 Corns and callosities: Secondary | ICD-10-CM | POA: Diagnosis not present

## 2022-05-26 DIAGNOSIS — E782 Mixed hyperlipidemia: Secondary | ICD-10-CM | POA: Diagnosis not present

## 2022-05-26 DIAGNOSIS — I129 Hypertensive chronic kidney disease with stage 1 through stage 4 chronic kidney disease, or unspecified chronic kidney disease: Secondary | ICD-10-CM | POA: Diagnosis not present

## 2022-05-26 DIAGNOSIS — H5461 Unqualified visual loss, right eye, normal vision left eye: Secondary | ICD-10-CM | POA: Diagnosis not present

## 2022-05-26 DIAGNOSIS — Z8673 Personal history of transient ischemic attack (TIA), and cerebral infarction without residual deficits: Secondary | ICD-10-CM | POA: Diagnosis not present

## 2022-05-26 DIAGNOSIS — N1832 Chronic kidney disease, stage 3b: Secondary | ICD-10-CM | POA: Diagnosis not present

## 2022-05-26 DIAGNOSIS — R7303 Prediabetes: Secondary | ICD-10-CM | POA: Diagnosis not present

## 2022-05-26 DIAGNOSIS — I674 Hypertensive encephalopathy: Secondary | ICD-10-CM | POA: Diagnosis not present

## 2022-05-26 DIAGNOSIS — E039 Hypothyroidism, unspecified: Secondary | ICD-10-CM | POA: Diagnosis not present

## 2022-05-26 DIAGNOSIS — K5790 Diverticulosis of intestine, part unspecified, without perforation or abscess without bleeding: Secondary | ICD-10-CM | POA: Diagnosis not present

## 2022-05-26 DIAGNOSIS — Z87442 Personal history of urinary calculi: Secondary | ICD-10-CM | POA: Diagnosis not present

## 2022-05-26 DIAGNOSIS — M4150 Other secondary scoliosis, site unspecified: Secondary | ICD-10-CM | POA: Diagnosis not present

## 2022-05-26 DIAGNOSIS — D631 Anemia in chronic kidney disease: Secondary | ICD-10-CM | POA: Diagnosis not present

## 2022-05-26 DIAGNOSIS — H548 Legal blindness, as defined in USA: Secondary | ICD-10-CM | POA: Diagnosis not present

## 2022-05-26 DIAGNOSIS — M810 Age-related osteoporosis without current pathological fracture: Secondary | ICD-10-CM | POA: Diagnosis not present

## 2022-05-26 DIAGNOSIS — H409 Unspecified glaucoma: Secondary | ICD-10-CM | POA: Diagnosis not present

## 2022-05-26 DIAGNOSIS — I739 Peripheral vascular disease, unspecified: Secondary | ICD-10-CM | POA: Diagnosis not present

## 2022-05-26 DIAGNOSIS — M1990 Unspecified osteoarthritis, unspecified site: Secondary | ICD-10-CM | POA: Diagnosis not present

## 2022-05-26 DIAGNOSIS — Z7902 Long term (current) use of antithrombotics/antiplatelets: Secondary | ICD-10-CM | POA: Diagnosis not present

## 2022-05-31 ENCOUNTER — Encounter: Payer: Self-pay | Admitting: Family Medicine

## 2022-05-31 ENCOUNTER — Other Ambulatory Visit: Payer: Self-pay | Admitting: Family Medicine

## 2022-05-31 ENCOUNTER — Ambulatory Visit (INDEPENDENT_AMBULATORY_CARE_PROVIDER_SITE_OTHER): Payer: Medicare Other | Admitting: Family Medicine

## 2022-05-31 VITALS — BP 130/76 | HR 71 | Temp 97.1°F | Ht 60.0 in | Wt 131.0 lb

## 2022-05-31 DIAGNOSIS — R7303 Prediabetes: Secondary | ICD-10-CM

## 2022-05-31 DIAGNOSIS — I10 Essential (primary) hypertension: Secondary | ICD-10-CM

## 2022-05-31 DIAGNOSIS — E559 Vitamin D deficiency, unspecified: Secondary | ICD-10-CM

## 2022-05-31 MED ORDER — LOSARTAN POTASSIUM 25 MG PO TABS: 25.0000 mg | ORAL_TABLET | Freq: Every day | ORAL | 0 refills | Status: DC

## 2022-05-31 NOTE — Progress Notes (Unsigned)
Subjective:  Patient ID: Amber Brown, female    DOB: May 04, 1928  Age: 89 y.o. MRN: [id number]  Chief Complaint  Patient presents with   Hospitalization Follow-up    HPI   Follow up Hospitalization  Patient was admitted to [facility] on 05/22/2022 and discharged on 05/23/2022. She was treated for Hypertensive Encephalopathy. Treatment for this included IV beta blocker. She reports excellent compliance with treatment. She reports this condition is improved. Patient was discharged with Metoprolol 50 mg BID.  At home b/p readings have been: 146/73 p64, 138/77 p63, 136/79, 130/78 p73, 162/74 p63, 136/77 p72, 173/97 p79, 157/98 p69, 123/77 p77, 152/92 81, 174/100 p72, 141/85 p79, 147/89 p74, 164/94 p77, 151/91 p78, 130/78 p88, 134/76 p69, 123/78 p75       05/31/2022   11:14 AM 01/12/2022    1:00 PM 08/03/2021   10:43 AM 06/04/2020    9:10 AM 06/04/2020    9:03 AM  Depression screen PHQ 2/9  Decreased Interest 0 0 0 0 0  Down, Depressed, Hopeless 0 0 0 0 0  PHQ - 2 Score 0 0 0 0 0  Altered sleeping    0   Tired, decreased energy    0   Change in appetite    0   Feeling bad or failure about yourself     0   Trouble concentrating    0   Moving slowly or fidgety/restless    0   Suicidal thoughts    0   PHQ-9 Score    0   Difficult doing work/chores    Not difficult at all          12/21/2020    1:41 PM 12/21/2020    9:08 PM 12/22/2020    7:20 AM 08/03/2021   10:46 AM 05/31/2022   11:14 AM  Fall Risk  Falls in the past year?    1 0  Was there an injury with Fall?    0 0  Fall Risk Category Calculator    1 0  Fall Risk Category (Retired)    Low   (RETIRED) Patient Fall Risk Level High fall risk High fall risk High fall risk High fall risk   Patient at Risk for Falls Due to    Impaired balance/gait Impaired vision  Fall risk Follow up    Falls prevention discussed Falls evaluation completed      Review of Systems  Constitutional:  Negative for chills, fatigue and fever.  HENT:   Negative for congestion, ear pain, rhinorrhea and sore throat.   Respiratory:  Negative for cough and shortness of breath.   Cardiovascular:  Negative for chest pain.  Gastrointestinal:  Negative for abdominal pain, constipation, diarrhea, nausea and vomiting.  Genitourinary:  Negative for dysuria and urgency.  Musculoskeletal:  Negative for back pain and myalgias.  Neurological:  Negative for dizziness, weakness, light-headedness and headaches.  Psychiatric/Behavioral:  Negative for dysphoric mood. The patient is not nervous/anxious.     Current Outpatient Medications on File Prior to Visit  Medication Sig Dispense Refill   metoprolol succinate (TOPROL-XL) 50 MG 24 hr tablet Take 50 mg by mouth 2 (two) times daily.     alendronate (FOSAMAX) 70 MG tablet Take 1 tablet (70 mg total) by mouth once a week. Take with a full glass of water on an empty stomach. 12 tablet 3   atorvastatin (LIPITOR) 40 MG tablet Take 1 tablet (40 mg total) by mouth daily. 100 tablet 2   calcium  carbonate (OSCAL) 1500 (600 Ca) MG TABS tablet Take 600 mg of elemental calcium by mouth daily with breakfast.     clopidogrel (PLAVIX) 75 MG tablet Take 1 tablet (75 mg total) by mouth daily. 100 tablet 2   dorzolamide-timolol (COSOPT) 22.3-6.8 MG/ML ophthalmic solution Place 1 drop into both eyes 2 (two) times daily.      levothyroxine (SYNTHROID) 50 MCG tablet Take 1 tablet (50 mcg total) by mouth daily. 100 tablet 2   Multiple Vitamins-Minerals (OCUVITE ADULT FORMULA PO) Take 1 tablet by mouth daily.     ROCKLATAN 0.02-0.005 % SOLN Place 1 drop into both eyes at bedtime.     SSD 1 % cream Apply topically daily as needed (abrasions). 50 g 1   Current Facility-Administered Medications on File Prior to Visit  Medication Dose Route Frequency Provider Last Rate Last Admin   triamcinolone acetonide (KENALOG-40) injection 80 mg  80 mg Intramuscular Once Lillard Anes, MD       Past Medical History:  Diagnosis Date    Arthritis    Degenerative scoliosis in adult patient 01/06/2015   GERD with esophagitis 05/28/2019   Glaucoma 02/20/2017   History of kidney stones    Hypertension 02/20/2017   Hypothyroidism, adult 05/28/2019   Mixed hyperlipidemia 05/28/2019   Osteoporosis of forearm without pathological fracture 05/28/2019   Peripheral vascular disease ([place]) 05/28/2019   Sequelae of cerebral infarction 05/28/2019   Stroke [facility])    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   Past Surgical History:  Procedure Laterality Date   ABDOMINAL HYSTERECTOMY     CHOLECYSTECTOMY     TOTAL KNEE ARTHROPLASTY Right 12/21/2020   Procedure: TOTAL KNEE ARTHROPLASTY;  Surgeon: Vickey Huger, MD;  Location: [facility];  Service: Orthopedics;  Laterality: Right;    Family History  Problem Relation Age of Onset   Aneurysm Mother    Bone cancer Father    Heart Problems Brother    Alcohol abuse Brother    Lung cancer Brother    Social History   Socioeconomic History   Marital status: Widowed    Spouse name: Not on file   Number of children: Not on file   Years of education: Not on file   Highest education level: Not on file  Occupational History   Not on file  Tobacco Use   Smoking status: Never   Smokeless tobacco: Never  Vaping Use   Vaping Use: Never used  Substance and Sexual Activity   Alcohol use: Never   Drug use: Never   Sexual activity: Not Currently  Other Topics Concern   Not on file  Social History Narrative   Not on file   Social Determinants of Health   Financial Resource Strain: Low Risk  (08/03/2021)   Overall Financial Resource Strain (CARDIA)    Difficulty of Paying Living Expenses: Not hard at all  Food Insecurity: No Food Insecurity (08/03/2021)   Hunger Vital Sign    Worried About Running Out of Food in the Last Year: Never true    [place] in the Last Year: Never true  Transportation Needs: No Transportation Needs (08/03/2021)   PRAPARE - Hydrologist  (Medical): No    Lack of Transportation (Non-Medical): No  Physical Activity: Inactive (08/03/2021)   Exercise Vital Sign    Days of Exercise per Week: 0 days    Minutes of Exercise per Session: 0 min  Stress: No Stress Concern Present (08/03/2021)  [employer] - Occupational Stress Questionnaire    Feeling of Stress : Not at all  Social Connections: Moderately Isolated (08/03/2021)   Social Connection and Isolation Panel [NHANES]    Frequency of Communication with Friends and Family: More than three times a week    Frequency of Social Gatherings with Friends and Family: Twice a week    Attends Religious Services: More than 4 times per year    Active Member of [employer] or Organizations: No    Attends Archivist Meetings: Never    Marital Status: Widowed    Objective:  BP 130/76   Pulse 71   Temp (!) 97.1 F (36.2 C)   Ht 5' (1.524 m)   Wt 131 lb (59.4 kg)   LMP  (LMP Unknown)   SpO2 99%   BMI 25.58 kg/m      05/31/2022   11:08 AM 01/12/2022   10:53 AM 12/07/2021    1:40 PM  BP/Weight  Systolic BP [id number]  Diastolic BP 76 70 64  Wt. (Lbs) 131 127 127  BMI 25.58 kg/m2 24.8 kg/m2 24.8 kg/m2    Physical Exam Vitals reviewed.  Constitutional:      Appearance: Normal appearance. She is normal weight.  Neck:     Vascular: No carotid bruit.  Cardiovascular:     Rate and Rhythm: Normal rate and regular rhythm.     Heart sounds: Normal heart sounds.  Pulmonary:     Effort: Pulmonary effort is normal. No respiratory distress.     Breath sounds: Normal breath sounds.  Abdominal:     General: Abdomen is flat. Bowel sounds are normal.     Palpations: Abdomen is soft.     Tenderness: There is no abdominal tenderness.  Neurological:     Mental Status: She is alert and oriented to person, place, and time.  Psychiatric:        Mood and Affect: Mood normal.        Behavior: Behavior normal.     Diabetic Foot Exam - Simple   No data  filed      Lab Results  Component Value Date   WBC 10.1 05/31/2022   HGB 13.1 05/31/2022   HCT 39.9 05/31/2022   PLT 306 05/31/2022   GLUCOSE 99 05/31/2022   CHOL 134 01/12/2022   TRIG 86 01/12/2022   HDL 71 01/12/2022   LDLCALC 47 01/12/2022   ALT 24 05/31/2022   AST 23 05/31/2022   NA 137 05/31/2022   K 4.5 05/31/2022   CL 99 05/31/2022   CREATININE 0.90 05/31/2022   BUN 11 05/31/2022   CO2 23 05/31/2022   TSH 1.830 01/12/2022   HGBA1C 6.3 (H) 05/31/2022      Assessment & Plan:    Primary hypertension Assessment & Plan: Start Losartan 25 mg daily. Check labs.  Orders: -     CBC with Differential/Platelet -     Comprehensive metabolic panel  Prediabetes Assessment & Plan: Hemoglobin A1c 6.0, 3 month avg of blood sugars, is in prediabetic range.  In order to prevent progression to diabetes, recommend low carb diet and regular exercise   Orders: -     Hemoglobin A1c -     Hemoglobin A1c  Mild vitamin D deficiency Assessment & Plan: Check labs.  Orders: -     VITAMIN D 25 Hydroxy (Vit-D Deficiency, Fractures) -     VITAMIN D 25 Hydroxy (Vit-D Deficiency, Fractures)  Meds ordered this encounter  Medications   DISCONTD: losartan (COZAAR) 25 MG tablet    Sig: Take 1 tablet (25 mg total) by mouth daily.    Dispense:  30 tablet    Refill:  0    Orders Placed This Encounter  Procedures   CBC with Differential/Platelet   Comprehensive metabolic panel   VITAMIN D 25 Hydroxy (Vit-D Deficiency, Fractures)   Hemoglobin A1c   Hemoglobin A1c   VITAMIN D 25 Hydroxy (Vit-D Deficiency, Fractures)     Follow-up: Return in about 2 weeks (around 06/14/2022) for Nurse visit, BP CHECK.  I,Marla I Leal-Borjas,acting as a scribe for Rochel Brome, MD.,have documented all relevant documentation on the behalf of Rochel Brome, MD,as directed by  Rochel Brome, MD while in the presence of Rochel Brome, MD.    An After Visit Summary was printed and given to the  patient.  I attest that I have reviewed this visit and agree with the plan scribed by my staff.   Rochel Brome, MD [facility] [phone]

## 2022-06-01 LAB — COMPREHENSIVE METABOLIC PANEL
ALT: 24 IU/L (ref 0–32)
AST: 23 IU/L (ref 0–40)
Albumin/Globulin Ratio: 1.7 (ref 1.2–2.2)
Albumin: 3.8 g/dL (ref 3.6–4.6)
Alkaline Phosphatase: 69 IU/L (ref 44–121)
BUN/Creatinine Ratio: 12 (ref 12–28)
BUN: 11 mg/dL (ref 10–36)
Bilirubin Total: 0.5 mg/dL (ref 0.0–1.2)
CO2: 23 mmol/L (ref 20–29)
Calcium: 9.7 mg/dL (ref 8.7–10.3)
Chloride: 99 mmol/L (ref 96–106)
Creatinine, Ser: 0.9 mg/dL (ref 0.57–1.00)
Globulin, Total: 2.2 g/dL (ref 1.5–4.5)
Glucose: 99 mg/dL (ref 70–99)
Potassium: 4.5 mmol/L (ref 3.5–5.2)
Sodium: 137 mmol/L (ref 134–144)
Total Protein: 6 g/dL (ref 6.0–8.5)
eGFR: 60 mL/min/{1.73_m2} (ref >59)

## 2022-06-01 LAB — CBC WITH DIFFERENTIAL/PLATELET
Basophils Absolute: 0 10*3/uL (ref 0.0–0.2)
Basos: 0 %
EOS (ABSOLUTE): 0.1 10*3/uL (ref 0.0–0.4)
Eos: 1 %
Hematocrit: 39.9 % (ref 34.0–46.6)
Hemoglobin: 13.1 g/dL (ref 11.1–15.9)
Immature Grans (Abs): 0 10*3/uL (ref 0.0–0.1)
Immature Granulocytes: 0 %
Lymphocytes Absolute: 2.3 10*3/uL (ref 0.7–3.1)
Lymphs: 23 %
MCH: 29.2 pg (ref 26.6–33.0)
MCHC: 32.8 g/dL (ref 31.5–35.7)
MCV: 89 fL (ref 79–97)
Monocytes Absolute: 0.8 10*3/uL (ref 0.1–0.9)
Monocytes: 8 %
Neutrophils Absolute: 6.9 10*3/uL (ref 1.4–7.0)
Neutrophils: 68 %
Platelets: 306 10*3/uL (ref 150–450)
RBC: 4.49 x10E6/uL (ref 3.77–5.28)
RDW: 14.1 % (ref 11.7–15.4)
WBC: 10.1 10*3/uL (ref 3.4–10.8)

## 2022-06-01 LAB — HEMOGLOBIN A1C
Est. average glucose Bld gHb Est-mCnc: 134 mg/dL
Hgb A1c MFr Bld: 6.3 % — ABNORMAL HIGH (ref 4.8–5.6)

## 2022-06-01 LAB — VITAMIN D 25 HYDROXY (VIT D DEFICIENCY, FRACTURES): Vit D, 25-Hydroxy: 35.8 ng/mL (ref 30.0–100.0)

## 2022-06-01 NOTE — Progress Notes (Signed)
Blood count normal.  Liver function normal.  Kidney function normal.  HBA1C: 6.3. WORSENED. CLOSE TO BECOMING DIABETIC. Recommend metformin 500 mg daily.  vITAMIN D NORMAL Follow up in 6 months.

## 2022-06-02 ENCOUNTER — Telehealth: Payer: Self-pay

## 2022-06-02 ENCOUNTER — Other Ambulatory Visit: Payer: Self-pay

## 2022-06-02 MED ORDER — METFORMIN HCL 500 MG PO TABS: 500.0000 mg | ORAL_TABLET | Freq: Every day | ORAL | 0 refills | Status: DC

## 2022-06-02 NOTE — Telephone Encounter (Signed)
Nurse called from [facility] and just wanted to let us know that the patient has requested to end CNA visits with home health because she states that she can get her family members to help give her a bath at home. So she was wanting to inform us that per the patient's request they are discontinuing the CNA visits. Documented in patient's chart.

## 2022-06-03 DIAGNOSIS — I739 Peripheral vascular disease, unspecified: Secondary | ICD-10-CM | POA: Diagnosis not present

## 2022-06-03 DIAGNOSIS — I674 Hypertensive encephalopathy: Secondary | ICD-10-CM | POA: Diagnosis not present

## 2022-06-03 DIAGNOSIS — E039 Hypothyroidism, unspecified: Secondary | ICD-10-CM | POA: Diagnosis not present

## 2022-06-03 DIAGNOSIS — H409 Unspecified glaucoma: Secondary | ICD-10-CM | POA: Diagnosis not present

## 2022-06-03 DIAGNOSIS — E785 Hyperlipidemia, unspecified: Secondary | ICD-10-CM | POA: Diagnosis not present

## 2022-06-03 DIAGNOSIS — Z87442 Personal history of urinary calculi: Secondary | ICD-10-CM | POA: Diagnosis not present

## 2022-06-03 DIAGNOSIS — N1832 Chronic kidney disease, stage 3b: Secondary | ICD-10-CM | POA: Diagnosis not present

## 2022-06-03 DIAGNOSIS — Z7902 Long term (current) use of antithrombotics/antiplatelets: Secondary | ICD-10-CM | POA: Diagnosis not present

## 2022-06-03 DIAGNOSIS — H5461 Unqualified visual loss, right eye, normal vision left eye: Secondary | ICD-10-CM | POA: Diagnosis not present

## 2022-06-03 DIAGNOSIS — H548 Legal blindness, as defined in USA: Secondary | ICD-10-CM | POA: Diagnosis not present

## 2022-06-03 DIAGNOSIS — D631 Anemia in chronic kidney disease: Secondary | ICD-10-CM | POA: Diagnosis not present

## 2022-06-03 DIAGNOSIS — K5790 Diverticulosis of intestine, part unspecified, without perforation or abscess without bleeding: Secondary | ICD-10-CM | POA: Diagnosis not present

## 2022-06-03 DIAGNOSIS — L84 Corns and callosities: Secondary | ICD-10-CM | POA: Diagnosis not present

## 2022-06-03 DIAGNOSIS — E782 Mixed hyperlipidemia: Secondary | ICD-10-CM | POA: Diagnosis not present

## 2022-06-03 DIAGNOSIS — Z8673 Personal history of transient ischemic attack (TIA), and cerebral infarction without residual deficits: Secondary | ICD-10-CM | POA: Diagnosis not present

## 2022-06-03 DIAGNOSIS — M1990 Unspecified osteoarthritis, unspecified site: Secondary | ICD-10-CM | POA: Diagnosis not present

## 2022-06-03 DIAGNOSIS — K2100 Gastro-esophageal reflux disease with esophagitis, without bleeding: Secondary | ICD-10-CM | POA: Diagnosis not present

## 2022-06-03 DIAGNOSIS — R7303 Prediabetes: Secondary | ICD-10-CM | POA: Diagnosis not present

## 2022-06-03 DIAGNOSIS — M4150 Other secondary scoliosis, site unspecified: Secondary | ICD-10-CM | POA: Diagnosis not present

## 2022-06-03 DIAGNOSIS — M810 Age-related osteoporosis without current pathological fracture: Secondary | ICD-10-CM | POA: Diagnosis not present

## 2022-06-03 DIAGNOSIS — I129 Hypertensive chronic kidney disease with stage 1 through stage 4 chronic kidney disease, or unspecified chronic kidney disease: Secondary | ICD-10-CM | POA: Diagnosis not present

## 2022-06-03 DIAGNOSIS — Z556 Problems related to health literacy: Secondary | ICD-10-CM | POA: Diagnosis not present

## 2022-06-04 DIAGNOSIS — R7303 Prediabetes: Secondary | ICD-10-CM | POA: Insufficient documentation

## 2022-06-04 DIAGNOSIS — E559 Vitamin D deficiency, unspecified: Secondary | ICD-10-CM | POA: Insufficient documentation

## 2022-06-04 NOTE — Assessment & Plan Note (Signed)
Hemoglobin A1c 6.0%, 3 month avg of blood sugars, is in prediabetic range.  In order to prevent progression to diabetes, recommend low carb diet and regular exercise 

## 2022-06-04 NOTE — Assessment & Plan Note (Signed)
Check labs 

## 2022-06-04 NOTE — Assessment & Plan Note (Signed)
Start Losartan 25 mg daily. Check labs.

## 2022-06-06 ENCOUNTER — Other Ambulatory Visit: Payer: Self-pay

## 2022-06-08 ENCOUNTER — Other Ambulatory Visit: Payer: Self-pay

## 2022-06-08 MED ORDER — PANTOPRAZOLE SODIUM 40 MG PO TBEC: 40.0000 mg | DELAYED_RELEASE_TABLET | Freq: Every day | ORAL | 1 refills | Status: DC

## 2022-06-10 DIAGNOSIS — I129 Hypertensive chronic kidney disease with stage 1 through stage 4 chronic kidney disease, or unspecified chronic kidney disease: Secondary | ICD-10-CM | POA: Diagnosis not present

## 2022-06-10 DIAGNOSIS — D631 Anemia in chronic kidney disease: Secondary | ICD-10-CM

## 2022-06-10 DIAGNOSIS — I739 Peripheral vascular disease, unspecified: Secondary | ICD-10-CM

## 2022-06-10 DIAGNOSIS — N1832 Chronic kidney disease, stage 3b: Secondary | ICD-10-CM | POA: Diagnosis not present

## 2022-06-10 DIAGNOSIS — I674 Hypertensive encephalopathy: Secondary | ICD-10-CM | POA: Diagnosis not present

## 2022-06-15 ENCOUNTER — Telehealth: Payer: Self-pay

## 2022-06-15 ENCOUNTER — Ambulatory Visit (INDEPENDENT_AMBULATORY_CARE_PROVIDER_SITE_OTHER): Payer: Medicare Other

## 2022-06-15 ENCOUNTER — Other Ambulatory Visit: Payer: Self-pay

## 2022-06-15 ENCOUNTER — Other Ambulatory Visit: Payer: Self-pay | Admitting: Family Medicine

## 2022-06-15 VITALS — BP 140/80 | HR 68

## 2022-06-15 DIAGNOSIS — I10 Essential (primary) hypertension: Secondary | ICD-10-CM | POA: Diagnosis not present

## 2022-06-15 MED ORDER — LOSARTAN POTASSIUM 100 MG PO TABS: 100.0000 mg | ORAL_TABLET | Freq: Every day | ORAL | 0 refills | Status: DC

## 2022-06-15 NOTE — Progress Notes (Signed)
Patient was seen on 05/31/2022 for Hypertension. Dr Tobie Poet increased Losartan 25 mg BID. She is today to check BP. Her BP was 156/89 p73 with her BP machine and 140/80 p68 wth manual Blood pressure cuff.   Dr Tobie Poet recommended to increase Losartan to 100 mg daily. I sent a new prescription. I recommended to check her blood pressure at home and write down. If her blood pressure is >140/90 after 2 weeks, she needs to call us back and let us know. Patient and daughter verbalized to understand.  She denied chest pain, SOB, dizziness, headache.

## 2022-06-15 NOTE — Telephone Encounter (Signed)
Elmyra Ricks from [place] called and stated that last weeks visit with patient was cancelled due to patient's gaurdian going out of town so patient would have to go with the [place] out of town. Patient was then scheduled for today which nicole called the patient and had no answer so she called several times today and no answer or return calls back. She also stated that she went by the patient's house and knocked on the door and got no response but she also stated it didn't look like anyone was home nor did she hear anything inside the house. She just wanted to let us know for our documentation that she did try to contact the patient to go out to see her today and still no response.

## 2022-06-20 DIAGNOSIS — H409 Unspecified glaucoma: Secondary | ICD-10-CM | POA: Diagnosis not present

## 2022-06-20 DIAGNOSIS — M1990 Unspecified osteoarthritis, unspecified site: Secondary | ICD-10-CM | POA: Diagnosis not present

## 2022-06-20 DIAGNOSIS — I739 Peripheral vascular disease, unspecified: Secondary | ICD-10-CM | POA: Diagnosis not present

## 2022-06-20 DIAGNOSIS — Z7902 Long term (current) use of antithrombotics/antiplatelets: Secondary | ICD-10-CM | POA: Diagnosis not present

## 2022-06-20 DIAGNOSIS — I129 Hypertensive chronic kidney disease with stage 1 through stage 4 chronic kidney disease, or unspecified chronic kidney disease: Secondary | ICD-10-CM | POA: Diagnosis not present

## 2022-06-20 DIAGNOSIS — E782 Mixed hyperlipidemia: Secondary | ICD-10-CM | POA: Diagnosis not present

## 2022-06-20 DIAGNOSIS — I674 Hypertensive encephalopathy: Secondary | ICD-10-CM | POA: Diagnosis not present

## 2022-06-20 DIAGNOSIS — Z556 Problems related to health literacy: Secondary | ICD-10-CM | POA: Diagnosis not present

## 2022-06-20 DIAGNOSIS — E785 Hyperlipidemia, unspecified: Secondary | ICD-10-CM | POA: Diagnosis not present

## 2022-06-20 DIAGNOSIS — E039 Hypothyroidism, unspecified: Secondary | ICD-10-CM | POA: Diagnosis not present

## 2022-06-20 DIAGNOSIS — M810 Age-related osteoporosis without current pathological fracture: Secondary | ICD-10-CM | POA: Diagnosis not present

## 2022-06-20 DIAGNOSIS — Z8673 Personal history of transient ischemic attack (TIA), and cerebral infarction without residual deficits: Secondary | ICD-10-CM | POA: Diagnosis not present

## 2022-06-20 DIAGNOSIS — L84 Corns and callosities: Secondary | ICD-10-CM | POA: Diagnosis not present

## 2022-06-20 DIAGNOSIS — M4150 Other secondary scoliosis, site unspecified: Secondary | ICD-10-CM | POA: Diagnosis not present

## 2022-06-20 DIAGNOSIS — K5790 Diverticulosis of intestine, part unspecified, without perforation or abscess without bleeding: Secondary | ICD-10-CM | POA: Diagnosis not present

## 2022-06-20 DIAGNOSIS — N1832 Chronic kidney disease, stage 3b: Secondary | ICD-10-CM | POA: Diagnosis not present

## 2022-06-20 DIAGNOSIS — K2100 Gastro-esophageal reflux disease with esophagitis, without bleeding: Secondary | ICD-10-CM | POA: Diagnosis not present

## 2022-06-20 DIAGNOSIS — Z87442 Personal history of urinary calculi: Secondary | ICD-10-CM | POA: Diagnosis not present

## 2022-06-20 DIAGNOSIS — H548 Legal blindness, as defined in USA: Secondary | ICD-10-CM | POA: Diagnosis not present

## 2022-06-20 DIAGNOSIS — H5461 Unqualified visual loss, right eye, normal vision left eye: Secondary | ICD-10-CM | POA: Diagnosis not present

## 2022-06-20 DIAGNOSIS — D631 Anemia in chronic kidney disease: Secondary | ICD-10-CM | POA: Diagnosis not present

## 2022-06-20 DIAGNOSIS — R7303 Prediabetes: Secondary | ICD-10-CM | POA: Diagnosis not present

## 2022-06-22 ENCOUNTER — Telehealth: Payer: Self-pay

## 2022-06-22 NOTE — Telephone Encounter (Signed)
Received call back from [place], she states that she will have Amber Brown evaluated in the ER after her fall.  In addition to the bruising on her head she reports a warm knot on Amber Brown's hip compared to the size of a grapefruit and it is warm to touch.

## 2022-06-22 NOTE — Telephone Encounter (Signed)
Agree 

## 2022-06-22 NOTE — Telephone Encounter (Signed)
Received a VM from Amber Brown in regards to Amber Brown.  She reported that [facility] had a fall hitting her head with lots of brusing to her head and hip.  I attempted to reach Amber Brown and the patient leaving a voice mail recommending that since she takes a blood thinner it is recommended that she be evaluated in the ER after any head trauma.

## 2022-06-29 ENCOUNTER — Telehealth: Payer: Self-pay

## 2022-06-29 NOTE — Telephone Encounter (Signed)
Left voicemail stating patient needed refill for BP medication.  Called back to see what medication needs to be refill, left message for patient's niece to call back.

## 2022-06-30 MED ORDER — LOSARTAN POTASSIUM 100 MG PO TABS: 100.0000 mg | ORAL_TABLET | Freq: Every day | ORAL | 0 refills | Status: DC

## 2022-06-30 NOTE — Telephone Encounter (Signed)
Patient's daughter called back and stated patient needed refill for losartan to go to [facility]. [facility].

## 2022-06-30 NOTE — Addendum Note (Signed)
Addended by: Precious Reel on: 06/30/2022 12:15 PM   Modules accepted: Orders

## 2022-07-07 ENCOUNTER — Other Ambulatory Visit: Payer: Self-pay

## 2022-07-07 ENCOUNTER — Telehealth: Payer: Self-pay

## 2022-07-07 ENCOUNTER — Other Ambulatory Visit: Payer: Self-pay | Admitting: Family Medicine

## 2022-07-07 MED ORDER — AMLODIPINE BESYLATE 5 MG PO TABS: 5.0000 mg | ORAL_TABLET | Freq: Every day | ORAL | 1 refills | Status: DC

## 2022-07-07 NOTE — Telephone Encounter (Signed)
Patient daughter made aware, appt has been schedule. Amiodipine 5 mg

## 2022-07-07 NOTE — Telephone Encounter (Signed)
Patient daughter called and stated that patient BP was 188/90 and checked it again this morning 176/96 and at lunch time it was 167/ something. Stated patient denies chest pain, tingling of arm/face, etc.  She did say she would call back with the bottom number for lunch time she was getting numbers from the care giver and she didn't give her the bottom number but wanted to make you aware of her BP's. Please advise

## 2022-07-12 ENCOUNTER — Other Ambulatory Visit: Payer: Self-pay | Admitting: Family Medicine

## 2022-07-13 ENCOUNTER — Other Ambulatory Visit: Payer: Self-pay | Admitting: Family Medicine

## 2022-07-13 ENCOUNTER — Telehealth: Payer: Self-pay

## 2022-07-13 MED ORDER — LORATADINE 10 MG PO TABS
10.0000 mg | ORAL_TABLET | Freq: Every day | ORAL | 3 refills | Status: DC
Start: 2022-07-13 — End: 2022-09-19

## 2022-07-13 NOTE — Progress Notes (Signed)
Care Management & Coordination Services Pharmacy Team  Reason for Encounter: General adherence update   Contacted patient for general health update and medication adherence call.  Spoke with patient on 07/13/2022    What concerns do you have about your medications? No concerns   The patient denies side effects with their medications.   How often do you forget or accidentally miss a dose? Never  Do you use a pillbox? Yes  Are you having any problems getting your medications from your pharmacy? No  Has the cost of your medications been a concern? No  Since last visit with PharmD, the following interventions have been made. Pt was started on BP medicines and increased some of her BP medicines.  07/13/22 133/82 after medications  The patient has had an ED visit since last contact. Pt went to the hospital for HTN at [facility] 3 weeks ago.   The patient denies problems with their health.   Patient denies concerns or questions for Artelia Laroche, PharmD at this time.   Counseled patient on: Great job taking medications   Chart Updates:  Recent office visits:  07/07/22 Gwynneth Aliment CMA. Orders Only. Started on Amlodipine .   06/15/22 Leal-Borjas Marla CMA. Seen for HTN. Dr Sedalia Muta recommended to increase Losartan to 100 mg daily.   05/31/22 Blane Ohara MD. Seen for HTN. Started on Losartan Potassium  daily. Increased Metoprolol Succinate to  twice daily.   01/12/22 Blane Ohara MD. Seen for routine visit. Referral to Podiatry. D/C Ondansetron .   Recent consult visits:  01/24/22 (Podiatry) Standiford, Alexander DPM. Seen for Nail problem. No med changes.   Hospital visits:  None  Medications: Outpatient Encounter Medications as of 07/13/2022  Medication Sig   amLODipine (NORVASC) 5 MG tablet Take 1 tablet (5 mg total) by mouth daily.   alendronate (FOSAMAX) 70 MG tablet Take 1 tablet (70 mg total) by mouth once a week. Take with a full glass of water on an  empty stomach.   atorvastatin (LIPITOR) 40 MG tablet Take 1 tablet (40 mg total) by mouth daily.   calcium carbonate (OSCAL) 1500 (600 Ca) MG TABS tablet Take 600 mg of elemental calcium by mouth daily with breakfast.   clopidogrel (PLAVIX) 75 MG tablet Take 1 tablet (75 mg total) by mouth daily.   dorzolamide-timolol (COSOPT) 22.3-6.8 MG/ML ophthalmic solution Place 1 drop into both eyes 2 (two) times daily.    levothyroxine (SYNTHROID) 50 MCG tablet Take 1 tablet (50 mcg total) by mouth daily.   losartan (COZAAR) 100 MG tablet TAKE 1 TABLET(100 MG) BY MOUTH DAILY   metFORMIN (GLUCOPHAGE) 500 MG tablet Take 1 tablet (500 mg total) by mouth daily with breakfast.   metoprolol succinate (TOPROL-XL) 50 MG 24 hr tablet Take 50 mg by mouth 2 (two) times daily.   Multiple Vitamins-Minerals (OCUVITE ADULT FORMULA PO) Take 1 tablet by mouth daily.   pantoprazole (PROTONIX) 40 MG tablet Take 1 tablet (40 mg total) by mouth daily.   ROCKLATAN 0.02-0.005 % SOLN Place 1 drop into both eyes at bedtime.   SSD 1 % cream Apply topically daily as needed (abrasions).   Facility-Administered Encounter Medications as of 07/13/2022  Medication   triamcinolone acetonide (KENALOG-40) injection 80 mg    Recent vitals BP Readings from Last 3 Encounters:  06/15/22 (!) 140/80  05/31/22 130/76  01/12/22 122/70   Pulse Readings from Last 3 Encounters:  06/15/22 68  05/31/22 71  01/12/22 78   Wt Readings from Last  3 Encounters:  05/31/22 131 lb (59.4 kg)  01/12/22 127 lb (57.6 kg)  12/07/21 127 lb (57.6 kg)   BMI Readings from Last 3 Encounters:  05/31/22 25.58 kg/m  01/12/22 24.80 kg/m  12/07/21 24.80 kg/m    Recent lab results    Component Value Date/Time   NA 137 05/31/2022 1151   K 4.5 05/31/2022 1151   CL 99 05/31/2022 1151   CO2 23 05/31/2022 1151   GLUCOSE 99 05/31/2022 1151   GLUCOSE 159 (H) 12/22/2020 0324   BUN 11 05/31/2022 1151   CREATININE 0.90 05/31/2022 1151   CALCIUM 9.7  05/31/2022 1151    Lab Results  Component Value Date   CREATININE 0.90 05/31/2022   EGFR 60 05/31/2022   GFRNONAA >60 12/22/2020   GFRAA 60 05/28/2019   Lab Results  Component Value Date/Time   HGBA1C 6.3 (H) 05/31/2022 11:51 AM   HGBA1C 6.0 (H) 01/12/2022 11:32 AM    Lab Results  Component Value Date   CHOL 134 01/12/2022   HDL 71 01/12/2022   LDLCALC 47 01/12/2022   TRIG 86 01/12/2022   CHOLHDL 1.9 01/12/2022    Care Gaps: Annual wellness visit in last year? Yes  If Diabetic:None noted  Last eye exam / retinopathy screening: Last diabetic foot exam: Last UACR:   Star Rating Drugs:  Medication:  Last Fill: Day Supply Atorvastatin   05/24/22-02/15/22 100ds Losartan   07/06/22-06/15/22 90ds Metformin   06/02/22 90ds   Roxana Hires, CMA Clinical Pharmacist Assistant  [phone]

## 2022-07-20 ENCOUNTER — Other Ambulatory Visit: Payer: Self-pay | Admitting: Family Medicine

## 2022-07-20 ENCOUNTER — Other Ambulatory Visit: Payer: Self-pay

## 2022-07-20 MED ORDER — LOSARTAN POTASSIUM 100 MG PO TABS: ORAL_TABLET | ORAL | 0 refills | Status: DC

## 2022-07-20 MED ORDER — AMLODIPINE BESYLATE 5 MG PO TABS: 5.0000 mg | ORAL_TABLET | Freq: Every day | ORAL | 1 refills | Status: DC

## 2022-07-24 ENCOUNTER — Other Ambulatory Visit: Payer: Self-pay | Admitting: Cardiology

## 2022-07-25 ENCOUNTER — Other Ambulatory Visit: Payer: Self-pay | Admitting: Family Medicine

## 2022-07-25 ENCOUNTER — Other Ambulatory Visit: Payer: Self-pay

## 2022-07-25 DIAGNOSIS — I10 Essential (primary) hypertension: Secondary | ICD-10-CM

## 2022-07-25 MED ORDER — AMLODIPINE BESYLATE 5 MG PO TABS: 5.0000 mg | ORAL_TABLET | Freq: Every day | ORAL | 0 refills | Status: DC

## 2022-07-25 MED ORDER — LOSARTAN POTASSIUM 100 MG PO TABS: ORAL_TABLET | ORAL | 0 refills | Status: DC

## 2022-07-25 MED ORDER — AMLODIPINE BESYLATE 5 MG PO TABS: 5.0000 mg | ORAL_TABLET | Freq: Every day | ORAL | 1 refills | Status: DC

## 2022-07-25 MED ORDER — METOPROLOL SUCCINATE ER 50 MG PO TB24: 50.0000 mg | ORAL_TABLET | Freq: Two times a day (BID) | ORAL | 0 refills | Status: DC

## 2022-07-25 NOTE — Telephone Encounter (Signed)
RX has been resent.  

## 2022-07-25 NOTE — Telephone Encounter (Signed)
[name] called on May 4 and left two messages regarding Hazels blood pressure medication. I spoke with [name] who stated that the amlodipine was not sent in and the pharmacy gave her an emergency supply. Can a nurse please resend this prescription  to the pharmacy or call the pharmacy for clarification.

## 2022-07-27 DIAGNOSIS — H04129 Dry eye syndrome of unspecified lacrimal gland: Secondary | ICD-10-CM | POA: Diagnosis not present

## 2022-07-27 DIAGNOSIS — H401133 Primary open-angle glaucoma, bilateral, severe stage: Secondary | ICD-10-CM | POA: Diagnosis not present

## 2022-08-03 ENCOUNTER — Other Ambulatory Visit: Payer: Self-pay | Admitting: Family Medicine

## 2022-08-04 ENCOUNTER — Ambulatory Visit: Payer: Medicare Other

## 2022-08-04 NOTE — Progress Notes (Signed)
Patient came in today for BP check. Patient blood pressure was 126/66 with a pulse of 65. Patient stated she has taken her medication today and is feeling fine but does feel a little weak at times.   Explain to patient we will give her a call back once provider views her BP.

## 2022-08-07 ENCOUNTER — Other Ambulatory Visit: Payer: Self-pay | Admitting: Cardiology

## 2022-08-09 ENCOUNTER — Telehealth: Payer: Self-pay

## 2022-08-09 DIAGNOSIS — I10 Essential (primary) hypertension: Secondary | ICD-10-CM

## 2022-08-09 MED ORDER — AMLODIPINE BESYLATE 5 MG PO TABS: 5.0000 mg | ORAL_TABLET | Freq: Every day | ORAL | 0 refills | Status: DC

## 2022-08-09 MED ORDER — LOSARTAN POTASSIUM 100 MG PO TABS: ORAL_TABLET | ORAL | 0 refills | Status: DC

## 2022-08-09 MED ORDER — METOPROLOL SUCCINATE ER 50 MG PO TB24
50.0000 mg | ORAL_TABLET | Freq: Two times a day (BID) | ORAL | 0 refills | Status: DC
Start: 2022-08-09 — End: 2022-08-17

## 2022-08-09 NOTE — Telephone Encounter (Signed)
Amber Brown called requesting refills from mail order for Amber Brown bp medication.  She also wanted to report that her aunt is having some edema in her lower legs and ankles.  She is experiencing no shortness of breath or other symptoms.  She was encouraged to have her elevate her legs, avoid salt and compression stockings.  She has an upcoming appointment but was instructed to call for an earlier appointment is symptoms worsen or do not improve.

## 2022-08-10 ENCOUNTER — Telehealth: Payer: Self-pay

## 2022-08-10 NOTE — Progress Notes (Signed)
Care Management & Coordination Services Pharmacy Team  Reason for Encounter: Appointment Reminder  Contacted patient to confirm telephone appointment with Artelia Laroche, PharmD on 08/12/22 at 11:30 am.  Unsuccessful outreach. Left voicemail for patient to return call.   Chart review:  Recent office visits:  07/13/22 Blane Ohara MD. Orders Only. Ordered Loratadine 10mg  daily.   Recent consult visits:  None  Hospital visits:  None   Star Rating Drugs:  Medication:  Last Fill: Day Supply Atorvastatin   05/24/22-02/15/22 100ds Losartan  07/06/22-06/15/22 90/30ds Metformin   06/02/22  90ds  Care Gaps: Annual wellness visit in last year? No, overdue since 08/04/22 Mammogram: Overdue since 11/14/21   Roxana Hires, CMA Clinical Pharmacist Assistant  [phone]

## 2022-08-12 ENCOUNTER — Ambulatory Visit: Payer: Medicare Other

## 2022-08-12 NOTE — Patient Outreach (Signed)
Care Management & Coordination Services Pharmacy Note  08/12/2022 Name:  Amber Brown MRN:  [id number] DOB:  [birth year]  Summary: -Pleasant woman presents for f/u visit. She has a woman named Tammy who comes in every day (Except Tuesday and Sunday) to take care of her from 0900-1200  Recommendations/Changes made from today's visit: -With patient's Hx of Osteoporosis, recommend TSH goal of 2-4.5 -Routed high priority msg to PCP regarding HTN/Pulse   Subjective: Amber Brown is an 89 y.o. year old female who is a primary patient of Cox, Kirsten, MD.  The care coordination team was consulted for assistance with disease management and care coordination needs.    Engaged with patient by telephone for follow up visit.  Recent office visits:  07/13/22 Blane Ohara MD. Orders Only. Ordered Loratadine 10mg  daily.    Recent consult visits:  None   Hospital visits:  None   Objective:  Lab Results  Component Value Date   CREATININE 0.90 05/31/2022   BUN 11 05/31/2022   EGFR 60 05/31/2022   GFRNONAA >60 12/22/2020   GFRAA 60 05/28/2019   NA 137 05/31/2022   K 4.5 05/31/2022   CALCIUM 9.7 05/31/2022   CO2 23 05/31/2022   GLUCOSE 99 05/31/2022    Lab Results  Component Value Date/Time   HGBA1C 6.3 (H) 05/31/2022 11:51 AM   HGBA1C 6.0 (H) 01/12/2022 11:32 AM    Last diabetic Eye exam: No results found for: "HMDIABEYEEXA"  Last diabetic Foot exam: No results found for: "HMDIABFOOTEX"   Lab Results  Component Value Date   CHOL 134 01/12/2022   HDL 71 01/12/2022   LDLCALC 47 01/12/2022   TRIG 86 01/12/2022   CHOLHDL 1.9 01/12/2022       Latest Ref Rng & Units 05/31/2022   11:51 AM 01/12/2022   11:32 AM 03/24/2021    2:13 PM  Hepatic Function  Total Protein 6.0 - 8.5 g/dL 6.0  6.5  6.1   Albumin 3.6 - 4.6 g/dL 3.8  4.2  4.1   AST 0 - 40 IU/L 23  23  20    ALT 0 - 32 IU/L 24  28  12    Alk Phosphatase 44 - 121 IU/L 69  70  85   Total Bilirubin 0.0 - 1.2 mg/dL 0.5  0.7  0.5      Lab Results  Component Value Date/Time   TSH 1.830 01/12/2022 11:32 AM   TSH 2.410 06/04/2020 09:34 AM   FREET4 1.43 01/12/2022 11:32 AM       Latest Ref Rng & Units 05/31/2022   11:51 AM 01/12/2022   11:32 AM 03/24/2021    2:13 PM  CBC  WBC 3.4 - 10.8 x10E3/uL 10.1  11.6  9.6   Hemoglobin 11.1 - 15.9 g/dL 40.9  81.1  91.4   Hematocrit 34.0 - 46.6 % 39.9  41.4  38.4   Platelets 150 - 450 x10E3/uL 306  286  300     Lab Results  Component Value Date/Time   VD25OH 35.8 05/31/2022 11:51 AM   VD25OH 27.1 (L) 01/12/2022 11:32 AM    Clinical ASCVD: No  The ASCVD Risk score (Arnett DK, et al., 2019) failed to calculate for the following reasons:   The 2019 ASCVD risk score is only valid for ages 49 to 59   The patient has a prior MI or stroke diagnosis    Other: (CHADS2VASc if Afib, MMRC or CAT for COPD, ACT, DEXA)     05/31/2022  11:14 AM 01/12/2022    1:00 PM 08/03/2021   10:43 AM  Depression screen PHQ 2/9  Decreased Interest 0 0 0  Down, Depressed, Hopeless 0 0 0  PHQ - 2 Score 0 0 0     Social History   Tobacco Use  Smoking Status Never  Smokeless Tobacco Never   BP Readings from Last 3 Encounters:  08/04/22 124/66  06/15/22 (!) 140/80  05/31/22 130/76   Pulse Readings from Last 3 Encounters:  08/04/22 65  06/15/22 68  05/31/22 71   Wt Readings from Last 3 Encounters:  05/31/22 131 lb (59.4 kg)  01/12/22 127 lb (57.6 kg)  12/07/21 127 lb (57.6 kg)   BMI Readings from Last 3 Encounters:  05/31/22 25.58 kg/m  01/12/22 24.80 kg/m  12/07/21 24.80 kg/m    Allergies  Allergen Reactions   Ambien [Zolpidem]     Confusion and off balance.   Meperidine Nausea Only    Medications Reviewed Today     Reviewed by Horald Pollen, CMA (Certified Medical Assistant) on 05/31/22 at 1115  Med List Status: <None>   Medication Order Taking? Sig Documenting Provider Last Dose Status Informant  alendronate (FOSAMAX) 70 MG tablet [id number]  Take 1  tablet (70 mg total) by mouth once a week. Take with a full glass of water on an empty stomach. Cox, Kirsten, MD  Active   atorvastatin (LIPITOR) 40 MG tablet [id number]  Take 1 tablet (40 mg total) by mouth daily. Cox, Kirsten, MD  Active   calcium carbonate (OSCAL) 1500 (600 Ca) MG TABS tablet [id number]  Take 600 mg of elemental calcium by mouth daily with breakfast. [provider]  Active Family Member  clopidogrel (PLAVIX) 75 MG tablet [id number]  Take 1 tablet (75 mg total) by mouth daily. Cox, Kirsten, MD  Active   dorzolamide-timolol (COSOPT) 22.3-6.8 MG/ML ophthalmic solution [id number]  Place 1 drop into both eyes 2 (two) times daily.  [provider]  Active Family Member  levothyroxine (SYNTHROID) 50 MCG tablet [id number]  Take 1 tablet (50 mcg total) by mouth daily. Cox, Kirsten, MD  Active   metoprolol succinate (TOPROL-XL) 50 MG 24 hr tablet [id number] Yes Take 50 mg by mouth 2 (two) times daily. [provider]  Active   Multiple Vitamins-Minerals (OCUVITE ADULT FORMULA PO) [id number]  Take 1 tablet by mouth daily. [provider]  Active Family Member  ROCKLATAN 0.02-0.005 % SOLN [id number]  Place 1 drop into both eyes at bedtime. [provider]  Active Family Member  SSD 1 % cream [id number]  Apply topically daily as needed (abrasions). Cox, Kirsten, MD  Active   triamcinolone acetonide [facility]) injection 80 mg [id number]   Abigail Miyamoto, MD  Active             SDOH:  (Social Determinants of Health) assessments and interventions performed: Yes SDOH Interventions    Flowsheet Row Care Coordination from 08/12/2022 in [facility] Office Visit from 05/31/2022 in [facility] Clinical Support from 08/03/2021 in [facility] Office Visit from 05/28/2019 in [facility]  SDOH Interventions      Food Insecurity Interventions -- -- Intervention Not Indicated --  Housing  Interventions -- -- Intervention Not Indicated --  Transportation Interventions Intervention Not Indicated -- Intervention Not Indicated --  Utilities Interventions -- Intervention Not Indicated -- --  Depression Interventions/Treatment  -- -- -- FUX3-2 Score <4 Follow-up Not Indicated  Financial  Strain Interventions Intervention Not Indicated -- Intervention Not Indicated --  Physical Activity Interventions -- -- Intervention Not Indicated --  Stress Interventions -- -- Intervention Not Indicated --  Social Connections Interventions -- -- Intervention Not Indicated --       Medication Assistance: None required.  Patient affirms current coverage meets needs.  Medication Access: Name and location of current pharmacy:  [id number], [place] - [address] AT [facility] [address] [place] [address] Phone: [phone] Fax: [phone]  [facility] - [place], [place] - [address] [place] [address] Phone: [phone] Fax: [phone]  Within the past 30 days, how often has patient missed a dose of medication? 0 Is a pillbox or other method used to improve adherence? No  Factors that may affect medication adherence? no barriers identified Are meds synced by current pharmacy? No  Are meds delivered by current pharmacy? Yes  Does patient experience delays in picking up medications due to transportation concerns? No   Compliance/Adherence/Medication fill history: Star Rating Drugs:  Medication:                Last Fill:         Day Supply Atorvastatin                 05/24/22-02/15/22           100ds Losartan                      07/06/22-06/15/22           90/30ds Metformin                    06/02/22                        90ds   Care Gaps: Annual wellness visit in last year? No, overdue since 08/04/22 Mammogram: Overdue since 11/14/21   Assessment/Plan   Hypertension (BP goal  <150 Systolic per Cardio March 2023) -Syncope 04/27/21 -Stroke Hx -Not ideally controlled -Current treatment: Amlodipine 5mg  Appropriate, Effective, Safe, Accessible Losartan 100mg  Appropriate, Effective, Safe, Accessible Metoprolol ER 50mg  BID Query Appropriate,  -Medications previously tried:   -Current home readings:  May 2024: (Don't have ability to check pulse) 08/08/22: 108/77  08/10/22: 111/67 08/11/22: 97/70, 103/70 08/12/22: 101/71 -Current dietary habits: "Tries to eat healthy" -Current exercise habits: None -Denies hypotensive/hypertensive symptoms -Educated on BP goals and benefits of medications for prevention of heart attack, stroke and kidney damage; -Counseled to monitor BP at home daily, document, and provide log at future appointments May 2024: With Hx of falls/Syncope and her low pulse readings from March visit with PCP, recommend Dc'ing or decreasing metoprolol ER BID. Her low pulse could also be the cause of her hypertension  Hyperlipidemia: (LDL goal < 100) The ASCVD Risk score (Arnett DK, et al., 2019) failed to calculate for the following reasons:   The 2019 ASCVD risk score is only valid for ages 21 to 51   The patient has a prior MI or stroke diagnosis Lab Results  Component Value Date   CHOL 134 01/12/2022   CHOL 122 06/04/2020   Lab Results  Component Value Date   HDL 71 01/12/2022   HDL 58 06/04/2020   Lab Results  Component Value Date   LDLCALC 47 01/12/2022   LDLCALC 47 06/04/2020  Lab Results  Component Value Date   TRIG 86 01/12/2022   TRIG 92 06/04/2020   Lab Results  Component Value Date   CHOLHDL 1.9 01/12/2022   CHOLHDL 2.1 06/04/2020   No results found for: "LDLDIRECT" Last vitamin D Lab Results  Component Value Date   VD25OH 35.8 05/31/2022   Lab Results  Component Value Date   TSH 1.830 01/12/2022   -Stroke Hx -Controlled -Current treatment: Atorvastatin 40mg  Appropriate, Effective, Safe, Accessible -Medications  previously tried: N/A  -Current dietary patterns: "Tries to eat healthy" -Current exercise habits: None -Educated on Cholesterol goals;  -Recommended to continue current medication  Osteoporosis / Osteopenia (Goal prevent fracture) -Controlled -Last DEXA Scan:   T-Score Dual Femur Neck Left:  03/25/22: -3.0 09/19/18: -2.8 04/18/14: -3.1 10/24/05: -2.5 03/03/03: -2.2 02/12/01: -2.3 T-Score Dual Femur Total Mean: 03/25/22: -2.2 09/19/18: -1.8 04/18/14: -2.2 10/24/05: -1.7 03/03/03: -1.6 02/12/01: -1.7 10-year probability of major osteoporotic fracture:  2024: 19% 10-year probability of hip fracture:  2024: 7.4% -Patient is a candidate for pharmacologic treatment due to T-Score < -2.5 in femoral neck -Current treatment  Calcium Carb Query Appropriate,  Alendronate 04/22/2014 per [employer] [facility] at [place], earliest fill was 2021 [facility] from [place], only goes back 2 years -Medications previously tried: N/A  -Recommend [id number] units of vitamin D daily. Recommend 1200 mg of calcium daily from dietary and supplemental sources. May 2024: Recommend Calcium Citrate with Pantoprazole  Thyroid (Goal TSH: 2-4.5 Lab Results  Component Value Date   TSH 1.830 01/12/2022  -Is patient taking Biotin supplement No  -Not ideally controlled -Current treatment: Levothyroxine Appropriate, Effective, Query Safe,  -Counseled to take medication on an empty stomach May 2024: With patient's Hx of Osteoporosis, recommend TSH goal of 2-4.5   CP F/U PRN  Artelia Laroche, Pharm.D. - [phone]

## 2022-08-17 NOTE — Addendum Note (Signed)
Addended by: Zettie Pho on: 08/17/2022 10:32 AM   Modules accepted: Orders

## 2022-08-17 NOTE — Patient Outreach (Signed)
Spoke with both Tammy and patient to decrease Metoprolol BID->QD per PCP  Updated medslist

## 2022-08-24 ENCOUNTER — Telehealth: Payer: Self-pay

## 2022-08-24 NOTE — Progress Notes (Unsigned)
Care Management & Coordination Services Pharmacy Team  Reason for Encounter: Hypertension  Contacted patient to discuss hypertension disease state. {US HC Outreach:28874}    Current antihypertensive regimen:  Amlodipine 5mg  daily  Losartan 100mg  daily  Metoprolol ER 50mg  daily  Patient verbally confirms she is taking the above medications as directed. {yes/no:20286}  How often are you checking your Blood Pressure? {CHL HP BP Monitoring Frequency:[phone]}  she checks her blood pressure {timing:25218} {before/after:25217} taking her medication.  Current home BP readings:    Wrist or arm cuff: Caffeine intake: Salt intake: OTC medications including pseudoephedrine or NSAIDs?  Any readings above 180/100? {yes/no:20286} If yes any symptoms of hypertensive emergency? {hypertensive emergency symptoms:25354}  What recent interventions/DTPs have been made by any provider to improve Blood Pressure control since last CPP Visit: ***  Any recent hospitalizations or ED visits since last visit with CPP? {yes/no:20286}  What diet changes have been made to improve Blood Pressure Control?  ***  What exercise is being done to improve your Blood Pressure Control?  ***  Adherence Review: Is the patient currently on ACE/ARB medication? Yes Does the patient have >5 day gap between last estimated fill dates? {yes/no:20286}  Star Rating Drugs:  Medication:  Last Fill: Day Supply Losartan   07/06/22-06/15/22 90/30ds Metformin   06/02/22  90ds Atorvastatin   05/24/22-02/15/22 100ds  Chart Updates: Recent office visits:  None  Recent consult visits:  None  Hospital visits:  None  Medications: Outpatient Encounter Medications as of 08/24/2022  Medication Sig   loratadine (CLARITIN) 10 MG tablet Take 1 tablet (10 mg total) by mouth daily.   alendronate (FOSAMAX) 70 MG tablet Take 1 tablet (70 mg total) by mouth once a week. Take with a full glass of water on an empty stomach.    amLODipine (NORVASC) 5 MG tablet Take 1 tablet (5 mg total) by mouth daily.   atorvastatin (LIPITOR) 40 MG tablet Take 1 tablet (40 mg total) by mouth daily.   calcium carbonate (OSCAL) 1500 (600 Ca) MG TABS tablet Take 600 mg of elemental calcium by mouth daily with breakfast.   clopidogrel (PLAVIX) 75 MG tablet Take 1 tablet (75 mg total) by mouth daily.   dorzolamide-timolol (COSOPT) 22.3-6.8 MG/ML ophthalmic solution Place 1 drop into both eyes 2 (two) times daily.    levothyroxine (SYNTHROID) 50 MCG tablet Take 1 tablet (50 mcg total) by mouth daily.   losartan (COZAAR) 100 MG tablet TAKE 1 TABLET(100 MG) BY MOUTH DAILY   metFORMIN (GLUCOPHAGE) 500 MG tablet TAKE 1 TABLET BY MOUTH DAILY  WITH BREAKFAST   metoprolol succinate (TOPROL-XL) 50 MG 24 hr tablet Take 50 mg by mouth daily. Take with or immediately following a meal.   Multiple Vitamins-Minerals (OCUVITE ADULT FORMULA PO) Take 1 tablet by mouth daily.   pantoprazole (PROTONIX) 40 MG tablet Take 1 tablet (40 mg total) by mouth daily.   ROCKLATAN 0.02-0.005 % SOLN Place 1 drop into both eyes at bedtime.   SSD 1 % cream Apply topically daily as needed (abrasions).   Facility-Administered Encounter Medications as of 08/24/2022  Medication   triamcinolone acetonide (KENALOG-40) injection 80 mg    Recent Office Vitals: BP Readings from Last 3 Encounters:  08/04/22 124/66  06/15/22 (!) 140/80  05/31/22 130/76   Pulse Readings from Last 3 Encounters:  08/04/22 65  06/15/22 68  05/31/22 71    Wt Readings from Last 3 Encounters:  05/31/22 131 lb (59.4 kg)  01/12/22 127 lb (57.6 kg)  12/07/21  127 lb (57.6 kg)     Kidney Function Lab Results  Component Value Date/Time   CREATININE 0.90 05/31/2022 11:51 AM   CREATININE 0.89 01/12/2022 11:32 AM   GFRNONAA >60 12/22/2020 03:24 AM   GFRAA 60 05/28/2019 11:09 AM       Latest Ref Rng & Units 05/31/2022   11:51 AM 01/12/2022   11:32 AM 03/24/2021    2:13 PM  BMP  Glucose 70 -  99 mg/dL 99  086  578   BUN 10 - 36 mg/dL 11  18  12    Creatinine 0.57 - 1.00 mg/dL 4.69  6.29  5.28   BUN/Creat Ratio 12 - 28 12  20  13    Sodium 134 - 144 mmol/L 137  134  136   Potassium 3.5 - 5.2 mmol/L 4.5  4.9  4.0   Chloride 96 - 106 mmol/L 99  96  102   CO2 20 - 29 mmol/L 23  23  23    Calcium 8.7 - 10.3 mg/dL 9.7  9.4  9.4      Roxana Hires, [facility] Clinical Pharmacist Assistant  [phone]

## 2022-08-25 ENCOUNTER — Other Ambulatory Visit: Payer: Self-pay | Admitting: Family Medicine

## 2022-08-25 DIAGNOSIS — I10 Essential (primary) hypertension: Secondary | ICD-10-CM

## 2022-08-31 ENCOUNTER — Telehealth: Payer: Self-pay

## 2022-08-31 NOTE — Telephone Encounter (Signed)
Patient's care giver Tammy has called the office stating that she has been trying to call you guys back and will be there till 92 but is only with patient from 9-12 during the day. Her call back number is [phone].  Thanks!

## 2022-08-31 NOTE — Telephone Encounter (Signed)
Patient's Care giver Tammy that stays with the patient during the day every day called and stated that the patient is no longer taking the Alendronate and that it was stopped in January. I confirmed this with patient's Dexa Scan results in her chart, that stated that recommendations per Dr. Sedalia Muta was that if patient has been on the Alendronate for more than 5 years, the patient would need to stop the medication for 1 year and start vitamin d with calcium per her note. Called and cancelled all refills that have been sent to [facility] mail in pharmacy for patient.

## 2022-08-31 NOTE — Telephone Encounter (Cosign Needed)
08/31/22- Called and left Tammy another VM to return my call with readings  Roxana Hires, CMA Clinical Pharmacist Assistant  [phone]

## 2022-09-01 NOTE — Telephone Encounter (Signed)
Patient's care giver Tammy called and left voicemail stating that she was told by Duwayne Heck from [facility] she needed to call us with the patient's BP readings Since they are no longer allowed to talk to our patient's. Patient's BP readings are as follows:  June 5th- 126/73 June 6th- 116/74 June 7th- 108/77 June 8th- 105/76 June 10th- 113/76 June 12th-  148/84 June 13th- 124/72

## 2022-09-02 NOTE — Telephone Encounter (Signed)
Patient's care giver Tammy informed, she states that she was confused because the pharmacist she was previously talking with about the BP readings stated that it needed to be in the 130's to 140's but she states she understands.

## 2022-09-14 ENCOUNTER — Other Ambulatory Visit: Payer: Self-pay

## 2022-09-14 MED ORDER — METFORMIN HCL 500 MG PO TABS: 500.0000 mg | ORAL_TABLET | Freq: Every day | ORAL | 0 refills | Status: DC

## 2022-09-19 ENCOUNTER — Other Ambulatory Visit: Payer: Self-pay

## 2022-09-19 MED ORDER — METFORMIN HCL 500 MG PO TABS: 500.0000 mg | ORAL_TABLET | Freq: Every day | ORAL | 0 refills | Status: DC

## 2022-09-19 MED ORDER — LORATADINE 10 MG PO TABS: 10.0000 mg | ORAL_TABLET | Freq: Every day | ORAL | 0 refills | Status: DC

## 2022-09-19 MED ORDER — AMLODIPINE BESYLATE 5 MG PO TABS: 5.0000 mg | ORAL_TABLET | Freq: Every day | ORAL | 0 refills | Status: DC

## 2022-09-19 MED ORDER — METOPROLOL SUCCINATE ER 50 MG PO TB24: 50.0000 mg | ORAL_TABLET | Freq: Every day | ORAL | 0 refills | Status: DC

## 2022-09-30 ENCOUNTER — Ambulatory Visit: Payer: Medicare Other | Admitting: Family Medicine

## 2022-10-21 ENCOUNTER — Other Ambulatory Visit: Payer: Self-pay | Admitting: Family Medicine

## 2022-10-21 ENCOUNTER — Encounter: Payer: Self-pay | Admitting: Family Medicine

## 2022-10-21 ENCOUNTER — Ambulatory Visit (INDEPENDENT_AMBULATORY_CARE_PROVIDER_SITE_OTHER): Payer: Medicare Other | Admitting: Family Medicine

## 2022-10-21 VITALS — BP 104/64 | HR 78 | Temp 97.2°F | Ht 60.0 in | Wt 133.0 lb

## 2022-10-21 DIAGNOSIS — I10 Essential (primary) hypertension: Secondary | ICD-10-CM

## 2022-10-21 DIAGNOSIS — E782 Mixed hyperlipidemia: Secondary | ICD-10-CM | POA: Diagnosis not present

## 2022-10-21 DIAGNOSIS — H548 Legal blindness, as defined in USA: Secondary | ICD-10-CM

## 2022-10-21 DIAGNOSIS — E118 Type 2 diabetes mellitus with unspecified complications: Secondary | ICD-10-CM

## 2022-10-21 DIAGNOSIS — Z89421 Acquired absence of other right toe(s): Secondary | ICD-10-CM

## 2022-10-21 DIAGNOSIS — R7303 Prediabetes: Secondary | ICD-10-CM | POA: Diagnosis not present

## 2022-10-21 DIAGNOSIS — J069 Acute upper respiratory infection, unspecified: Secondary | ICD-10-CM

## 2022-10-21 DIAGNOSIS — E039 Hypothyroidism, unspecified: Secondary | ICD-10-CM

## 2022-10-21 MED ORDER — LORATADINE 10 MG PO TABS: 10.0000 mg | ORAL_TABLET | Freq: Every day | ORAL | 1 refills | Status: DC

## 2022-10-21 MED ORDER — SSD 1 % EX CREA: TOPICAL_CREAM | Freq: Every day | CUTANEOUS | 1 refills | Status: DC | PRN

## 2022-10-21 MED ORDER — AMOXICILLIN 875 MG PO TABS
875.0000 mg | ORAL_TABLET | Freq: Two times a day (BID) | ORAL | 0 refills | Status: AC
Start: 2022-10-21 — End: 2022-10-31

## 2022-10-21 NOTE — Progress Notes (Unsigned)
Subjective:  Patient ID: Amber Brown, female    DOB: 1928-10-22  Age: 89 y.o. MRN: [id number]  Chief Complaint  Patient presents with   Medical Management of Chronic Issues    HPI   GERD: Not taking Protonix 40 mg daily  HTN: Metoprolol 50 mg daily, Losartan 100 mg daily, Norvasc 5 mg daily  Prediabetes: Metformin 500 mg daily Last A1c 6.3  Cholesterol: Lipitor 40 mg daily  Osteoporosis: On holiday from fosamax  Last AWV 08/03/21 Declined mammogram     10/21/2022    9:26 AM 05/31/2022   11:14 AM 01/12/2022    1:00 PM 08/03/2021   10:43 AM 06/04/2020    9:10 AM  Depression screen PHQ 2/9  Decreased Interest 0 0 0 0 0  Down, Depressed, Hopeless 0 0 0 0 0  PHQ - 2 Score 0 0 0 0 0  Altered sleeping     0  Tired, decreased energy     0  Change in appetite     0  Feeling bad or failure about yourself      0  Trouble concentrating     0  Moving slowly or fidgety/restless     0  Suicidal thoughts     0  PHQ-9 Score     0  Difficult doing work/chores     Not difficult at all        10/21/2022    9:25 AM  Fall Risk   Falls in the past year? 0  Number falls in past yr: 0  Injury with Fall? 0  Follow up Falls evaluation completed    Patient Care Team: Blane Ohara, MD as PCP - General (Family Medicine)   Review of Systems  Constitutional:  Negative for chills, fatigue and fever.  HENT:  Negative for congestion, ear pain, rhinorrhea and sore throat.   Respiratory:  Negative for cough and shortness of breath.   Cardiovascular:  Negative for chest pain.  Gastrointestinal:  Negative for abdominal pain, constipation, diarrhea, nausea and vomiting.  Genitourinary:  Negative for dysuria and urgency.  Musculoskeletal:  Negative for back pain and myalgias.  Neurological:  Negative for dizziness, weakness, light-headedness and headaches.  Psychiatric/Behavioral:  Negative for dysphoric mood. The patient is not nervous/anxious.     Current Outpatient Medications on File Prior  to Visit  Medication Sig Dispense Refill   amLODipine (NORVASC) 5 MG tablet Take 1 tablet (5 mg total) by mouth daily. 90 tablet 0   atorvastatin (LIPITOR) 40 MG tablet Take 1 tablet (40 mg total) by mouth daily. 100 tablet 2   clopidogrel (PLAVIX) 75 MG tablet Take 1 tablet (75 mg total) by mouth daily. 100 tablet 2   dorzolamide-timolol (COSOPT) 22.3-6.8 MG/ML ophthalmic solution Place 1 drop into both eyes 2 (two) times daily.      levothyroxine (SYNTHROID) 50 MCG tablet Take 1 tablet (50 mcg total) by mouth daily. 100 tablet 2   losartan (COZAAR) 100 MG tablet TAKE 1 TABLET(100 MG) BY MOUTH DAILY 90 tablet 0   metFORMIN (GLUCOPHAGE) 500 MG tablet Take 1 tablet (500 mg total) by mouth daily with breakfast. 90 tablet 0   Multiple Vitamins-Minerals (OCUVITE ADULT FORMULA PO) Take 1 tablet by mouth daily.     ROCKLATAN 0.02-0.005 % SOLN Place 1 drop into both eyes at bedtime.     pantoprazole (PROTONIX) 40 MG tablet Take 1 tablet (40 mg total) by mouth daily. (Patient not taking: Reported on 10/21/2022) 100 tablet 1  No current facility-administered medications on file prior to visit.   Past Medical History:  Diagnosis Date   Arthritis    Degenerative scoliosis in adult patient 01/06/2015   GERD with esophagitis 05/28/2019   Glaucoma 02/20/2017   History of kidney stones    Hypertension 02/20/2017   Hypothyroidism, adult 05/28/2019   Mixed hyperlipidemia 05/28/2019   Osteoporosis of forearm without pathological fracture 05/28/2019   Peripheral vascular disease (HCC) 05/28/2019   Sequelae of cerebral infarction 05/28/2019   Stroke [facility])    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   Past Surgical History:  Procedure Laterality Date   ABDOMINAL HYSTERECTOMY     CHOLECYSTECTOMY     TOTAL KNEE ARTHROPLASTY Right 12/21/2020   Procedure: TOTAL KNEE ARTHROPLASTY;  Surgeon: Dannielle Huh, MD;  Location: [facility] ORS;  Service: Orthopedics;  Laterality: Right;    Family History  Problem Relation Age of  Onset   Aneurysm Mother    Bone cancer Father    Heart Problems Brother    Alcohol abuse Brother    Lung cancer Brother    Social History   Socioeconomic History   Marital status: Widowed    Spouse name: Not on file   Number of children: Not on file   Years of education: Not on file   Highest education level: Not on file  Occupational History   Not on file  Tobacco Use   Smoking status: Never   Smokeless tobacco: Never  Vaping Use   Vaping status: Never Used  Substance and Sexual Activity   Alcohol use: Never   Drug use: Never   Sexual activity: Not Currently  Other Topics Concern   Not on file  Social History Narrative   Not on file   Social Determinants of Health   Financial Resource Strain: Low Risk  (08/12/2022)   Overall Financial Resource Strain (CARDIA)    Difficulty of Paying Living Expenses: Not hard at all  Food Insecurity: No Food Insecurity (08/03/2021)   Hunger Vital Sign    Worried About Running Out of Food in the Last Year: Never true    Ran Out of Food in the Last Year: Never true  Transportation Needs: No Transportation Needs (08/12/2022)   PRAPARE - Administrator, Civil Service (Medical): No    Lack of Transportation (Non-Medical): No  Physical Activity: Inactive (08/03/2021)   Exercise Vital Sign    Days of Exercise per Week: 0 days    Minutes of Exercise per Session: 0 min  Stress: No Stress Concern Present (08/03/2021)   [employer] of Occupational Health - Occupational Stress Questionnaire    Feeling of Stress : Not at all  Social Connections: Moderately Isolated (08/03/2021)   Social Connection and Isolation Panel [NHANES]    Frequency of Communication with Friends and Family: More than three times a week    Frequency of Social Gatherings with Friends and Family: Twice a week    Attends Religious Services: More than 4 times per year    Active Member of [employer] or Organizations: No    Attends Banker Meetings: Never     Marital Status: Widowed    Objective:  BP 104/64   Pulse 78   Temp (!) 97.2 F (36.2 C)   Ht 5' (1.524 m)   Wt 133 lb (60.3 kg)   LMP  (LMP Unknown)   SpO2 100%   BMI 25.97 kg/m      10/21/2022    9:04 AM  08/04/2022    3:26 PM 06/15/2022   12:01 PM  BP/Weight  Systolic BP 104 124 140  Diastolic BP 64 66 80  Wt. (Lbs) 133    BMI 25.97 kg/m2      Physical Exam Vitals reviewed.  Constitutional:      Appearance: Normal appearance. She is normal weight.  HENT:     Nose:     Right Turbinates: Swollen.     Left Turbinates: Swollen.     Mouth/Throat:     Pharynx: Posterior oropharyngeal erythema present.  Neck:     Vascular: No carotid bruit.     Comments: Swollen lymph node left anterior cervical Cardiovascular:     Rate and Rhythm: Normal rate and regular rhythm.     Heart sounds: Normal heart sounds.  Pulmonary:     Effort: Pulmonary effort is normal. No respiratory distress.     Breath sounds: Normal breath sounds.  Abdominal:     General: Abdomen is flat. Bowel sounds are normal.     Palpations: Abdomen is soft.     Tenderness: There is no abdominal tenderness.  Musculoskeletal:     Right lower leg: Edema present.     Left lower leg: Edema present.  Neurological:     Mental Status: She is alert and oriented to person, place, and time.  Psychiatric:        Mood and Affect: Mood normal.        Behavior: Behavior normal.     Diabetic Foot Exam - Simple   Simple Foot Form Diabetic Foot exam was performed with the following findings: Yes 10/21/2022  9:26 AM  Visual Inspection See comments: Yes Sensation Testing Intact to touch and monofilament testing bilaterally: Yes Pulse Check Posterior Tibialis and Dorsalis pulse intact bilaterally: Yes Comments Bunion on left toe Right foot amputation of third toe      Lab Results  Component Value Date   WBC 10.4 10/21/2022   HGB 12.9 10/21/2022   HCT 40.3 10/21/2022   PLT 349 10/21/2022   GLUCOSE 108 (H)  10/21/2022   CHOL 124 10/21/2022   TRIG 93 10/21/2022   HDL 60 10/21/2022   LDLCALC 46 10/21/2022   ALT 10 10/21/2022   AST 16 10/21/2022   NA 135 10/21/2022   K 4.9 10/21/2022   CL 100 10/21/2022   CREATININE 0.87 10/21/2022   BUN 15 10/21/2022   CO2 21 10/21/2022   TSH 1.830 01/12/2022   HGBA1C 6.0 (H) 10/21/2022      Assessment & Plan:    Primary hypertension Assessment & Plan: Controlled Stop norvasc due to swelling. Continue other medications  Orders: -     Comprehensive metabolic panel  Mixed hyperlipidemia Assessment & Plan: Well controlled.  No changes to medicines. Continue lipitor 40 mg before bed.  Continue to work on eating a healthy diet and exercise.  Labs drawn today.    Orders: -     Lipid panel  Hypothyroidism, adult Assessment & Plan: Previously well controlled Continue Synthroid at current dose      Legally blind  History of amputation of lesser toe of right foot (HCC)  Upper respiratory tract infection, unspecified type Assessment & Plan: Will give course of amoxicillin since she has been sick for 2 days.  Orders: -     Loratadine; Take 1 tablet (10 mg total) by mouth daily.  Dispense: 90 tablet; Refill: 1 -     Amoxicillin; Take 1 tablet (875 mg total) by mouth 2 (  two) times daily for 10 days.  Dispense: 20 tablet; Refill: 0  Controlled type 2 diabetes mellitus with complication, without long-term current use of insulin (HCC) Assessment & Plan: Hemoglobin A1c 6.3, 3 month avg of blood sugars, is in prediabetic range.  In order to prevent progression to diabetes, recommend low carb diet and regular exercise. Discontinue metformin due to age.  Orders: -     CBC with Differential/Platelet -     Hemoglobin A1c -     Microalbumin / creatinine urine ratio  Other orders -     SSD; Apply topically daily as needed (abrasions).  Dispense: 50 g; Refill: 1     Meds ordered this encounter  Medications   loratadine (CLARITIN) 10 MG  tablet    Sig: Take 1 tablet (10 mg total) by mouth daily.    Dispense:  90 tablet    Refill:  1   SSD 1 % cream    Sig: Apply topically daily as needed (abrasions).    Dispense:  50 g    Refill:  1   amoxicillin (AMOXIL) 875 MG tablet    Sig: Take 1 tablet (875 mg total) by mouth 2 (two) times daily for 10 days.    Dispense:  20 tablet    Refill:  0    Orders Placed This Encounter  Procedures   CBC with Differential/Platelet   Comprehensive metabolic panel   Lipid panel   Hemoglobin A1c   Microalbumin / creatinine urine ratio     Follow-up: Return in about 3 months (around 01/21/2023) for chronic.   I,Katherina A Bramblett,acting as a scribe for Blane Ohara, MD.,have documented all relevant documentation on the behalf of Blane Ohara, MD,as directed by  Blane Ohara, MD while in the presence of Blane Ohara, MD.   An After Visit Summary was printed and given to the patient.  Blane Ohara, MD  Family Practice [phone]

## 2022-10-21 NOTE — Assessment & Plan Note (Signed)
Previously well controlled Continue Synthroid at current dose  

## 2022-10-21 NOTE — Patient Instructions (Addendum)
Keep legs elevated and compression stockings Stop amlodipine.  Stop metformin.

## 2022-10-21 NOTE — Assessment & Plan Note (Signed)
Hemoglobin A1c 6.3, 3 month avg of blood sugars, is in prediabetic range.  In order to prevent progression to diabetes, recommend low carb diet and regular exercise. Discontinue metformin.

## 2022-10-21 NOTE — Assessment & Plan Note (Signed)
Well controlled.  No changes to medicines.  Continue lipitor 40 mg before bed.  Continue to work on eating a healthy diet and exercise.  Labs drawn today.   

## 2022-10-21 NOTE — Assessment & Plan Note (Addendum)
Controlled Stop norvasc due to swelling. Continue other medications

## 2022-10-23 DIAGNOSIS — E118 Type 2 diabetes mellitus with unspecified complications: Secondary | ICD-10-CM | POA: Insufficient documentation

## 2022-10-23 DIAGNOSIS — J069 Acute upper respiratory infection, unspecified: Secondary | ICD-10-CM | POA: Insufficient documentation

## 2022-10-23 NOTE — Assessment & Plan Note (Addendum)
Hemoglobin A1c 6.3, 3 month avg of blood sugars, is in prediabetic range.  In order to prevent progression to diabetes, recommend low carb diet and regular exercise. Discontinue metformin due to age.

## 2022-10-23 NOTE — Assessment & Plan Note (Signed)
Will give course of amoxicillin since she has been sick for 2 days.

## 2022-10-31 DIAGNOSIS — W2209XA Striking against other stationary object, initial encounter: Secondary | ICD-10-CM | POA: Diagnosis not present

## 2022-10-31 DIAGNOSIS — S0181XA Laceration without foreign body of other part of head, initial encounter: Secondary | ICD-10-CM | POA: Diagnosis not present

## 2022-11-04 ENCOUNTER — Other Ambulatory Visit: Payer: Self-pay | Admitting: Family Medicine

## 2022-11-04 DIAGNOSIS — E039 Hypothyroidism, unspecified: Secondary | ICD-10-CM

## 2022-11-04 DIAGNOSIS — I739 Peripheral vascular disease, unspecified: Secondary | ICD-10-CM

## 2022-11-08 ENCOUNTER — Telehealth: Payer: Self-pay

## 2022-11-08 NOTE — Telephone Encounter (Signed)
Transition Care Management Unsuccessful Follow-up Telephone Call  Date of discharge and from where:  [facility] 8/12  Attempts:  1st Attempt  Reason for unsuccessful TCM follow-up call:  No answer/busy   Lenard Forth [facility] Guide, [facility] [phone] [address] [place], [place], [place] [address] Phone: [phone] Email: [email or web address]

## 2022-11-08 NOTE — Telephone Encounter (Signed)
Transition Care Management Follow-up Telephone Call Date of discharge and from where: [facility] 8/12 How have you been since you were released from the hospital? Doing ok but having difficulties getting medications filled since changing doctors Any questions or concerns? Yes getting medications filled on time before she runs out  Items Reviewed: Did the pt receive and understand the discharge instructions provided? Yes  Medications obtained and verified? Yes  Other? No  Any new allergies since your discharge? No  Dietary orders reviewed? No Do you have support at home? Yes     Follow up appointments reviewed:  PCP Hospital f/u appt confirmed? Yes  Scheduled to see PCP on  @ . Specialist Hospital f/u appt confirmed? No  Scheduled to see  on  @ . Are transportation arrangements needed? No If their condition worsens, is the pt aware to call PCP or go to the Emergency Dept.? Yes Was the patient provided with contact information for the PCP's office or ED? Yes Was to pt encouraged to call back with questions or concerns? Yes

## 2022-11-09 ENCOUNTER — Other Ambulatory Visit: Payer: Self-pay | Admitting: Family Medicine

## 2022-11-09 ENCOUNTER — Telehealth: Payer: Self-pay

## 2022-11-09 MED ORDER — AZITHROMYCIN 250 MG PO TABS: ORAL_TABLET | ORAL | 0 refills | Status: DC

## 2022-11-09 NOTE — Telephone Encounter (Signed)
Patient's care taker called stating that the patient was seen a few weeks ago for her upper respiratory infection that she was having, and states that she has finished the antibiotics, but she is still coughing and complains that her throat still hurts and believes she needs another round of antibiotics. Please advise.  Phone number to call back: [phone]

## 2022-11-10 ENCOUNTER — Other Ambulatory Visit: Payer: Self-pay | Admitting: Family Medicine

## 2022-11-10 NOTE — Telephone Encounter (Signed)
Left detailed message informing care taker.

## 2022-11-13 ENCOUNTER — Other Ambulatory Visit: Payer: Self-pay | Admitting: Family Medicine

## 2022-11-14 ENCOUNTER — Telehealth: Payer: Self-pay

## 2022-11-14 NOTE — Telephone Encounter (Signed)
The pharmacy called for approval for change in manufacturers on patient's levothyroxine.  Approval was given for ne manufacturer.

## 2022-11-18 ENCOUNTER — Other Ambulatory Visit: Payer: Self-pay

## 2022-11-19 MED ORDER — METOPROLOL SUCCINATE ER 50 MG PO TB24: 50.0000 mg | ORAL_TABLET | Freq: Every day | ORAL | 1 refills | Status: DC

## 2022-11-24 ENCOUNTER — Ambulatory Visit (INDEPENDENT_AMBULATORY_CARE_PROVIDER_SITE_OTHER): Payer: Medicare Other

## 2022-11-24 VITALS — BP 122/78 | HR 73 | Resp 16 | Ht 60.0 in | Wt 133.0 lb

## 2022-11-24 DIAGNOSIS — Z0000 Encounter for general adult medical examination without abnormal findings: Secondary | ICD-10-CM

## 2022-11-24 NOTE — Progress Notes (Signed)
Subjective:   ILZE GERDEMAN is a 89 y.o. female who presents for Medicare Annual (Subsequent) preventive examination.  This wellness visit is conducted by a nurse.  The patient's medications were reviewed and reconciled since the patient's last visit.  History details were provided by the patient.  The history appears to be reliable.    Medical History: Patient history and Family history was reviewed  Medications, Allergies, and preventative health maintenance was reviewed and updated.   Visit Complete: In person  Cardiac Risk Factors include: advanced age (>48men, >74 women);diabetes mellitus     Objective:    Today's Vitals   11/24/22 0900  BP: 122/78  Pulse: 73  Resp: 16  SpO2: 95%  Weight: 133 lb (60.3 kg)  Height: 5' (1.524 m)  PainSc: 0-No pain   Body mass index is 25.97 kg/m.     08/03/2021   10:46 AM 12/21/2020    7:08 AM 12/15/2020    2:13 PM  Advanced Directives  Does Patient Have a Medical Advance Directive? Yes No Yes  Type of Advance Directive Living will;Healthcare Power of [employer] Power of Attorney  Does patient want to make changes to medical advance directive? No - Patient declined    Would patient like information on creating a medical advance directive?  No - Patient declined     Current Medications (verified) Outpatient Encounter Medications as of 11/24/2022  Medication Sig   amLODipine (NORVASC) 5 MG tablet Take 1 tablet (5 mg total) by mouth daily.   atorvastatin (LIPITOR) 40 MG tablet TAKE 1 TABLET BY MOUTH DAILY   clopidogrel (PLAVIX) 75 MG tablet TAKE 1 TABLET BY MOUTH DAILY   dorzolamide-timolol (COSOPT) 22.3-6.8 MG/ML ophthalmic solution Place 1 drop into both eyes 2 (two) times daily.    levothyroxine (SYNTHROID) 50 MCG tablet TAKE 1 TABLET BY MOUTH DAILY   loratadine (CLARITIN) 10 MG tablet Take 1 tablet (10 mg total) by mouth daily.   losartan (COZAAR) 100 MG tablet TAKE 1 TABLET BY MOUTH DAILY   metoprolol succinate  (TOPROL-XL) 50 MG 24 hr tablet Take 1 tablet (50 mg total) by mouth daily. Take with or immediately following a meal.   Multiple Vitamins-Minerals (OCUVITE ADULT FORMULA PO) Take 1 tablet by mouth daily.   pantoprazole (PROTONIX) 40 MG tablet TAKE 1 TABLET BY MOUTH DAILY   ROCKLATAN 0.02-0.005 % SOLN Place 1 drop into both eyes at bedtime.   SSD 1 % cream Apply topically daily as needed (abrasions).   [DISCONTINUED] azithromycin (ZITHROMAX) 250 MG tablet 2 DAILY FOR FIRST DAY, THEN DECREASE TO ONE DAILY FOR 4 MORE DAYS.   [DISCONTINUED] metFORMIN (GLUCOPHAGE) 500 MG tablet Take 1 tablet (500 mg total) by mouth daily with breakfast.   No facility-administered encounter medications on file as of 11/24/2022.    Allergies (verified) Ambien [zolpidem] and Meperidine   History: Past Medical History:  Diagnosis Date   Arthritis    Degenerative scoliosis in adult patient 01/06/2015   GERD with esophagitis 05/28/2019   Glaucoma 02/20/2017   History of kidney stones    Hypertension 02/20/2017   Hypothyroidism, adult 05/28/2019   Mixed hyperlipidemia 05/28/2019   Osteoporosis of forearm without pathological fracture 05/28/2019   Peripheral vascular disease (HCC) 05/28/2019   Sequelae of cerebral infarction 05/28/2019   Stroke [facility])    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   Past Surgical History:  Procedure Laterality Date   ABDOMINAL HYSTERECTOMY     CHOLECYSTECTOMY  TOTAL KNEE ARTHROPLASTY Right 12/21/2020   Procedure: TOTAL KNEE ARTHROPLASTY;  Surgeon: Dannielle Huh, MD;  Location: [facility];  Service: Orthopedics;  Laterality: Right;   Family History  Problem Relation Age of Onset   Aneurysm Mother    Bone cancer Father    Heart Problems Brother    Alcohol abuse Brother    Lung cancer Brother    Social History   Socioeconomic History   Marital status: Widowed    Spouse name: Not on file   Number of children: Not on file   Years of education: Not on file   Highest education level:  Not on file  Occupational History   Not on file  Tobacco Use   Smoking status: Never   Smokeless tobacco: Never  Vaping Use   Vaping status: Never Used  Substance and Sexual Activity   Alcohol use: Never   Drug use: Never   Sexual activity: Not Currently  Other Topics Concern   Not on file  Social History Narrative   Not on file   Social Determinants of Health   Financial Resource Strain: Low Risk  (11/24/2022)   Overall Financial Resource Strain (CARDIA)    Difficulty of Paying Living Expenses: Not hard at all  Food Insecurity: No Food Insecurity (11/24/2022)   Hunger Vital Sign    Worried About Running Out of Food in the Last Year: Never true    Ran Out of Food in the Last Year: Never true  Transportation Needs: No Transportation Needs (11/24/2022)   PRAPARE - Administrator, Civil Service (Medical): No    Lack of Transportation (Non-Medical): No  Physical Activity: Inactive (11/24/2022)   Exercise Vital Sign    Days of Exercise per Week: 0 days    Minutes of Exercise per Session: 0 min  Stress: No Stress Concern Present (11/24/2022)   [employer] of Occupational Health - Occupational Stress Questionnaire    Feeling of Stress : Not at all  Social Connections: Moderately Isolated (11/24/2022)   Social Connection and Isolation Panel [NHANES]    Frequency of Communication with Friends and Family: More than three times a week    Frequency of Social Gatherings with Friends and Family: Twice a week    Attends Religious Services: More than 4 times per year    Active Member of [employer] or Organizations: No    Attends Banker Meetings: Never    Marital Status: Widowed    Tobacco Counseling Counseling given: Not Answered   Clinical Intake:  Pre-visit preparation completed: Yes  Pain : No/denies pain Pain Score: 0-No pain     BMI - recorded: 25.97 Nutritional Status: BMI 25 -29 Overweight Nutritional Risks: None Diabetes: Yes (Most recent A1C  6.0) CBG done?: No  How often do you need to have someone help you when you read instructions, pamphlets, or other written materials from your doctor or pharmacy?: 2 - Rarely  Interpreter Needed?: No      Activities of Daily Living    11/24/2022    9:06 AM  In your present state of health, do you have any difficulty performing the following activities:  Hearing? 1  Vision? 1  Difficulty concentrating or making decisions? 0  Walking or climbing stairs? 1  Dressing or bathing? 0  Doing errands, shopping? 1  Preparing Food and eating ? N  Using the Toilet? N  In the past six months, have you accidently leaked urine? Y  Do you have problems with  loss of bowel control? N  Managing your Medications? Y  Managing your Finances? N  Housekeeping or managing your Housekeeping? Y    Patient Care Team: Blane Ohara, MD as PCP - General (Family Medicine)  Indicate any recent Medical Services you may have received from other than [facility] providers in the past year (date may be approximate).     Assessment:   This is a routine wellness examination for [facility].  Hearing/Vision screen No results found.   Goals Addressed             This Visit's Progress    Prevent falls       11/24/2022 AWV Goal: Fall Prevention  Over the next year, patient will decrease their risk for falls by: Using assistive devices, such as a cane or walker, as needed Identifying fall risks within their home and correcting them by: Removing throw rugs Adding handrails to stairs or ramps Removing clutter and keeping a clear pathway throughout the home Increasing light, especially at night Adding shower handles/bars Raising toilet seat Identifying potential personal risk factors for falls: Medication side effects Incontinence/urgency Vestibular dysfunction Hearing loss Musculoskeletal disorders Neurological disorders Orthostatic hypotension         Depression Screen    11/24/2022    9:19 AM  10/21/2022    9:26 AM 05/31/2022   11:14 AM 01/12/2022    1:00 PM 08/03/2021   10:43 AM 06/04/2020    9:10 AM 06/04/2020    9:03 AM  PHQ 2/9 Scores  PHQ - 2 Score 0 0 0 0 0 0 0  PHQ- 9 Score      0     Fall Risk    11/24/2022    9:25 AM 10/21/2022    9:25 AM 05/31/2022   11:14 AM 08/03/2021   10:46 AM 06/04/2020    9:03 AM  Fall Risk   Falls in the past year? 1 0 0 1 0  Number falls in past yr: 0 0 0 0 0  Injury with Fall? 0 0 0 0 0  Risk for fall due to : Impaired balance/gait;Impaired vision  Impaired vision Impaired balance/gait   Follow up Falls evaluation completed;Falls prevention discussed Falls evaluation completed Falls evaluation completed Falls prevention discussed Falls evaluation completed    MEDICARE RISK AT HOME: Medicare Risk at Home Any stairs in or around the home?: Yes If so, are there any without handrails?: No Home free of loose throw rugs in walkways, pet beds, electrical cords, etc?: Yes Adequate lighting in your home to reduce risk of falls?: Yes Life alert?: No Use of a cane, walker or w/c?: Yes Grab bars in the bathroom?: Yes Shower chair or bench in shower?: No Elevated toilet seat or a handicapped toilet?: No  TIMED UP AND GO:  Was the test performed?  Yes  Length of time to ambulate 10 feet: 13 sec Gait slow and steady with assistive device    Cognitive Function:    06/04/2020    9:44 AM  MMSE - Mini Mental State Exam  Orientation to time 5  Orientation to Place 5  Registration 3  Attention/ Calculation 5  Recall 0  Language- name 2 objects 2  Language- repeat 1  Language- follow 3 step command 3  Language- read & follow direction 0  Write a sentence 1  Copy design 0  Total score 25        11/24/2022    9:25 AM 08/03/2021   10:47 AM  6CIT  Screen  What Year? 0 points 0 points  What month? 0 points 0 points  What time? 0 points 0 points  Count back from 20 0 points 0 points  Months in reverse 2 points 0 points  Repeat phrase 0 points  0 points  Total Score 2 points 0 points    Immunizations Immunization History  Administered Date(s) Administered   Fluad Quad(high Dose 65+) 01/12/2022   Influenza-Unspecified 01/27/2020   PFIZER(Purple Top)SARS-COV-2 Vaccination 05/13/2019, 06/03/2019   Pneumococcal Conjugate-13 12/11/2017   Pneumococcal Polysaccharide-23 12/22/2010   Zoster Recombinant(Shingrix) 01/10/2018, 04/26/2018    TDAP status: Due, Education has been provided regarding the importance of this vaccine. Advised may receive this vaccine at local pharmacy or Health Dept. Aware to provide a copy of the vaccination record if obtained from local pharmacy or Health Dept. Verbalized acceptance and understanding.  Flu Vaccine status: Due, Education has been provided regarding the importance of this vaccine. Advised may receive this vaccine at local pharmacy or Health Dept. Aware to provide a copy of the vaccination record if obtained from local pharmacy or Health Dept. Verbalized acceptance and understanding.  Pneumococcal vaccine status: Up to date  Covid-19 vaccine status: Declined, Education has been provided regarding the importance of this vaccine but patient still declined. Advised may receive this vaccine at local pharmacy or Health Dept.or vaccine clinic. Aware to provide a copy of the vaccination record if obtained from local pharmacy or Health Dept. Verbalized acceptance and understanding.  Qualifies for Shingles Vaccine? Yes   Zostavax completed No   Shingrix Completed?: No.    Education has been provided regarding the importance of this vaccine. Patient has been advised to call insurance company to determine out of pocket expense if they have not yet received this vaccine. Advised may also receive vaccine at local pharmacy or Health Dept. Verbalized acceptance and understanding.  Screening Tests Health Maintenance  Topic Date Due   OPHTHALMOLOGY EXAM  Never done   DTaP/Tdap/Td (1 - Tdap) Never done    COVID-19 Vaccine (3 - Pfizer risk series) 07/01/2019   Medicare Annual Wellness (AWV)  08/04/2022   INFLUENZA VACCINE  10/20/2022   HEMOGLOBIN A1C  04/23/2023   FOOT EXAM  10/21/2023   Pneumonia Vaccine 57+ Years old  Completed   DEXA SCAN  Completed   Zoster Vaccines- Shingrix  Completed   HPV VACCINES  Aged Out    Health Maintenance  Health Maintenance Due  Topic Date Due   OPHTHALMOLOGY EXAM  Never done   DTaP/Tdap/Td (1 - Tdap) Never done   COVID-19 Vaccine (3 - Pfizer risk series) 07/01/2019   Medicare Annual Wellness (AWV)  08/04/2022   INFLUENZA VACCINE  10/20/2022    Colorectal cancer screening: No longer required.   Mammogram status: No longer required due to age.  Bone Density status: Completed 03/2022. Results reflect: Bone density results: OSTEOPOROSIS. Repeat every 2 years.  Lung Cancer Screening: (Low Dose CT Chest recommended if Age 33-80 years, 20 pack-year currently smoking OR have quit w/in 15years.) does not qualify.   Lung Cancer Screening Referral: N/A  Additional Screening:  Vision Screening: Recommended annual ophthalmology exams for early detection of glaucoma and other disorders of the eye. Is the patient up to date with their annual eye exam?  Yes  Who is the provider or what is the name of the office in which the patient attends annual eye exams?  Eye  Dental Screening: Recommended annual dental exams for proper oral hygiene  Community Resource Referral /  Chronic Care Management: CRR required this visit?  No   CCM required this visit?  No     Plan:    1- Records requested for diabetic eye exam at [facility]  I have personally reviewed and noted the following in the patient's chart:   Medical and social history Use of alcohol, tobacco or illicit drugs  Current medications and supplements including opioid prescriptions. Patient is not currently taking opioid prescriptions. Functional ability and status Nutritional  status Physical activity Advanced directives List of other physicians Hospitalizations, surgeries, and ER visits in previous 12 months Vitals Screenings to include cognitive, depression, and falls Referrals and appointments  In addition, I have reviewed and discussed with patient certain preventive protocols, quality metrics, and best practice recommendations. A written personalized care plan for preventive services as well as general preventive health recommendations were provided to patient.     Jacklynn Bue, LPN   0/11/8117

## 2022-11-24 NOTE — Patient Instructions (Signed)
Ms. Amber Brown , Thank you for taking time to come for your Medicare Wellness Visit. I appreciate your ongoing commitment to your health goals. Please review the following plan we discussed and let me know if I can assist you in the future.   These are the goals we discussed:  Goals      Prevent falls     11/24/2022 AWV Goal: Fall Prevention  Over the next year, patient will decrease their risk for falls by: Using assistive devices, such as a cane or walker, as needed Identifying fall risks within their home and correcting them by: Removing throw rugs Adding handrails to stairs or ramps Removing clutter and keeping a clear pathway throughout the home Increasing light, especially at night Adding shower handles/bars Raising toilet seat Identifying potential personal risk factors for falls: Medication side effects Incontinence/urgency Vestibular dysfunction Hearing loss Musculoskeletal disorders Neurological disorders Orthostatic hypotension          This is a list of the screening recommended for you and due dates:  Health Maintenance  Topic Date Due   Eye exam for diabetics  Never done   DTaP/Tdap/Td vaccine (1 - Tdap) Never done   COVID-19 Vaccine (3 - Pfizer risk series) 07/01/2019   Flu Shot  12/24/2022*   Hemoglobin A1C  04/23/2023   Complete foot exam   10/21/2023   Medicare Annual Wellness Visit  11/24/2023   Pneumonia Vaccine  Completed   DEXA scan (bone density measurement)  Completed   Zoster (Shingles) Vaccine  Completed   HPV Vaccine  Aged Out  *Topic was postponed. The date shown is not the original due date.    Preventive Care 30 Years and Older, Female Preventive care refers to lifestyle choices and visits with your health care provider that can promote health and wellness. What does preventive care include? A yearly physical exam. This is also called an annual well check. Dental exams once or twice a year. Routine eye exams. Ask your health care provider  how often you should have your eyes checked. Personal lifestyle choices, including: Daily care of your teeth and gums. Regular physical activity. Eating a healthy diet. Avoiding tobacco and drug use. Limiting alcohol use. Practicing safe sex. Taking low-dose aspirin every day. Taking vitamin and mineral supplements as recommended by your health care provider. What happens during an annual well check? The services and screenings done by your health care provider during your annual well check will depend on your age, overall health, lifestyle risk factors, and family history of disease. Counseling  Your health care provider may ask you questions about your: Alcohol use. Tobacco use. Drug use. Emotional well-being. Home and relationship well-being. Sexual activity. Eating habits. History of falls. Memory and ability to understand (cognition). Work and work Astronomer. Reproductive health. Screening  You may have the following tests or measurements: Height, weight, and BMI. Blood pressure. Lipid and cholesterol levels. These may be checked every 5 years, or more frequently if you are over 6 years old. Skin check. Lung cancer screening. You may have this screening every year starting at age 40 if you have a 30-pack-year history of smoking and currently smoke or have quit within the past 15 years. Fecal occult blood test (FOBT) of the stool. You may have this test every year starting at age 68. Flexible sigmoidoscopy or colonoscopy. You may have a sigmoidoscopy every 5 years or a colonoscopy every 10 years starting at age 51. Hepatitis C blood test. Hepatitis B blood test. Sexually transmitted  disease (STD) testing. Diabetes screening. This is done by checking your blood sugar (glucose) after you have not eaten for a while (fasting). You may have this done every 1-3 years. Bone density scan. This is done to screen for osteoporosis. You may have this done starting at age  73. Mammogram. This may be done every 1-2 years. Talk to your health care provider about how often you should have regular mammograms. Talk with your health care provider about your test results, treatment options, and if necessary, the need for more tests. Vaccines  Your health care provider may recommend certain vaccines, such as: Influenza vaccine. This is recommended every year. Tetanus, diphtheria, and acellular pertussis (Tdap, Td) vaccine. You may need a Td booster every 10 years. Zoster vaccine. You may need this after age 8. Pneumococcal 13-valent conjugate (PCV13) vaccine. One dose is recommended after age 13. Pneumococcal polysaccharide (PPSV23) vaccine. One dose is recommended after age 9. Talk to your health care provider about which screenings and vaccines you need and how often you need them. This information is not intended to replace advice given to you by your health care provider. Make sure you discuss any questions you have with your health care provider. Document Released: 04/03/2015 Document Revised: 11/25/2015 Document Reviewed: 01/06/2015 Elsevier Interactive Patient Education  2017 [employer].  Fall Prevention in the Home Falls can cause injuries. They can happen to people of all ages. There are many things you can do to make your home safe and to help prevent falls. What can I do on the outside of my home? Regularly fix the edges of walkways and driveways and fix any cracks. Remove anything that might make you trip as you walk through a door, such as a raised step or threshold. Trim any bushes or trees on the path to your home. Use bright outdoor lighting. Clear any walking paths of anything that might make someone trip, such as rocks or tools. Regularly check to see if handrails are loose or broken. Make sure that both sides of any steps have handrails. Any raised decks and porches should have guardrails on the edges. Have any leaves, snow, or ice cleared  regularly. Use sand or salt on walking paths during winter. Clean up any spills in your garage right away. This includes oil or grease spills. What can I do in the bathroom? Use night lights. Install grab bars by the toilet and in the tub and shower. Do not use towel bars as grab bars. Use non-skid mats or decals in the tub or shower. If you need to sit down in the shower, use a plastic, non-slip stool. Keep the floor dry. Clean up any water that spills on the floor as soon as it happens. Remove soap buildup in the tub or shower regularly. Attach bath mats securely with double-sided non-slip rug tape. Do not have throw rugs and other things on the floor that can make you trip. What can I do in the bedroom? Use night lights. Make sure that you have a light by your bed that is easy to reach. Do not use any sheets or blankets that are too big for your bed. They should not hang down onto the floor. Have a firm chair that has side arms. You can use this for support while you get dressed. Do not have throw rugs and other things on the floor that can make you trip. What can I do in the kitchen? Clean up any spills right away. Avoid walking  on wet floors. Keep items that you use a lot in easy-to-reach places. If you need to reach something above you, use a strong step stool that has a grab bar. Keep electrical cords out of the way. Do not use floor polish or wax that makes floors slippery. If you must use wax, use non-skid floor wax. Do not have throw rugs and other things on the floor that can make you trip. What can I do with my stairs? Do not leave any items on the stairs. Make sure that there are handrails on both sides of the stairs and use them. Fix handrails that are broken or loose. Make sure that handrails are as long as the stairways. Check any carpeting to make sure that it is firmly attached to the stairs. Fix any carpet that is loose or worn. Avoid having throw rugs at the top or  bottom of the stairs. If you do have throw rugs, attach them to the floor with carpet tape. Make sure that you have a light switch at the top of the stairs and the bottom of the stairs. If you do not have them, ask someone to add them for you. What else can I do to help prevent falls? Wear shoes that: Do not have high heels. Have rubber bottoms. Are comfortable and fit you well. Are closed at the toe. Do not wear sandals. If you use a stepladder: Make sure that it is fully opened. Do not climb a closed stepladder. Make sure that both sides of the stepladder are locked into place. Ask someone to hold it for you, if possible. Clearly mark and make sure that you can see: Any grab bars or handrails. First and last steps. Where the edge of each step is. Use tools that help you move around (mobility aids) if they are needed. These include: Canes. Walkers. Scooters. Crutches. Turn on the lights when you go into a dark area. Replace any light bulbs as soon as they burn out. Set up your furniture so you have a clear path. Avoid moving your furniture around. If any of your floors are uneven, fix them. If there are any pets around you, be aware of where they are. Review your medicines with your doctor. Some medicines can make you feel dizzy. This can increase your chance of falling. Ask your doctor what other things that you can do to help prevent falls. This information is not intended to replace advice given to you by your health care provider. Make sure you discuss any questions you have with your health care provider. Document Released: 01/01/2009 Document Revised: 08/13/2015 Document Reviewed: 04/11/2014 Elsevier Interactive Patient Education  2017 [employer].

## 2022-12-02 ENCOUNTER — Other Ambulatory Visit: Payer: Self-pay | Admitting: Family Medicine

## 2022-12-02 DIAGNOSIS — H401133 Primary open-angle glaucoma, bilateral, severe stage: Secondary | ICD-10-CM | POA: Diagnosis not present

## 2022-12-02 DIAGNOSIS — H04129 Dry eye syndrome of unspecified lacrimal gland: Secondary | ICD-10-CM | POA: Diagnosis not present

## 2023-01-04 DIAGNOSIS — M4120 Other idiopathic scoliosis, site unspecified: Secondary | ICD-10-CM | POA: Diagnosis not present

## 2023-01-13 ENCOUNTER — Other Ambulatory Visit: Payer: Self-pay | Admitting: Family Medicine

## 2023-01-13 DIAGNOSIS — I739 Peripheral vascular disease, unspecified: Secondary | ICD-10-CM

## 2023-01-19 ENCOUNTER — Other Ambulatory Visit: Payer: Self-pay | Admitting: Family Medicine

## 2023-01-23 ENCOUNTER — Other Ambulatory Visit: Payer: Self-pay | Admitting: Family Medicine

## 2023-01-23 DIAGNOSIS — E039 Hypothyroidism, unspecified: Secondary | ICD-10-CM

## 2023-01-26 ENCOUNTER — Other Ambulatory Visit: Payer: Self-pay | Admitting: Family Medicine

## 2023-02-02 ENCOUNTER — Encounter: Payer: Self-pay | Admitting: Physician Assistant

## 2023-02-02 ENCOUNTER — Ambulatory Visit (INDEPENDENT_AMBULATORY_CARE_PROVIDER_SITE_OTHER): Payer: Medicare Other | Admitting: Physician Assistant

## 2023-02-02 VITALS — BP 122/60 | HR 73 | Temp 97.3°F | Resp 14 | Ht 60.0 in | Wt 140.0 lb

## 2023-02-02 DIAGNOSIS — E039 Hypothyroidism, unspecified: Secondary | ICD-10-CM | POA: Diagnosis not present

## 2023-02-02 DIAGNOSIS — R7303 Prediabetes: Secondary | ICD-10-CM | POA: Diagnosis not present

## 2023-02-02 DIAGNOSIS — E118 Type 2 diabetes mellitus with unspecified complications: Secondary | ICD-10-CM

## 2023-02-02 DIAGNOSIS — Z23 Encounter for immunization: Secondary | ICD-10-CM

## 2023-02-02 DIAGNOSIS — D649 Anemia, unspecified: Secondary | ICD-10-CM | POA: Diagnosis not present

## 2023-02-02 DIAGNOSIS — E559 Vitamin D deficiency, unspecified: Secondary | ICD-10-CM

## 2023-02-02 DIAGNOSIS — I739 Peripheral vascular disease, unspecified: Secondary | ICD-10-CM

## 2023-02-02 DIAGNOSIS — E782 Mixed hyperlipidemia: Secondary | ICD-10-CM

## 2023-02-02 DIAGNOSIS — I10 Essential (primary) hypertension: Secondary | ICD-10-CM | POA: Diagnosis not present

## 2023-02-02 DIAGNOSIS — K2100 Gastro-esophageal reflux disease with esophagitis, without bleeding: Secondary | ICD-10-CM | POA: Diagnosis not present

## 2023-02-02 DIAGNOSIS — E119 Type 2 diabetes mellitus without complications: Secondary | ICD-10-CM

## 2023-02-02 MED ORDER — METOPROLOL SUCCINATE ER 50 MG PO TB24: 50.0000 mg | ORAL_TABLET | Freq: Every day | ORAL | 1 refills | Status: DC

## 2023-02-02 NOTE — Progress Notes (Addendum)
Subjective:  Patient ID: Amber Brown, female    DOB: [birth year]  Age: 89 y.o. MRN: [id number]  Chief Complaint  Patient presents with   Medical Management of Chronic Issues    HPI Pt presents for follow up of hypertension. The patient is tolerating the medication well without side effects. Compliance with treatment has been good; including taking medication as directed , maintains a healthy diet and regular exercise regimen , and following up as directed. Patient states she has been having some swelling in her ankles - however at last visit she was advised to stop amlodopine but pt actually continued that medication and inadvertantly stopped Toprol XL 50mg  qd - she still is taking losartan 100mg  qd  Mixed hyperlipidemia  Pt presents with hyperlipidemia. Compliance with treatment has been good -The patient is compliant with medications, maintains a low cholesterol diet , follows up as directed The patient denies experiencing any hypercholesterolemia related symptoms. She is taking lipitor 40mg  qd  Pt with GERD - stable on protonix 40mg  qd  Pt with mild vit D deficiency - currently not on a supplement -   Pt with hypothyroidism - currently on synthroid qd - due for labwork  Pt with diabetes with last hgb A1c at 6.0 - at one point she was on glucophage but at wellness visit had stopped that medication - currently not on medication  Pt would like flu shot today      11/24/2022    9:19 AM 10/21/2022    9:26 AM 05/31/2022   11:14 AM 01/12/2022    1:00 PM 08/03/2021   10:43 AM  Depression screen PHQ 2/9  Decreased Interest 0 0 0 0 0  Down, Depressed, Hopeless 0 0 0 0 0  PHQ - 2 Score 0 0 0 0 0        12/22/2020    7:20 AM 08/03/2021   10:46 AM 05/31/2022   11:14 AM 10/21/2022    9:25 AM 11/24/2022    9:25 AM  Fall Risk  Falls in the past year?  1 0 0 1  Was there an injury with Fall?  0 0 0 0  Fall Risk Category Calculator  1 0 0 1  Fall Risk Category (Retired)  Low     (RETIRED)  Patient Fall Risk Level High fall risk High fall risk     Patient at Risk for Falls Due to  Impaired balance/gait Impaired vision  Impaired balance/gait;Impaired vision  Fall risk Follow up  Falls prevention discussed Falls evaluation completed Falls evaluation completed Falls evaluation completed;Falls prevention discussed     ROS CONSTITUTIONAL: Negative for chills, fatigue, fever, unintentional weight gain and unintentional weight loss.  E/N/T: Negative for ear pain, nasal congestion and sore throat.  CARDIOVASCULAR: Negative for chest pain, dizziness, palpitations - has mild pedal edema intermittently RESPIRATORY: Negative for recent cough and dyspnea.  GASTROINTESTINAL: Negative for abdominal pain, acid reflux symptoms, constipation, diarrhea, nausea and vomiting.  MSK: Negative for arthralgias and myalgias.  INTEGUMENTARY: Negative for rash.  NEUROLOGICAL: Negative for dizziness and headaches.  PSYCHIATRIC: Negative for sleep disturbance and to question depression screen.  Negative for depression, negative for anhedonia.    Current Outpatient Medications:    atorvastatin (LIPITOR) 40 MG tablet, TAKE 1 TABLET BY MOUTH DAILY, Disp: 100 tablet, Rfl: 0   clopidogrel (PLAVIX) 75 MG tablet, TAKE 1 TABLET BY MOUTH DAILY, Disp: 100 tablet, Rfl: 0   dorzolamide-timolol (COSOPT) 22.3-6.8 MG/ML ophthalmic solution, Place 1 drop into  both eyes 2 (two) times daily. , Disp: , Rfl:    levothyroxine (SYNTHROID) 50 MCG tablet, TAKE 1 TABLET BY MOUTH DAILY, Disp: 100 tablet, Rfl: 2   losartan (COZAAR) 100 MG tablet, TAKE 1 TABLET BY MOUTH DAILY, Disp: 90 tablet, Rfl: 3   Multiple Vitamins-Minerals (OCUVITE ADULT FORMULA PO), Take 1 tablet by mouth daily., Disp: , Rfl:    pantoprazole (PROTONIX) 40 MG tablet, TAKE 1 TABLET BY MOUTH DAILY, Disp: 100 tablet, Rfl: 2   ROCKLATAN 0.02-0.005 % SOLN, Place 1 drop into both eyes at bedtime., Disp: , Rfl:    SSD 1 % cream, Apply topically daily as needed  (abrasions)., Disp: 50 g, Rfl: 1   amLODipine (NORVASC) 5 MG tablet, TAKE 1 TABLET BY MOUTH DAILY, Disp: 100 tablet, Rfl: 2   metoprolol succinate (TOPROL-XL) 50 MG 24 hr tablet, Take 1 tablet (50 mg total) by mouth daily. Take with or immediately following a meal., Disp: 90 tablet, Rfl: 1   Vitamin D, Ergocalciferol, (DRISDOL) 1.25 MG (50000 UNIT) CAPS capsule, Take 1 capsule (50,000 Units total) by mouth every 7 (seven) days., Disp: 5 capsule, Rfl: 5  Past Medical History:  Diagnosis Date   Arthritis    Degenerative scoliosis in adult patient 01/06/2015   GERD with esophagitis 05/28/2019   Glaucoma 02/20/2017   History of kidney stones    Hypertension 02/20/2017   Hypothyroidism, adult 05/28/2019   Mixed hyperlipidemia 05/28/2019   Osteoporosis of forearm without pathological fracture 05/28/2019   Peripheral vascular disease (HCC) 05/28/2019   Sequelae of cerebral infarction 05/28/2019   Stroke (HCC)    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   Objective:  PHYSICAL EXAM:   BP 122/60   Pulse 73   Temp (!) 97.3 F (36.3 C)   Resp 14   Ht 5' (1.524 m)   Wt 140 lb (63.5 kg)   LMP  (LMP Unknown)   SpO2 99%   BMI 27.34 kg/m    GEN: Well nourished, well developed, in no acute distress  Cardiac: RRR; no murmurs, rubs, or gallops,minimal edema noted in both ankles Respiratory:  normal respiratory rate and pattern with no distress - normal breath sounds with no rales, rhonchi, wheezes or rubs MS: no deformity or atrophy  Skin: warm and dry, no rash  Neuro:  Alert and Oriented x 3,- CN II-Xii grossly intact Psych: euthymic mood, appropriate affect and demeanor  Assessment & Plan:    Primary hypertension Assessment & Plan: Labwork pending Stop amlodipine and restart metoprolol Continue losartan  Orders: -     CBC with Differential/Platelet -     Comprehensive metabolic panel  Peripheral vascular disease (HCC) Assessment & Plan: stable Continue Plavix  Gastroesophageal  reflux disease with esophagitis without hemorrhage Assessment & Plan: The current medical regimen is effective;  continue present plan and medications.  Pantoprazole   Hypothyroidism, adult Assessment & Plan: Previously well controlled Continue Synthroid at current dose     Orders: -     TSH  Prediabetes Assessment & Plan: Continue to watch diet   Orders: -     Hemoglobin A1c  Mixed hyperlipidemia Assessment & Plan: Well controlled.  No changes to medicines. Continue lipitor 40 mg before bed.  Continue to work on eating a healthy diet and exercise.  Labs drawn today.    Orders: -     Lipid panel  Mild vitamin D deficiency -     VITAMIN D 25 Hydroxy (Vit-D Deficiency, Fractures)  Needs  flu shot -     Flu Vaccine Trivalent High Dose (Fluad)  Other orders -     Metoprolol Succinate ER; Take 1 tablet (50 mg total) by mouth daily. Take with or immediately following a meal.  Dispense: 90 tablet; Refill: 1     Follow-up: Return in about 4 months (around 06/02/2023) for chronic fasting follow-up with Dr Sedalia Muta.  An After Visit Summary was printed and given to the patient.  Jettie Pagan [facility] [phone]

## 2023-02-02 NOTE — Assessment & Plan Note (Signed)
Previously well controlled Continue Synthroid at current dose  

## 2023-02-02 NOTE — Assessment & Plan Note (Signed)
The current medical regimen is effective;  continue present plan and medications.  Pantoprazole

## 2023-02-02 NOTE — Assessment & Plan Note (Signed)
stable °

## 2023-02-02 NOTE — Assessment & Plan Note (Addendum)
Hemoglobin A1c 6.0%, 3 month avg of blood sugars, is in prediabetic range.  In order to prevent progression to diabetes, recommend low carb diet and regular exercise 

## 2023-02-02 NOTE — Assessment & Plan Note (Signed)
Well controlled.  No changes to medicines.  Continue lipitor 40 mg before bed.  Continue to work on eating a healthy diet and exercise.  Labs drawn today.   

## 2023-02-02 NOTE — Assessment & Plan Note (Signed)
Controlled.  

## 2023-02-02 NOTE — Progress Notes (Deleted)
Subjective:  Patient ID: Amber Brown, female    DOB: Mar 14, 1929  Age: 89 y.o. MRN: [id number]  Chief Complaint  Patient presents with   Medical Management of Chronic Issues    HPI   GERD: Not taking Protonix 40 mg daily  HTN: Metoprolol 50 mg daily, Losartan 100 mg daily, Norvasc 5 mg daily  Prediabetes: Metformin 500 mg daily Last A1c 6.3  Cholesterol: Lipitor 40 mg daily  Osteoporosis: On holiday from fosamax     11/24/2022    9:19 AM 10/21/2022    9:26 AM 05/31/2022   11:14 AM 01/12/2022    1:00 PM 08/03/2021   10:43 AM  Depression screen PHQ 2/9  Decreased Interest 0 0 0 0 0  Down, Depressed, Hopeless 0 0 0 0 0  PHQ - 2 Score 0 0 0 0 0        11/24/2022    9:25 AM  Fall Risk   Falls in the past year? 1  Number falls in past yr: 0  Injury with Fall? 0  Risk for fall due to : Impaired balance/gait;Impaired vision  Follow up Falls evaluation completed;Falls prevention discussed    Patient Care Team: Cox, Fritzi Mandes, MD as PCP - General (Family Medicine)   Review of Systems  Current Outpatient Medications on File Prior to Visit  Medication Sig Dispense Refill   amLODipine (NORVASC) 5 MG tablet TAKE 1 TABLET BY MOUTH DAILY 90 tablet 1   atorvastatin (LIPITOR) 40 MG tablet TAKE 1 TABLET BY MOUTH DAILY 100 tablet 0   clopidogrel (PLAVIX) 75 MG tablet TAKE 1 TABLET BY MOUTH DAILY 100 tablet 0   dorzolamide-timolol (COSOPT) 22.3-6.8 MG/ML ophthalmic solution Place 1 drop into both eyes 2 (two) times daily.      levothyroxine (SYNTHROID) 50 MCG tablet TAKE 1 TABLET BY MOUTH DAILY 100 tablet 2   loratadine (CLARITIN) 10 MG tablet Take 1 tablet (10 mg total) by mouth daily. 90 tablet 1   losartan (COZAAR) 100 MG tablet TAKE 1 TABLET BY MOUTH DAILY 90 tablet 3   metoprolol succinate (TOPROL-XL) 50 MG 24 hr tablet Take 1 tablet (50 mg total) by mouth daily. Take with or immediately following a meal. 180 tablet 1   Multiple Vitamins-Minerals (OCUVITE ADULT FORMULA PO) Take 1  tablet by mouth daily.     pantoprazole (PROTONIX) 40 MG tablet TAKE 1 TABLET BY MOUTH DAILY 100 tablet 2   ROCKLATAN 0.02-0.005 % SOLN Place 1 drop into both eyes at bedtime.     SSD 1 % cream Apply topically daily as needed (abrasions). 50 g 1   No current facility-administered medications on file prior to visit.   Past Medical History:  Diagnosis Date   Arthritis    Degenerative scoliosis in adult patient 01/06/2015   GERD with esophagitis 05/28/2019   Glaucoma 02/20/2017   History of kidney stones    Hypertension 02/20/2017   Hypothyroidism, adult 05/28/2019   Mixed hyperlipidemia 05/28/2019   Osteoporosis of forearm without pathological fracture 05/28/2019   Peripheral vascular disease (HCC) 05/28/2019   Sequelae of cerebral infarction 05/28/2019   Stroke [facility])    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   Past Surgical History:  Procedure Laterality Date   ABDOMINAL HYSTERECTOMY     CHOLECYSTECTOMY     TOTAL KNEE ARTHROPLASTY Right 12/21/2020   Procedure: TOTAL KNEE ARTHROPLASTY;  Surgeon: Dannielle Huh, MD;  Location: [facility];  Service: Orthopedics;  Laterality: Right;    Family History  Problem Relation Age of Onset   Aneurysm Mother    Bone cancer Father    Heart Problems Brother    Alcohol abuse Brother    Lung cancer Brother    Social History   Socioeconomic History   Marital status: Widowed    Spouse name: Not on file   Number of children: Not on file   Years of education: Not on file   Highest education level: Not on file  Occupational History   Not on file  Tobacco Use   Smoking status: Never   Smokeless tobacco: Never  Vaping Use   Vaping status: Never Used  Substance and Sexual Activity   Alcohol use: Never   Drug use: Never   Sexual activity: Not Currently  Other Topics Concern   Not on file  Social History Narrative   Not on file   Social Determinants of Health   Financial Resource Strain: Low Risk  (11/24/2022)   Overall Financial Resource Strain  (CARDIA)    Difficulty of Paying Living Expenses: Not hard at all  Food Insecurity: No Food Insecurity (11/24/2022)   Hunger Vital Sign    Worried About Running Out of Food in the Last Year: Never true    Ran Out of Food in the Last Year: Never true  Transportation Needs: No Transportation Needs (11/24/2022)   PRAPARE - Administrator, Civil Service (Medical): No    Lack of Transportation (Non-Medical): No  Physical Activity: Inactive (11/24/2022)   Exercise Vital Sign    Days of Exercise per Week: 0 days    Minutes of Exercise per Session: 0 min  Stress: No Stress Concern Present (11/24/2022)   [employer] of Occupational Health - Occupational Stress Questionnaire    Feeling of Stress : Not at all  Social Connections: Moderately Isolated (11/24/2022)   Social Connection and Isolation Panel [NHANES]    Frequency of Communication with Friends and Family: More than three times a week    Frequency of Social Gatherings with Friends and Family: Twice a week    Attends Religious Services: More than 4 times per year    Active Member of [employer] or Organizations: No    Attends Banker Meetings: Never    Marital Status: Widowed    Objective:  LMP  (LMP Unknown)      11/24/2022    9:00 AM 10/21/2022    9:04 AM 08/04/2022    3:26 PM  BP/Weight  Systolic BP 122 104 124  Diastolic BP 78 64 66  Wt. (Lbs) 133 133   BMI 25.97 kg/m2 25.97 kg/m2     Physical Exam  Diabetic Foot Exam - Simple   No data filed      Lab Results  Component Value Date   WBC 10.4 10/21/2022   HGB 12.9 10/21/2022   HCT 40.3 10/21/2022   PLT 349 10/21/2022   GLUCOSE 108 (H) 10/21/2022   CHOL 124 10/21/2022   TRIG 93 10/21/2022   HDL 60 10/21/2022   LDLCALC 46 10/21/2022   ALT 10 10/21/2022   AST 16 10/21/2022   NA 135 10/21/2022   K 4.9 10/21/2022   CL 100 10/21/2022   CREATININE 0.87 10/21/2022   BUN 15 10/21/2022   CO2 21 10/21/2022   TSH 1.830 01/12/2022   HGBA1C 6.0 (H)  10/21/2022      Assessment & Plan:    Primary hypertension Assessment & Plan: Controlled    Peripheral vascular disease (HCC) Assessment &  Plan: stable   Gastroesophageal reflux disease with esophagitis without hemorrhage Assessment & Plan: The current medical regimen is effective;  continue present plan and medications.  Pantoprazole   Hypothyroidism, adult Assessment & Plan: Previously well controlled Continue Synthroid at current dose      Controlled type 2 diabetes mellitus with complication, without long-term current use of insulin (HCC) Assessment & Plan: Hemoglobin A1c 6.0, 3 month avg of blood sugars, is in prediabetic range.   In order to prevent progression to diabetes, recommend low carb diet and regular exercise.    Mixed hyperlipidemia Assessment & Plan: Well controlled.  No changes to medicines. Continue lipitor 40 mg before bed.  Continue to work on eating a healthy diet and exercise.  Labs drawn today.     Mild vitamin D deficiency     No orders of the defined types were placed in this encounter.   No orders of the defined types were placed in this encounter.    Follow-up: No follow-ups on file.   I,Brinlyn Cena I Leal-Borjas,acting as a scribe for Blane Ohara, MD.,have documented all relevant documentation on the behalf of Blane Ohara, MD,as directed by  Blane Ohara, MD while in the presence of Blane Ohara, MD.   An After Visit Summary was printed and given to the patient.  Blane Ohara, MD [facility] [phone]

## 2023-02-03 ENCOUNTER — Other Ambulatory Visit: Payer: Self-pay

## 2023-02-03 ENCOUNTER — Other Ambulatory Visit: Payer: Self-pay | Admitting: Physician Assistant

## 2023-02-03 DIAGNOSIS — R899 Unspecified abnormal finding in specimens from other organs, systems and tissues: Secondary | ICD-10-CM

## 2023-02-03 DIAGNOSIS — E559 Vitamin D deficiency, unspecified: Secondary | ICD-10-CM

## 2023-02-03 LAB — COMPREHENSIVE METABOLIC PANEL
ALT: 11 [IU]/L (ref 0–32)
AST: 16 [IU]/L (ref 0–40)
Albumin: 3.8 g/dL (ref 3.6–4.6)
Alkaline Phosphatase: 94 [IU]/L (ref 44–121)
BUN/Creatinine Ratio: 10 — ABNORMAL LOW (ref 12–28)
BUN: 9 mg/dL — ABNORMAL LOW (ref 10–36)
Bilirubin Total: 0.4 mg/dL (ref 0.0–1.2)
CO2: 19 mmol/L — ABNORMAL LOW (ref 20–29)
Calcium: 9.1 mg/dL (ref 8.7–10.3)
Chloride: 102 mmol/L (ref 96–106)
Creatinine, Ser: 0.86 mg/dL (ref 0.57–1.00)
Globulin, Total: 2.4 g/dL (ref 1.5–4.5)
Glucose: 119 mg/dL — ABNORMAL HIGH (ref 70–99)
Potassium: 4.8 mmol/L (ref 3.5–5.2)
Sodium: 135 mmol/L (ref 134–144)
Total Protein: 6.2 g/dL (ref 6.0–8.5)
eGFR: 63 mL/min/{1.73_m2} (ref >59)

## 2023-02-03 LAB — CBC WITH DIFFERENTIAL/PLATELET
Basophils Absolute: 0.1 10*3/uL (ref 0.0–0.2)
Basos: 1 %
EOS (ABSOLUTE): 0.1 10*3/uL (ref 0.0–0.4)
Eos: 1 %
Hematocrit: 35.1 % (ref 34.0–46.6)
Hemoglobin: 10.8 g/dL — ABNORMAL LOW (ref 11.1–15.9)
Immature Grans (Abs): 0 10*3/uL (ref 0.0–0.1)
Immature Granulocytes: 0 %
Lymphocytes Absolute: 2.3 10*3/uL (ref 0.7–3.1)
Lymphs: 27 %
MCH: 24.3 pg — ABNORMAL LOW (ref 26.6–33.0)
MCHC: 30.8 g/dL — ABNORMAL LOW (ref 31.5–35.7)
MCV: 79 fL (ref 79–97)
Monocytes Absolute: 0.9 10*3/uL (ref 0.1–0.9)
Monocytes: 11 %
Neutrophils Absolute: 5.1 10*3/uL (ref 1.4–7.0)
Neutrophils: 60 %
Platelets: 377 10*3/uL (ref 150–450)
RBC: 4.45 x10E6/uL (ref 3.77–5.28)
RDW: 14.6 % (ref 11.7–15.4)
WBC: 8.5 10*3/uL (ref 3.4–10.8)

## 2023-02-03 LAB — LIPID PANEL
Chol/HDL Ratio: 2.2 ratio (ref 0.0–4.4)
Cholesterol, Total: 120 mg/dL (ref 100–199)
HDL: 55 mg/dL (ref >39)
LDL Chol Calc (NIH): 48 mg/dL (ref 0–99)
Triglycerides: 87 mg/dL (ref 0–149)
VLDL Cholesterol Cal: 17 mg/dL (ref 5–40)

## 2023-02-03 LAB — HEMOGLOBIN A1C
Est. average glucose Bld gHb Est-mCnc: 140 mg/dL
Hgb A1c MFr Bld: 6.5 % — ABNORMAL HIGH (ref 4.8–5.6)

## 2023-02-03 LAB — TSH: TSH: 2.24 u[IU]/mL (ref 0.450–4.500)

## 2023-02-03 LAB — VITAMIN D 25 HYDROXY (VIT D DEFICIENCY, FRACTURES): Vit D, 25-Hydroxy: 24.2 ng/mL — ABNORMAL LOW (ref 30.0–100.0)

## 2023-02-03 MED ORDER — VITAMIN D (ERGOCALCIFEROL) 1.25 MG (50000 UNIT) PO CAPS: 50000.0000 [IU] | ORAL_CAPSULE | ORAL | 5 refills | Status: DC

## 2023-02-06 LAB — SPECIMEN STATUS REPORT

## 2023-02-06 LAB — IRON,TIBC AND FERRITIN PANEL
Ferritin: 13 ng/mL — ABNORMAL LOW (ref 15–150)
Iron Saturation: 7 % — CL (ref 15–55)
Iron: 25 ug/dL — ABNORMAL LOW (ref 27–139)
Total Iron Binding Capacity: 371 ug/dL (ref 250–450)
UIBC: 346 ug/dL (ref 118–369)

## 2023-02-10 ENCOUNTER — Other Ambulatory Visit: Payer: Self-pay | Admitting: Family Medicine

## 2023-02-13 ENCOUNTER — Telehealth: Payer: Self-pay

## 2023-02-13 DIAGNOSIS — Z23 Encounter for immunization: Secondary | ICD-10-CM | POA: Insufficient documentation

## 2023-02-13 NOTE — Telephone Encounter (Signed)
The patient stated that she is NOT taking Metformin. The patient is unsure who discontinued this.

## 2023-02-13 NOTE — Telephone Encounter (Signed)
I called the patient today per Kennon Rounds, [place] request regarding the pts metformin. Kennon Rounds wants to know if she is taking this or not.  Looks like when she had her AWV with Selena Batten, LPN  it was discontinued at that time. Kennon Rounds is just wanting clarification on if this patient is taking this metformin or not for prediabetes.  Waiting for the patient to call back.

## 2023-02-26 NOTE — Progress Notes (Unsigned)
No show

## 2023-02-27 ENCOUNTER — Encounter (INDEPENDENT_AMBULATORY_CARE_PROVIDER_SITE_OTHER): Payer: Medicare Other | Admitting: Family Medicine

## 2023-02-27 ENCOUNTER — Telehealth: Payer: Self-pay

## 2023-02-27 DIAGNOSIS — E782 Mixed hyperlipidemia: Secondary | ICD-10-CM

## 2023-02-27 NOTE — Telephone Encounter (Unsigned)
Copied from CRM [id number]. Topic: General - Other >> Feb 27, 2023  1:26 PM Donita Brooks wrote: Reason for CRM: Rozell Searing call in to report pt blood pressure reading to Dr. Sedalia Muta today- 107/77 - pulse 83 136/82  pulse-67 127/79  pulse-95 138/81 pulse-70 114/79 pulse-78 149/84 pulse -67 142/86 pulse-69 Rozell Searing call back number [phone]

## 2023-02-28 ENCOUNTER — Telehealth: Payer: Self-pay

## 2023-02-28 NOTE — Telephone Encounter (Signed)
Copied from CRM [id number]. Topic: Clinical - Medical Advice >> Feb 28, 2023 10:27 AM Gaetano Hawthorne wrote: Reason for CRM: Caregiver is calling to speak with Kennon Rounds - there seems to be some confusion around whether she needs to come in or just call in with her BP readings. Please call the number below to speak with her niece to review.  [phone]

## 2023-02-28 NOTE — Telephone Encounter (Signed)
Called back, left message for niece patient needs  A 3 WEEK FOLLOW Up with Dr. Sedalia Muta for her BP

## 2023-02-28 NOTE — Telephone Encounter (Signed)
Called Alona Bene back and left detailed message to call office back.

## 2023-03-02 ENCOUNTER — Ambulatory Visit (INDEPENDENT_AMBULATORY_CARE_PROVIDER_SITE_OTHER): Payer: Medicare Other | Admitting: Physician Assistant

## 2023-03-02 ENCOUNTER — Encounter: Payer: Self-pay | Admitting: Physician Assistant

## 2023-03-02 VITALS — BP 122/78 | HR 75 | Temp 97.8°F | Ht 60.0 in | Wt 140.0 lb

## 2023-03-02 DIAGNOSIS — D508 Other iron deficiency anemias: Secondary | ICD-10-CM

## 2023-03-02 NOTE — Progress Notes (Signed)
Subjective:  Patient ID: Amber Brown, female    DOB: 1928-10-16  Age: 89 y.o. MRN: [id number]  Chief Complaint  Patient presents with   Anemia     HPI  Pt in today for follow up of anemia - she was found to have low hgb and low iron at last visit.  She did start otc daily iron therapy and is tolerating well.  She did not pick up hemoccult cards so I will give them today  Voices no other concerns or problems    11/24/2022    9:19 AM 10/21/2022    9:26 AM 05/31/2022   11:14 AM 01/12/2022    1:00 PM 08/03/2021   10:43 AM  Depression screen PHQ 2/9  Decreased Interest 0 0 0 0 0  Down, Depressed, Hopeless 0 0 0 0 0  PHQ - 2 Score 0 0 0 0 0        12/22/2020    7:20 AM 08/03/2021   10:46 AM 05/31/2022   11:14 AM 10/21/2022    9:25 AM 11/24/2022    9:25 AM  Fall Risk  Falls in the past year?  1 0 0 1  Was there an injury with Fall?  0 0 0 0  Fall Risk Category Calculator  1 0 0 1  Fall Risk Category (Retired)  Low     (RETIRED) Patient Fall Risk Level High fall risk High fall risk     Patient at Risk for Falls Due to  Impaired balance/gait Impaired vision  Impaired balance/gait;Impaired vision  Fall risk Follow up  Falls prevention discussed Falls evaluation completed Falls evaluation completed Falls evaluation completed;Falls prevention discussed     ROS CONSTITUTIONAL: Negative for chills, fatigue, fever,  CARDIOVASCULAR: Negative for chest pain,  RESPIRATORY: Negative for recent cough and dyspnea.  GASTROINTESTINAL: Negative for abdominal pain, acid reflux symptoms, constipation, diarrhea, nausea and vomiting.    Current Outpatient Medications:    atorvastatin (LIPITOR) 40 MG tablet, TAKE 1 TABLET BY MOUTH DAILY, Disp: 100 tablet, Rfl: 0   clopidogrel (PLAVIX) 75 MG tablet, TAKE 1 TABLET BY MOUTH DAILY, Disp: 100 tablet, Rfl: 0   dorzolamide-timolol (COSOPT) 22.3-6.8 MG/ML ophthalmic solution, Place 1 drop into both eyes 2 (two) times daily. , Disp: , Rfl:    levothyroxine  (SYNTHROID) 50 MCG tablet, TAKE 1 TABLET BY MOUTH DAILY, Disp: 100 tablet, Rfl: 2   losartan (COZAAR) 100 MG tablet, TAKE 1 TABLET BY MOUTH DAILY, Disp: 90 tablet, Rfl: 3   metoprolol succinate (TOPROL-XL) 50 MG 24 hr tablet, Take 1 tablet (50 mg total) by mouth daily. Take with or immediately following a meal., Disp: 90 tablet, Rfl: 1   Multiple Vitamins-Minerals (OCUVITE ADULT FORMULA PO), Take 1 tablet by mouth daily., Disp: , Rfl:    pantoprazole (PROTONIX) 40 MG tablet, TAKE 1 TABLET BY MOUTH DAILY, Disp: 100 tablet, Rfl: 2   ROCKLATAN 0.02-0.005 % SOLN, Place 1 drop into both eyes at bedtime., Disp: , Rfl:    SSD 1 % cream, Apply topically daily as needed (abrasions)., Disp: 50 g, Rfl: 1   Vitamin D, Ergocalciferol, (DRISDOL) 1.25 MG (50000 UNIT) CAPS capsule, Take 1 capsule (50,000 Units total) by mouth every 7 (seven) days., Disp: 5 capsule, Rfl: 5   amLODipine (NORVASC) 5 MG tablet, TAKE 1 TABLET BY MOUTH DAILY (Patient not taking: Reported on 03/02/2023), Disp: 100 tablet, Rfl: 2  Past Medical History:  Diagnosis Date   Arthritis    Degenerative scoliosis in  adult patient 01/06/2015   GERD with esophagitis 05/28/2019   Glaucoma 02/20/2017   History of kidney stones    Hypertension 02/20/2017   Hypothyroidism, adult 05/28/2019   Mixed hyperlipidemia 05/28/2019   Osteoporosis of forearm without pathological fracture 05/28/2019   Peripheral vascular disease (HCC) 05/28/2019   Sequelae of cerebral infarction 05/28/2019   Stroke [facility])    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   Objective:  PHYSICAL EXAM:   BP 122/78 (BP Location: Left Arm, Patient Position: Sitting)   Pulse 75   Temp 97.8 F (36.6 C) (Temporal)   Ht 5' (1.524 m)   Wt 140 lb (63.5 kg)   LMP  (LMP Unknown)   SpO2 98%   BMI 27.34 kg/m    GEN: Well nourished, well developed, in no acute distress  Cardiac: RRR; no murmurs,  Respiratory:  normal respiratory rate and pattern with no distress - normal breath sounds  with no rales, rhonchi, wheezes or rubs Psych: euthymic mood, appropriate affect and demeanor  Assessment & Plan:    Other iron deficiency anemia -     CBC with Differential/Platelet -     Iron, TIBC and Ferritin Panel Continue oral iron supplement Hemoccult cards given    Follow-up: Return in about 3 months (around 05/31/2023) for chronic fasting follow-up with Dr Sedalia Muta.  An After Visit Summary was printed and given to the patient.  Jettie Pagan [facility] [phone]

## 2023-03-03 LAB — CBC WITH DIFFERENTIAL/PLATELET
Basophils Absolute: 0 10*3/uL (ref 0.0–0.2)
Basos: 1 %
EOS (ABSOLUTE): 0.1 10*3/uL (ref 0.0–0.4)
Eos: 1 %
Hematocrit: 36.9 % (ref 34.0–46.6)
Hemoglobin: 11.3 g/dL (ref 11.1–15.9)
Immature Grans (Abs): 0 10*3/uL (ref 0.0–0.1)
Immature Granulocytes: 0 %
Lymphocytes Absolute: 2.7 10*3/uL (ref 0.7–3.1)
Lymphs: 34 %
MCH: 24.1 pg — ABNORMAL LOW (ref 26.6–33.0)
MCHC: 30.6 g/dL — ABNORMAL LOW (ref 31.5–35.7)
MCV: 79 fL (ref 79–97)
Monocytes Absolute: 0.8 10*3/uL (ref 0.1–0.9)
Monocytes: 10 %
Neutrophils Absolute: 4.4 10*3/uL (ref 1.4–7.0)
Neutrophils: 54 %
Platelets: 362 10*3/uL (ref 150–450)
RBC: 4.68 x10E6/uL (ref 3.77–5.28)
RDW: 16.4 % — ABNORMAL HIGH (ref 11.7–15.4)
WBC: 8 10*3/uL (ref 3.4–10.8)

## 2023-03-03 LAB — IRON,TIBC AND FERRITIN PANEL
Ferritin: 19 ng/mL (ref 15–150)
Iron Saturation: 15 % (ref 15–55)
Iron: 58 ug/dL (ref 27–139)
Total Iron Binding Capacity: 376 ug/dL (ref 250–450)
UIBC: 318 ug/dL (ref 118–369)

## 2023-03-24 ENCOUNTER — Other Ambulatory Visit: Payer: Self-pay | Admitting: Family Medicine

## 2023-03-24 DIAGNOSIS — I739 Peripheral vascular disease, unspecified: Secondary | ICD-10-CM

## 2023-04-07 DIAGNOSIS — H04129 Dry eye syndrome of unspecified lacrimal gland: Secondary | ICD-10-CM | POA: Diagnosis not present

## 2023-04-07 DIAGNOSIS — H401133 Primary open-angle glaucoma, bilateral, severe stage: Secondary | ICD-10-CM | POA: Diagnosis not present

## 2023-05-02 ENCOUNTER — Other Ambulatory Visit: Payer: Self-pay | Admitting: Physician Assistant

## 2023-06-14 NOTE — Progress Notes (Addendum)
 Subjective:  Patient ID: Amber Brown, female    DOB: 10/22/1928  Age: 89 y.o. MRN: [id number]  Chief Complaint  Patient presents with   Medical Management of Chronic Issues   Discussed the use of AI scribe software for clinical note transcription with the patient, who gave verbal consent to proceed.  History of Present Illness   The patient, with a history of hypertension, acid reflux, high cholesterol, and thyroid issues, presents with a persistent cough and runny nose for the past month. The symptoms are suspected to be due to allergies, but the patient reports no relief from allergy medication taken daily. The patient also reports a history of bunion surgery on both feet.  In addition to the allergy symptoms, the patient has been diagnosed with diabetes, with an A1c level of 6.5. The patient reports trying to limit sugar intake and does not consume much bread or pasta. The patient also has a history of anemia, but is not currently taking an iron supplement.  The patient is legally blind and sees an eye doctor every three to four months.     GERD: On Protonix 40 mg daily. HTN: Metoprolol 50 mg daily, Losartan 100 mg daily,  Diabetes: new diagnosis in 01/2023.  Patient was unaware despite   Cholesterol: Lipitor 40 mg daily  Osteoporosis: On holiday from fosamax      06/15/2023    9:33 AM 11/24/2022    9:19 AM 10/21/2022    9:26 AM 05/31/2022   11:14 AM 01/12/2022    1:00 PM  Depression screen PHQ 2/9  Decreased Interest 0 0 0 0 0  Down, Depressed, Hopeless 0 0 0 0 0  PHQ - 2 Score 0 0 0 0 0  Altered sleeping 0      Tired, decreased energy 0      Change in appetite 0      Feeling bad or failure about yourself  0      Trouble concentrating 0      Moving slowly or fidgety/restless 0      Suicidal thoughts 0      PHQ-9 Score 0      Difficult doing work/chores Not difficult at all            06/15/2023    9:34 AM  Fall Risk   Falls in the past year? 1  Number falls in past  yr: 0  Injury with Fall? 0  Risk for fall due to : No Fall Risks  Follow up Falls evaluation completed    Patient Care Team: Blane Ohara, MD as PCP - General (Family Medicine)   Review of Systems  Constitutional:  Negative for chills, fatigue and fever.  HENT:  Negative for congestion, ear pain, rhinorrhea and sore throat.   Respiratory:  Negative for cough and shortness of breath.   Cardiovascular:  Negative for chest pain.  Gastrointestinal:  Negative for abdominal pain, constipation, diarrhea, nausea and vomiting.  Genitourinary:  Negative for dysuria and urgency.  Musculoskeletal:  Positive for back pain. Negative for myalgias.  Neurological:  Negative for dizziness, weakness, light-headedness and headaches.  Psychiatric/Behavioral:  Negative for dysphoric mood. The patient is not nervous/anxious.     Current Outpatient Medications on File Prior to Visit  Medication Sig Dispense Refill   amLODipine (NORVASC) 5 MG tablet TAKE 1 TABLET BY MOUTH DAILY (Patient not taking: Reported on 03/02/2023) 100 tablet 2   atorvastatin (LIPITOR) 40 MG tablet TAKE 1 TABLET BY MOUTH DAILY  100 tablet 2   clopidogrel (PLAVIX) 75 MG tablet TAKE 1 TABLET BY MOUTH DAILY 100 tablet 2   dorzolamide-timolol (COSOPT) 22.3-6.8 MG/ML ophthalmic solution Place 1 drop into both eyes 2 (two) times daily.      ferrous sulfate 325 (65 FE) MG EC tablet Take 325 mg by mouth daily with breakfast.     levothyroxine (SYNTHROID) 50 MCG tablet TAKE 1 TABLET BY MOUTH DAILY 100 tablet 2   losartan (COZAAR) 100 MG tablet TAKE 1 TABLET BY MOUTH DAILY 90 tablet 3   metoprolol succinate (TOPROL-XL) 50 MG 24 hr tablet TAKE 1 TABLET BY MOUTH DAILY  WITH OR IMMEDIATELY FOLLOWING A  MEAL 100 tablet 2   Multiple Vitamins-Minerals (OCUVITE ADULT FORMULA PO) Take 1 tablet by mouth daily.     pantoprazole (PROTONIX) 40 MG tablet TAKE 1 TABLET BY MOUTH DAILY 100 tablet 2   ROCKLATAN 0.02-0.005 % SOLN Place 1 drop into both eyes at  bedtime.     SSD 1 % cream Apply topically daily as needed (abrasions). 50 g 1   Vitamin D, Ergocalciferol, (DRISDOL) 1.25 MG (50000 UNIT) CAPS capsule Take 1 capsule (50,000 Units total) by mouth every 7 (seven) days. 5 capsule 5   No current facility-administered medications on file prior to visit.   Past Medical History:  Diagnosis Date   Arthritis    Degenerative scoliosis in adult patient 01/06/2015   GERD with esophagitis 05/28/2019   Glaucoma 02/20/2017   History of kidney stones    Hypertension 02/20/2017   Hypothyroidism, adult 05/28/2019   Mixed hyperlipidemia 05/28/2019   Osteoporosis of forearm without pathological fracture 05/28/2019   Peripheral vascular disease (HCC) 05/28/2019   Sequelae of cerebral infarction 05/28/2019   Stroke [facility])    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   Past Surgical History:  Procedure Laterality Date   ABDOMINAL HYSTERECTOMY     CHOLECYSTECTOMY     TOTAL KNEE ARTHROPLASTY Right 12/21/2020   Procedure: TOTAL KNEE ARTHROPLASTY;  Surgeon: Dannielle Huh, MD;  Location: [facility];  Service: Orthopedics;  Laterality: Right;    Family History  Problem Relation Age of Onset   Aneurysm Mother    Bone cancer Father    Heart Problems Brother    Alcohol abuse Brother    Lung cancer Brother    Social History   Socioeconomic History   Marital status: Widowed    Spouse name: Not on file   Number of children: Not on file   Years of education: Not on file   Highest education level: Not on file  Occupational History   Not on file  Tobacco Use   Smoking status: Never   Smokeless tobacco: Never  Vaping Use   Vaping status: Never Used  Substance and Sexual Activity   Alcohol use: Never   Drug use: Never   Sexual activity: Not Currently  Other Topics Concern   Not on file  Social History Narrative   Not on file   Social Drivers of Health   Financial Resource Strain: Low Risk  (11/24/2022)   Overall Financial Resource Strain (CARDIA)     Difficulty of Paying Living Expenses: Not hard at all  Food Insecurity: No Food Insecurity (11/24/2022)   Hunger Vital Sign    Worried About Running Out of Food in the Last Year: Never true    Ran Out of Food in the Last Year: Never true  Transportation Needs: No Transportation Needs (11/24/2022)   PRAPARE - Transportation  Lack of Transportation (Medical): No    Lack of Transportation (Non-Medical): No  Physical Activity: Inactive (11/24/2022)   Exercise Vital Sign    Days of Exercise per Week: 0 days    Minutes of Exercise per Session: 0 min  Stress: No Stress Concern Present (11/24/2022)   [employer] of Occupational Health - Occupational Stress Questionnaire    Feeling of Stress : Not at all  Social Connections: Moderately Isolated (11/24/2022)   Social Connection and Isolation Panel [NHANES]    Frequency of Communication with Friends and Family: More than three times a week    Frequency of Social Gatherings with Friends and Family: Twice a week    Attends Religious Services: More than 4 times per year    Active Member of [employer] or Organizations: No    Attends Banker Meetings: Never    Marital Status: Widowed    Objective:  BP 126/64   Pulse 68   Temp 98.6 F (37 C)   Ht 5' 1.5" (1.562 m)   Wt 140 lb (63.5 kg)   LMP  (LMP Unknown)   SpO2 99%   BMI 26.02 kg/m      06/15/2023    9:30 AM 03/02/2023    2:17 PM 02/02/2023    9:41 AM  BP/Weight  Systolic BP 126 122 122  Diastolic BP 64 78 60  Wt. (Lbs) 140 140   BMI 26.02 kg/m2 27.34 kg/m2     Physical Exam Vitals reviewed.  Constitutional:      Appearance: Normal appearance.  HENT:     Right Ear: Tympanic membrane, ear canal and external ear normal.     Left Ear: Tympanic membrane, ear canal and external ear normal.     Nose: Nose normal.     Right Turbinates: Enlarged, swollen and pale.     Left Turbinates: Enlarged, swollen and pale.     Mouth/Throat:     Pharynx: Oropharynx is clear.   Cardiovascular:     Rate and Rhythm: Normal rate and regular rhythm.     Heart sounds: Normal heart sounds. No murmur heard. Pulmonary:     Effort: Pulmonary effort is normal. No respiratory distress.     Breath sounds: Normal breath sounds.  Lymphadenopathy:     Cervical: No cervical adenopathy.  Neurological:     Mental Status: She is alert and oriented to person, place, and time.  Psychiatric:        Mood and Affect: Mood normal.        Behavior: Behavior normal.     Diabetic Foot Exam - Simple   Simple Foot Form  06/15/2023 12:15 AM  Visual Inspection No deformities, no ulcerations, no other skin breakdown bilaterally: Yes Sensation Testing Intact to touch and monofilament testing bilaterally: Yes Pulse Check Posterior Tibialis and Dorsalis pulse intact bilaterally: Yes Comments      Lab Results  Component Value Date   WBC 9.3 06/15/2023   HGB 12.1 06/15/2023   HCT 38.6 06/15/2023   PLT 333 06/15/2023   GLUCOSE 117 (H) 06/15/2023   CHOL 120 06/15/2023   TRIG 93 06/15/2023   HDL 55 06/15/2023   LDLCALC 47 06/15/2023   ALT 12 06/15/2023   AST 22 06/15/2023   NA 134 06/15/2023   K 4.3 06/15/2023   CL 101 06/15/2023   CREATININE 0.93 06/15/2023   BUN 11 06/15/2023   CO2 21 06/15/2023   TSH 2.240 02/02/2023   HGBA1C 6.6 (H) 06/15/2023  Assessment & Plan:  Assessment and Plan    Allergic Rhinitis Chronic nasal congestion and rhinorrhea for one month, likely due to allergies. Currently taking loratadine daily with limited relief. Nasal examination shows swelling, indicating inflammation consistent with allergic rhinitis. Discussed adding Flonase nasal spray to improve symptoms. Considered alternative antihistamines like Allegra or Zyrtec, but decided to try nasal spray first. - Prescribe Flonase nasal spray to be used once daily in addition to loratadine. - Send a prescription for loratadine to [facility] for mail delivery and a 30-day supply to the local  pharmacy to bridge the gap.  Type 2 Diabetes Mellitus Recent hemoglobin A1c of 6.5%, indicating diabetes. Previous A1c levels fluctuated between 6.0% and 6.3%. No aggressive treatment planned at this time due to stable levels. Emphasis on dietary modifications to limit sugar intake. Discussed the importance of monitoring for diabetic complications such as foot sores and eye issues. - Advise dietary modifications to limit sugar intake, including reducing sweets, fruits, breads, and pastas. - Monitor hemoglobin A1c levels regularly. - Order microalbumin test to monitor kidney function. - Ensure regular foot examinations and educate on self-monitoring for sores.  Anemia Previous blood work indicated anemia with low iron levels. Currently not taking iron supplements. Will reassess iron levels to determine if supplementation is needed. - Order blood work to recheck iron levels. - Consider reinitiating ferrous sulfate if anemia persists.  Hypertension Blood pressure is well-controlled on current regimen of losartan and metoprolol.  Hyperlipidemia Cholesterol levels are well-controlled on atorvastatin.  Hypothyroidism Well-managed on levothyroxine 50 mcg daily.  General Health Maintenance She is legally blind and sees an eye doctor every 3-4 months. Importance of informing the eye doctor about diabetes was discussed. Up to date on vaccinations except for tetanus. - Ensure eye doctor is informed about diabetes diagnosis and request a report from the eye doctor. - Recommend tetanus vaccine at the pharmacy as Medicare covers it there. - Administer COVID-19 vaccine today.  Follow-up Routine follow-up and monitoring of conditions discussed. - Follow up with blood work results to assess anemia and diabetes management. - Ensure regular eye examinations and monitoring of diabetic complications.         Primary hypertension Assessment & Plan: Blood pressure is well-controlled on current  regimen of losartan and metoprolol.  Orders: -     CBC with Differential/Platelet -     Comprehensive metabolic panel with GFR  Gastroesophageal reflux disease with esophagitis without hemorrhage Assessment & Plan: The current medical regimen is effective;  continue present plan and medications.  On Protonix 40 mg daily.   Hypothyroidism, adult Assessment & Plan: Well-managed on levothyroxine 50 mcg daily.   Mixed hyperlipidemia Assessment & Plan: Cholesterol levels are well-controlled on atorvastatin.  Orders: -     Lipid panel  Controlled type 2 diabetes mellitus with complication, without long-term current use of insulin (HCC) Assessment & Plan: Complications include hypertension and hyperlipidemia. Recent hemoglobin A1c of 6.5%, indicating diabetes. Previous A1c levels fluctuated between 6.0% and 6.3%. No aggressive treatment planned at this time due to stable levels. Emphasis on dietary modifications to limit sugar intake. Discussed the importance of monitoring for diabetic complications such as foot sores and eye issues. - Advise dietary modifications to limit sugar intake, including reducing sweets, fruits, breads, and pastas. - Monitor hemoglobin A1c levels regularly. - Order microalbumin test to monitor kidney function. - Ensure regular foot examinations and educate on self-monitoring for sores.  Orders: -     Hemoglobin A1c -  Microalbumin / creatinine urine ratio  Seasonal allergic rhinitis due to pollen Assessment & Plan: Chronic nasal congestion and rhinorrhea for one month, likely due to allergies. Currently taking loratadine daily with limited relief. Nasal examination shows swelling, indicating inflammation consistent with allergic rhinitis. Discussed adding Flonase nasal spray to improve symptoms. Considered alternative antihistamines like Allegra or Zyrtec, but decided to try nasal spray first. - Prescribe Flonase nasal spray to be used once daily in  addition to loratadine. - Send a prescription for loratadine to [facility] for mail delivery and a 30-day supply to the local pharmacy to bridge the gap.  Orders: -     Loratadine; Take 1 tablet (10 mg total) by mouth daily.  Dispense: 30 tablet; Refill: 11 -     Fluticasone Propionate; Place 2 sprays into both nostrils daily.  Dispense: 16 g; Refill: 6  Other iron deficiency anemia Assessment & Plan: Previous blood work indicated anemia with low iron levels. Currently not taking iron supplements. Will reassess iron levels to determine if supplementation is needed. - Order blood work to recheck iron levels. - Consider reinitiating ferrous sulfate if anemia persists.  Orders: -     Iron, TIBC and Ferritin Panel  Encounter for immunization -     Pfizer Comirnaty Covid-19 Vaccine 64yrs & older  Legally blind Assessment & Plan: Legally blind and sees an eye doctor every 3-4 months. Importance of informing the eye doctor about diabetes was discussed. - Ensure eye doctor is informed about diabetes diagnosis and request a report from the eye doctor.      Meds ordered this encounter  Medications   loratadine (CLARITIN) 10 MG tablet    Sig: Take 1 tablet (10 mg total) by mouth daily.    Dispense:  30 tablet    Refill:  11   fluticasone (FLONASE) 50 MCG/ACT nasal spray    Sig: Place 2 sprays into both nostrils daily.    Dispense:  16 g    Refill:  6    Orders Placed This Encounter  Procedures   Pfizer Comirnaty Covid-19 Vaccine 9yrs & older   CBC with Differential/Platelet   Comprehensive metabolic panel   Hemoglobin A1c   Lipid panel   Microalbumin / creatinine urine ratio   Iron, TIBC and Ferritin Panel     Follow-up: Return in about 3 months (around 09/15/2023) for chronic follow up.   I,Marla I Leal-Borjas,acting as a scribe for Blane Ohara, MD.,have documented all relevant documentation on the behalf of Blane Ohara, MD,as directed by  Blane Ohara, MD while in the  presence of Blane Ohara, MD.   An After Visit Summary was printed and given to the patient.  Blane Ohara, MD [facility] [phone]

## 2023-06-15 ENCOUNTER — Encounter: Payer: Self-pay | Admitting: Family Medicine

## 2023-06-15 ENCOUNTER — Ambulatory Visit: Payer: Medicare Other | Admitting: Family Medicine

## 2023-06-15 VITALS — BP 126/64 | HR 68 | Temp 98.6°F | Ht 61.5 in | Wt 140.0 lb

## 2023-06-15 DIAGNOSIS — E782 Mixed hyperlipidemia: Secondary | ICD-10-CM

## 2023-06-15 DIAGNOSIS — J301 Allergic rhinitis due to pollen: Secondary | ICD-10-CM

## 2023-06-15 DIAGNOSIS — E039 Hypothyroidism, unspecified: Secondary | ICD-10-CM

## 2023-06-15 DIAGNOSIS — H548 Legal blindness, as defined in USA: Secondary | ICD-10-CM

## 2023-06-15 DIAGNOSIS — Z23 Encounter for immunization: Secondary | ICD-10-CM

## 2023-06-15 DIAGNOSIS — I10 Essential (primary) hypertension: Secondary | ICD-10-CM

## 2023-06-15 DIAGNOSIS — E118 Type 2 diabetes mellitus with unspecified complications: Secondary | ICD-10-CM

## 2023-06-15 DIAGNOSIS — K2100 Gastro-esophageal reflux disease with esophagitis, without bleeding: Secondary | ICD-10-CM | POA: Diagnosis not present

## 2023-06-15 DIAGNOSIS — D508 Other iron deficiency anemias: Secondary | ICD-10-CM | POA: Diagnosis not present

## 2023-06-15 MED ORDER — FLUTICASONE PROPIONATE 50 MCG/ACT NA SUSP: 2.0000 | Freq: Every day | NASAL | 6 refills | Status: DC

## 2023-06-15 MED ORDER — LORATADINE 10 MG PO TABS
10.0000 mg | ORAL_TABLET | Freq: Every day | ORAL | 11 refills | Status: DC
Start: 2023-06-15 — End: 2023-12-15

## 2023-06-16 DIAGNOSIS — D649 Anemia, unspecified: Secondary | ICD-10-CM | POA: Insufficient documentation

## 2023-06-16 DIAGNOSIS — J301 Allergic rhinitis due to pollen: Secondary | ICD-10-CM | POA: Insufficient documentation

## 2023-06-16 LAB — IRON,TIBC AND FERRITIN PANEL
Ferritin: 15 ng/mL (ref 15–150)
Iron Saturation: 7 % — CL (ref 15–55)
Iron: 28 ug/dL (ref 27–139)
Total Iron Binding Capacity: 375 ug/dL (ref 250–450)
UIBC: 347 ug/dL (ref 118–369)

## 2023-06-16 LAB — COMPREHENSIVE METABOLIC PANEL WITH GFR
ALT: 12 IU/L (ref 0–32)
AST: 22 IU/L (ref 0–40)
Albumin: 4.1 g/dL (ref 3.6–4.6)
Alkaline Phosphatase: 85 IU/L (ref 44–121)
BUN/Creatinine Ratio: 12 (ref 12–28)
BUN: 11 mg/dL (ref 10–36)
Bilirubin Total: 0.5 mg/dL (ref 0.0–1.2)
CO2: 21 mmol/L (ref 20–29)
Calcium: 9.2 mg/dL (ref 8.7–10.3)
Chloride: 101 mmol/L (ref 96–106)
Creatinine, Ser: 0.93 mg/dL (ref 0.57–1.00)
Globulin, Total: 2.4 g/dL (ref 1.5–4.5)
Glucose: 117 mg/dL — ABNORMAL HIGH (ref 70–99)
Potassium: 4.3 mmol/L (ref 3.5–5.2)
Sodium: 134 mmol/L (ref 134–144)
Total Protein: 6.5 g/dL (ref 6.0–8.5)
eGFR: 58 mL/min/{1.73_m2} — ABNORMAL LOW (ref >59)

## 2023-06-16 LAB — CBC WITH DIFFERENTIAL/PLATELET
Basophils Absolute: 0.1 10*3/uL (ref 0.0–0.2)
Basos: 1 %
EOS (ABSOLUTE): 0.1 10*3/uL (ref 0.0–0.4)
Eos: 1 %
Hematocrit: 38.6 % (ref 34.0–46.6)
Hemoglobin: 12.1 g/dL (ref 11.1–15.9)
Immature Grans (Abs): 0 10*3/uL (ref 0.0–0.1)
Immature Granulocytes: 0 %
Lymphocytes Absolute: 2.8 10*3/uL (ref 0.7–3.1)
Lymphs: 30 %
MCH: 25.2 pg — ABNORMAL LOW (ref 26.6–33.0)
MCHC: 31.3 g/dL — ABNORMAL LOW (ref 31.5–35.7)
MCV: 80 fL (ref 79–97)
Monocytes Absolute: 0.8 10*3/uL (ref 0.1–0.9)
Monocytes: 9 %
Neutrophils Absolute: 5.6 10*3/uL (ref 1.4–7.0)
Neutrophils: 59 %
Platelets: 333 10*3/uL (ref 150–450)
RBC: 4.8 x10E6/uL (ref 3.77–5.28)
RDW: 14.6 % (ref 11.7–15.4)
WBC: 9.3 10*3/uL (ref 3.4–10.8)

## 2023-06-16 LAB — MICROALBUMIN / CREATININE URINE RATIO
Creatinine, Urine: 139.5 mg/dL
Microalb/Creat Ratio: 22 mg/g{creat} (ref 0–29)
Microalbumin, Urine: 31 ug/mL

## 2023-06-16 LAB — LIPID PANEL
Chol/HDL Ratio: 2.2 ratio (ref 0.0–4.4)
Cholesterol, Total: 120 mg/dL (ref 100–199)
HDL: 55 mg/dL (ref >39)
LDL Chol Calc (NIH): 47 mg/dL (ref 0–99)
Triglycerides: 93 mg/dL (ref 0–149)
VLDL Cholesterol Cal: 18 mg/dL (ref 5–40)

## 2023-06-16 LAB — HEMOGLOBIN A1C
Est. average glucose Bld gHb Est-mCnc: 143 mg/dL
Hgb A1c MFr Bld: 6.6 % — ABNORMAL HIGH (ref 4.8–5.6)

## 2023-06-16 NOTE — Assessment & Plan Note (Signed)
 Cholesterol levels are well-controlled on atorvastatin.

## 2023-06-16 NOTE — Assessment & Plan Note (Signed)
 Previous blood work indicated anemia with low iron levels. Currently not taking iron supplements. Will reassess iron levels to determine if supplementation is needed. - Order blood work to recheck iron levels. - Consider reinitiating ferrous sulfate if anemia persists.

## 2023-06-16 NOTE — Assessment & Plan Note (Addendum)
 Legally blind and sees an eye doctor every 3-4 months. Importance of informing the eye doctor about diabetes was discussed. - Ensure eye doctor is informed about diabetes diagnosis and request a report from the eye doctor.

## 2023-06-16 NOTE — Assessment & Plan Note (Deleted)
Control: Well controlled. Recommend check sugars fasting daily. Recommend check feet daily. Recommend annual eye exams. Medicines: none. Continue to work on eating a healthy diet and exercise.  Labs drawn today.    

## 2023-06-16 NOTE — Assessment & Plan Note (Deleted)
 Well controlled.  No changes to medicines. Metoprolol 50 mg daily, Losartan 100 mg daily,  Continue to work on eating a healthy diet and exercise.  Labs drawn today.

## 2023-06-16 NOTE — Assessment & Plan Note (Deleted)
 Sent Loratadine and Flonase nasal spray.

## 2023-06-16 NOTE — Assessment & Plan Note (Signed)
 Well-managed on levothyroxine 50 mcg daily.

## 2023-06-16 NOTE — Assessment & Plan Note (Deleted)
 Previously well controlled Continue Synthroid at current dose

## 2023-06-16 NOTE — Assessment & Plan Note (Signed)
 The current medical regimen is effective;  continue present plan and medications.  On Protonix 40 mg daily.

## 2023-06-16 NOTE — Assessment & Plan Note (Signed)
 Blood pressure is well-controlled on current regimen of losartan and metoprolol.

## 2023-06-16 NOTE — Assessment & Plan Note (Addendum)
 Complications include hypertension and hyperlipidemia. Recent hemoglobin A1c of 6.5%, indicating diabetes. Previous A1c levels fluctuated between 6.0% and 6.3%. No aggressive treatment planned at this time due to stable levels. Emphasis on dietary modifications to limit sugar intake. Discussed the importance of monitoring for diabetic complications such as foot sores and eye issues. - Advise dietary modifications to limit sugar intake, including reducing sweets, fruits, breads, and pastas. - Monitor hemoglobin A1c levels regularly. - Order microalbumin test to monitor kidney function. - Ensure regular foot examinations and educate on self-monitoring for sores.

## 2023-06-16 NOTE — Assessment & Plan Note (Signed)
 Chronic nasal congestion and rhinorrhea for one month, likely due to allergies. Currently taking loratadine daily with limited relief. Nasal examination shows swelling, indicating inflammation consistent with allergic rhinitis. Discussed adding Flonase nasal spray to improve symptoms. Considered alternative antihistamines like Allegra or Zyrtec, but decided to try nasal spray first. - Prescribe Flonase nasal spray to be used once daily in addition to loratadine. - Send a prescription for loratadine to [facility] for mail delivery and a 30-day supply to the local pharmacy to bridge the gap.

## 2023-07-17 DIAGNOSIS — M4120 Other idiopathic scoliosis, site unspecified: Secondary | ICD-10-CM | POA: Diagnosis not present

## 2023-07-17 DIAGNOSIS — M533 Sacrococcygeal disorders, not elsewhere classified: Secondary | ICD-10-CM | POA: Diagnosis not present

## 2023-07-20 ENCOUNTER — Other Ambulatory Visit: Payer: Self-pay

## 2023-08-04 DIAGNOSIS — H04129 Dry eye syndrome of unspecified lacrimal gland: Secondary | ICD-10-CM | POA: Diagnosis not present

## 2023-08-04 DIAGNOSIS — H401133 Primary open-angle glaucoma, bilateral, severe stage: Secondary | ICD-10-CM | POA: Diagnosis not present

## 2023-08-17 DIAGNOSIS — H401133 Primary open-angle glaucoma, bilateral, severe stage: Secondary | ICD-10-CM | POA: Diagnosis not present

## 2023-08-29 ENCOUNTER — Other Ambulatory Visit: Payer: Self-pay | Admitting: Physician Assistant

## 2023-08-29 DIAGNOSIS — E559 Vitamin D deficiency, unspecified: Secondary | ICD-10-CM

## 2023-08-30 DIAGNOSIS — H5210 Myopia, unspecified eye: Secondary | ICD-10-CM | POA: Diagnosis not present

## 2023-09-18 ENCOUNTER — Telehealth: Payer: Self-pay

## 2023-09-18 NOTE — Telephone Encounter (Signed)
 Called  Amber Brown and she is concerned about  confusion and some memory problems. Patient has appointment on 09/20/23 at 9:20 am.  Copied from CRM [id number]. Topic: Clinical - Medication Question >> Sep 18, 2023 11:17 AM Everette C wrote: Reason for CRM: The patient's niece would like to be contacted by a member of clinical staff when possible to discuss potential prescription options for the patient and current medication concerns (which were not elaborated)

## 2023-09-19 NOTE — Progress Notes (Unsigned)
 Subjective:  Patient ID: Amber Brown, female    DOB: 02/14/1929  Age: 89 y.o. MRN: [id number]  Chief Complaint  Patient presents with   Medical Management of Chronic Issues  HPI: The patient, with a history of hypertension, acid reflux, high cholesterol, and thyroid  issues.  The patient is legally blind and sees an eye doctor every three to four months.     GERD: On Protonix  40 mg daily. HTN: Metoprolol  50 mg daily, Losartan  100 mg daily,  Diabetes: No medicines  Cholesterol: Lipitor 40 mg daily  Osteoporosis: On holiday from [place]   Diabetes: last A1c was 6.6. Patient has cut out sugar in her diet. Checking feet daily. Not spilling protein in urine.   Discussed the use of AI scribe software for clinical note transcription with the patient, who gave verbal consent to proceed.  History of Present Illness   Amber Brown is a 89 year old female who presents for follow-up of her chronic conditions.  Chronic pain - Persistent pain, location and nature unspecified  Allergic rhinitis symptoms - Frequent rhinorrhea attributed to allergies - Uncertain effectiveness of allergy medications (Flonase , Claritin ) - No fever, chills, earaches, sore throat, breathing problems, or coughing  Foot pain and deformity - Corn on the second digit of the right foot causing soreness - Uses a pad for relief - History of toe removal due to arthritis and hammer toe  Medication management - Current medications: levothyroxine  50 mcg daily, Plavix , Flonase , Claritin , vitamin D  once a week, atorvastatin , losartan , metoprolol , pantoprazole  - Uncertain if currently taking an iron supplement for anemia - Recent change in eye drops, current medication name unknown  Urinary symptoms - No bladder problems          09/20/2023    9:27 AM 06/15/2023    9:33 AM 11/24/2022    9:19 AM 10/21/2022    9:26 AM 05/31/2022   11:14 AM  Depression screen PHQ 2/9  Decreased Interest 0 0 0 0 0  Down, Depressed,  Hopeless 0 0 0 0 0  PHQ - 2 Score 0 0 0 0 0  Altered sleeping  0     Tired, decreased energy  0     Change in appetite  0     Feeling bad or failure about yourself   0     Trouble concentrating  0     Moving slowly or fidgety/restless  0     Suicidal thoughts  0     PHQ-9 Score  0     Difficult doing work/chores  Not difficult at all           09/20/2023    9:27 AM  Fall Risk   Falls in the past year? 1  Number falls in past yr: 0  Injury with Fall? 0  Risk for fall due to : Impaired mobility;Impaired vision  Follow up Falls evaluation completed    Patient Care Team: Sherre Clapper, MD as PCP - General (Family Medicine)   Review of Systems  Constitutional:  Negative for chills, fatigue and fever.  HENT:  Negative for congestion, ear pain, rhinorrhea and sore throat.   Respiratory:  Negative for cough and shortness of breath.   Cardiovascular:  Negative for chest pain.  Gastrointestinal:  Negative for abdominal pain, constipation, diarrhea, nausea and vomiting.  Genitourinary:  Negative for dysuria and urgency.  Musculoskeletal:  Positive for back pain. Negative for myalgias.  Neurological:  Negative for dizziness, weakness, light-headedness and headaches.  Psychiatric/Behavioral:  Negative for dysphoric mood. The patient is not nervous/anxious.     Current Outpatient Medications on File Prior to Visit  Medication Sig Dispense Refill   atorvastatin  (LIPITOR) 40 MG tablet TAKE 1 TABLET BY MOUTH DAILY 100 tablet 2   clopidogrel  (PLAVIX ) 75 MG tablet TAKE 1 TABLET BY MOUTH DAILY 100 tablet 2   dorzolamide -timolol  (COSOPT ) 22.3-6.8 MG/ML ophthalmic solution Place 1 drop into both eyes 2 (two) times daily.      ferrous sulfate  325 (65 FE) MG EC tablet Take 325 mg by mouth daily with breakfast.     fluticasone  (FLONASE ) 50 MCG/ACT nasal spray Place 2 sprays into both nostrils daily. 16 g 6   levothyroxine  (SYNTHROID ) 50 MCG tablet TAKE 1 TABLET BY MOUTH DAILY 100 tablet 2    loratadine  (CLARITIN ) 10 MG tablet Take 1 tablet (10 mg total) by mouth daily. 30 tablet 11   losartan  (COZAAR ) 100 MG tablet TAKE 1 TABLET BY MOUTH DAILY 90 tablet 3   metoprolol  succinate (TOPROL -XL) 50 MG 24 hr tablet TAKE 1 TABLET BY MOUTH DAILY  WITH OR IMMEDIATELY FOLLOWING A  MEAL 100 tablet 2   Multiple Vitamins-Minerals (OCUVITE ADULT FORMULA PO) Take 1 tablet by mouth daily.     pantoprazole  (PROTONIX ) 40 MG tablet TAKE 1 TABLET BY MOUTH DAILY 100 tablet 2   ROCKLATAN  0.02-0.005 % SOLN Place 1 drop into both eyes at bedtime.     SSD 1 % cream Apply topically daily as needed (abrasions). 50 g 1   Vitamin D , Ergocalciferol , (DRISDOL ) 1.25 MG (50000 UNIT) CAPS capsule TAKE 1 CAPSULE BY MOUTH EVERY 7 DAYS 5 capsule 5   No current facility-administered medications on file prior to visit.   Past Medical History:  Diagnosis Date   Arthritis    Degenerative scoliosis in adult patient 01/06/2015   GERD with esophagitis 05/28/2019   Glaucoma 02/20/2017   History of kidney stones    Hypertension 02/20/2017   Hypothyroidism, adult 05/28/2019   Mixed hyperlipidemia 05/28/2019   Osteoporosis of forearm without pathological fracture 05/28/2019   Peripheral vascular disease (HCC) 05/28/2019   Sequelae of cerebral infarction 05/28/2019   Stroke [facility])    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   Past Surgical History:  Procedure Laterality Date   ABDOMINAL HYSTERECTOMY     CHOLECYSTECTOMY     TOTAL KNEE ARTHROPLASTY Right 12/21/2020   Procedure: TOTAL KNEE ARTHROPLASTY;  Surgeon: Rubie Kemps, MD;  Location: [facility];  Service: Orthopedics;  Laterality: Right;    Family History  Problem Relation Age of Onset   Aneurysm Mother    Bone cancer Father    Heart Problems Brother    Alcohol abuse Brother    Lung cancer Brother    Social History   Socioeconomic History   Marital status: Widowed    Spouse name: Not on file   Number of children: Not on file   Years of education: Not on file    Highest education level: Not on file  Occupational History   Not on file  Tobacco Use   Smoking status: Never   Smokeless tobacco: Never  Vaping Use   Vaping status: Never Used  Substance and Sexual Activity   Alcohol use: Never   Drug use: Never   Sexual activity: Not Currently  Other Topics Concern   Not on file  Social History Narrative   Not on file   Social Drivers of Health   Financial Resource Strain: Low Risk  (  11/24/2022)   Overall Financial Resource Strain (CARDIA)    Difficulty of Paying Living Expenses: Not hard at all  Food Insecurity: No Food Insecurity (09/20/2023)   Hunger Vital Sign    Worried About Running Out of Food in the Last Year: Never true    Ran Out of Food in the Last Year: Never true  Transportation Needs: No Transportation Needs (09/20/2023)   PRAPARE - Administrator, Civil Service (Medical): No    Lack of Transportation (Non-Medical): No  Physical Activity: Inactive (11/24/2022)   Exercise Vital Sign    Days of Exercise per Week: 0 days    Minutes of Exercise per Session: 0 min  Stress: No Stress Concern Present (11/24/2022)   [employer] of Occupational Health - Occupational Stress Questionnaire    Feeling of Stress : Not at all  Social Connections: Moderately Isolated (11/24/2022)   Social Connection and Isolation Panel    Frequency of Communication with Friends and Family: More than three times a week    Frequency of Social Gatherings with Friends and Family: Twice a week    Attends Religious Services: More than 4 times per year    Active Member of [employer] or Organizations: No    Attends Banker Meetings: Never    Marital Status: Widowed    Objective:  BP 110/70   Pulse 87   Temp 97.8 F (36.6 C)   Ht 5' 1.5 (1.562 m)   Wt 135 lb (61.2 kg)   LMP  (LMP Unknown)   SpO2 99%   BMI 25.09 kg/m      09/20/2023    9:23 AM 06/15/2023    9:30 AM 03/02/2023    2:17 PM  BP/Weight  Systolic BP 110 126 122  Diastolic  BP 70 64 78  Wt. (Lbs) 135 140 140  BMI 25.09 kg/m2 26.02 kg/m2 27.34 kg/m2    Physical Exam Vitals reviewed.  Constitutional:      Appearance: Normal appearance. She is normal weight.  Neck:     Vascular: No carotid bruit.  Cardiovascular:     Rate and Rhythm: Normal rate and regular rhythm.     Heart sounds: Normal heart sounds.  Pulmonary:     Effort: Pulmonary effort is normal. No respiratory distress.     Breath sounds: Normal breath sounds.  Abdominal:     General: Abdomen is flat. Bowel sounds are normal.     Palpations: Abdomen is soft.     Tenderness: There is no abdominal tenderness.  Skin:    Findings: Lesion (right foot second digit corn on medial side.) present.  Neurological:     Mental Status: She is alert and oriented to person, place, and time.  Psychiatric:        Mood and Affect: Mood normal.        Behavior: Behavior normal.     {Perform Simple Foot Exam  Perform Detailed exam:1} {Insert foot Exam (Optional):30965}   Lab Results  Component Value Date   WBC 8.9 09/20/2023   HGB 12.3 09/20/2023   HCT 39.8 09/20/2023   PLT 349 09/20/2023   GLUCOSE 140 (H) 09/20/2023   CHOL 125 09/20/2023   TRIG 94 09/20/2023   HDL 56 09/20/2023   LDLCALC 51 09/20/2023   ALT 16 09/20/2023   AST 21 09/20/2023   NA 135 09/20/2023   K 4.5 09/20/2023   CL 103 09/20/2023   CREATININE 1.07 (H) 09/20/2023   BUN 14 09/20/2023  CO2 19 (L) 09/20/2023   TSH 2.240 02/02/2023   HGBA1C 6.2 (H) 09/20/2023      Assessment & Plan:  Primary hypertension -     CBC with Differential/Platelet -     Comprehensive metabolic panel with GFR  Gastroesophageal reflux disease with esophagitis without hemorrhage  Hypothyroidism, adult  Controlled type 2 diabetes mellitus with complication, without long-term current use of insulin (HCC) -     Hemoglobin A1c  Mixed hyperlipidemia -     Lipid panel    Assessment and Plan    Anemia Anemia persists due to lack of iron  supplementation. - Order CBC to assess current anemia status.  Allergic Rhinitis Persistent rhinorrhea despite Flonase  and Claritin  use.  Corn on Right Second Toe Corn causing soreness, partially relieved by corn pad. - Advise soaking and peeling the corn at home. - Apply another corn pad if the corn does not peel off. - Use gel cushioning between toes once healed.  General Health Maintenance On multiple medications for chronic conditions and vitamin D  for deficiency. - Follow up in mid-October for influenza vaccination and routine check-up.  Recording duration: 8 minutes        No orders of the defined types were placed in this encounter.   Orders Placed This Encounter  Procedures   CBC with Differential/Platelet   Comprehensive metabolic panel with GFR   Hemoglobin A1c   Lipid panel     Follow-up: Return in about 3 months (around 01/01/2024) for chronic follow up.   I,Amber Brown,acting as a scribe for Abigail Free, MD.,have documented all relevant documentation on the behalf of Abigail Free, MD,as directed by  Abigail Free, MD while in the presence of Abigail Free, MD.   An After Visit Summary was printed and given to the patient.  Abigail Free, MD [facility] [phone]

## 2023-09-20 ENCOUNTER — Ambulatory Visit: Admitting: Family Medicine

## 2023-09-20 ENCOUNTER — Encounter: Payer: Self-pay | Admitting: Family Medicine

## 2023-09-20 VITALS — BP 110/70 | HR 87 | Temp 97.8°F | Ht 61.5 in | Wt 135.0 lb

## 2023-09-20 DIAGNOSIS — I10 Essential (primary) hypertension: Secondary | ICD-10-CM | POA: Diagnosis not present

## 2023-09-20 DIAGNOSIS — F0390 Unspecified dementia without behavioral disturbance: Secondary | ICD-10-CM

## 2023-09-20 DIAGNOSIS — E118 Type 2 diabetes mellitus with unspecified complications: Secondary | ICD-10-CM

## 2023-09-20 DIAGNOSIS — E039 Hypothyroidism, unspecified: Secondary | ICD-10-CM | POA: Diagnosis not present

## 2023-09-20 DIAGNOSIS — D508 Other iron deficiency anemias: Secondary | ICD-10-CM

## 2023-09-20 DIAGNOSIS — K2100 Gastro-esophageal reflux disease with esophagitis, without bleeding: Secondary | ICD-10-CM | POA: Diagnosis not present

## 2023-09-20 DIAGNOSIS — J301 Allergic rhinitis due to pollen: Secondary | ICD-10-CM

## 2023-09-20 DIAGNOSIS — E782 Mixed hyperlipidemia: Secondary | ICD-10-CM | POA: Diagnosis not present

## 2023-09-20 DIAGNOSIS — L84 Corns and callosities: Secondary | ICD-10-CM

## 2023-09-20 LAB — CBC WITH DIFFERENTIAL/PLATELET
Basophils Absolute: 0 10*3/uL (ref 0.0–0.2)
Basos: 1 %
EOS (ABSOLUTE): 0.1 10*3/uL (ref 0.0–0.4)
Eos: 1 %
Hematocrit: 39.8 % (ref 34.0–46.6)
Hemoglobin: 12.3 g/dL (ref 11.1–15.9)
Immature Grans (Abs): 0 10*3/uL (ref 0.0–0.1)
Immature Granulocytes: 0 %
Lymphocytes Absolute: 2.4 10*3/uL (ref 0.7–3.1)
Lymphs: 27 %
MCH: 25.9 pg — ABNORMAL LOW (ref 26.6–33.0)
MCHC: 30.9 g/dL — ABNORMAL LOW (ref 31.5–35.7)
MCV: 84 fL (ref 79–97)
Monocytes Absolute: 0.9 10*3/uL (ref 0.1–0.9)
Monocytes: 11 %
Neutrophils Absolute: 5.4 10*3/uL (ref 1.4–7.0)
Neutrophils: 60 %
Platelets: 349 10*3/uL (ref 150–450)
RBC: 4.75 x10E6/uL (ref 3.77–5.28)
RDW: 16.1 % — ABNORMAL HIGH (ref 11.7–15.4)
WBC: 8.9 10*3/uL (ref 3.4–10.8)

## 2023-09-20 LAB — COMPREHENSIVE METABOLIC PANEL WITH GFR
ALT: 16 IU/L (ref 0–32)
AST: 21 IU/L (ref 0–40)
Albumin: 4 g/dL (ref 3.6–4.6)
Alkaline Phosphatase: 75 IU/L (ref 44–121)
BUN/Creatinine Ratio: 13 (ref 12–28)
BUN: 14 mg/dL (ref 10–36)
Bilirubin Total: 0.4 mg/dL (ref 0.0–1.2)
CO2: 19 mmol/L — ABNORMAL LOW (ref 20–29)
Calcium: 9.5 mg/dL (ref 8.7–10.3)
Chloride: 103 mmol/L (ref 96–106)
Creatinine, Ser: 1.07 mg/dL — ABNORMAL HIGH (ref 0.57–1.00)
Globulin, Total: 2.4 g/dL (ref 1.5–4.5)
Glucose: 140 mg/dL — ABNORMAL HIGH (ref 70–99)
Potassium: 4.5 mmol/L (ref 3.5–5.2)
Sodium: 135 mmol/L (ref 134–144)
Total Protein: 6.4 g/dL (ref 6.0–8.5)
eGFR: 48 mL/min/{1.73_m2} — ABNORMAL LOW (ref >59)

## 2023-09-20 LAB — HEMOGLOBIN A1C
Est. average glucose Bld gHb Est-mCnc: 131 mg/dL
Hgb A1c MFr Bld: 6.2 % — ABNORMAL HIGH (ref 4.8–5.6)

## 2023-09-20 LAB — LIPID PANEL
Chol/HDL Ratio: 2.2 ratio (ref 0.0–4.4)
Cholesterol, Total: 125 mg/dL (ref 100–199)
HDL: 56 mg/dL (ref >39)
LDL Chol Calc (NIH): 51 mg/dL (ref 0–99)
Triglycerides: 94 mg/dL (ref 0–149)
VLDL Cholesterol Cal: 18 mg/dL (ref 5–40)

## 2023-09-21 ENCOUNTER — Ambulatory Visit: Payer: Self-pay | Admitting: Family Medicine

## 2023-09-24 ENCOUNTER — Encounter: Payer: Self-pay | Admitting: Family Medicine

## 2023-09-24 DIAGNOSIS — F0390 Unspecified dementia without behavioral disturbance: Secondary | ICD-10-CM | POA: Insufficient documentation

## 2023-09-24 DIAGNOSIS — L84 Corns and callosities: Secondary | ICD-10-CM | POA: Insufficient documentation

## 2023-09-24 NOTE — Assessment & Plan Note (Signed)
 Corn on Right Second Toe Corn causing soreness, partially relieved by corn pad. - Advise soaking and peeling the corn at home. - Apply another corn pad if the corn does not peel off. - Use gel cushioning between toes once healed.

## 2023-09-24 NOTE — Assessment & Plan Note (Signed)
 Cholesterol levels are well-controlled on atorvastatin.

## 2023-09-24 NOTE — Assessment & Plan Note (Signed)
 The current medical regimen is effective;  continue present plan and medications.  On Protonix 40 mg daily.

## 2023-09-24 NOTE — Assessment & Plan Note (Signed)
 Anemia persists due to lack of iron supplementation. - Order CBC to assess current anemia status.

## 2023-09-24 NOTE — Assessment & Plan Note (Addendum)
 Diabetes well controlled.  Complications include hypertension and hyperlipidemia. Recent hemoglobin A1c of 6.2%. No aggressive treatment planned at this time due to stable levels. . - Monitor hemoglobin A1c levels regularly. - Ensure regular foot examinations and educate on self-monitoring for sores.

## 2023-09-24 NOTE — Assessment & Plan Note (Signed)
 Blood pressure is well-controlled on current regimen of Metoprolol  50 mg daily, Losartan  100 mg daily. Check labs.

## 2023-09-24 NOTE — Assessment & Plan Note (Signed)
 Well-managed on levothyroxine 50 mcg daily.

## 2023-09-24 NOTE — Assessment & Plan Note (Signed)
 Prefers no treatment per family.

## 2023-09-24 NOTE — Assessment & Plan Note (Signed)
 Persistent rhinorrhea despite Flonase  and Claritin  use.

## 2023-09-25 ENCOUNTER — Other Ambulatory Visit: Payer: Self-pay

## 2023-09-25 ENCOUNTER — Other Ambulatory Visit

## 2023-09-25 DIAGNOSIS — N289 Disorder of kidney and ureter, unspecified: Secondary | ICD-10-CM

## 2023-09-26 ENCOUNTER — Ambulatory Visit: Payer: Self-pay | Admitting: Family Medicine

## 2023-09-26 LAB — CMP14+EGFR
ALT: 16 IU/L (ref 0–32)
AST: 21 IU/L (ref 0–40)
Albumin: 3.8 g/dL (ref 3.6–4.6)
Alkaline Phosphatase: 81 IU/L (ref 44–121)
BUN/Creatinine Ratio: 17 (ref 12–28)
BUN: 15 mg/dL (ref 10–36)
Bilirubin Total: 0.5 mg/dL (ref 0.0–1.2)
CO2: 19 mmol/L — ABNORMAL LOW (ref 20–29)
Calcium: 9 mg/dL (ref 8.7–10.3)
Chloride: 104 mmol/L (ref 96–106)
Creatinine, Ser: 0.89 mg/dL (ref 0.57–1.00)
Globulin, Total: 2.2 g/dL (ref 1.5–4.5)
Glucose: 110 mg/dL — ABNORMAL HIGH (ref 70–99)
Potassium: 4.5 mmol/L (ref 3.5–5.2)
Sodium: 137 mmol/L (ref 134–144)
Total Protein: 6 g/dL (ref 6.0–8.5)
eGFR: 60 mL/min/1.73 (ref >59)

## 2023-10-11 ENCOUNTER — Other Ambulatory Visit: Payer: Self-pay | Admitting: Family Medicine

## 2023-10-11 ENCOUNTER — Telehealth: Payer: Self-pay

## 2023-10-11 DIAGNOSIS — F0390 Unspecified dementia without behavioral disturbance: Secondary | ICD-10-CM

## 2023-10-11 DIAGNOSIS — E118 Type 2 diabetes mellitus with unspecified complications: Secondary | ICD-10-CM

## 2023-10-11 NOTE — Telephone Encounter (Unsigned)
 Copied from CRM #[id number]. Topic: Clinical - Home Health Verbal Orders >> Oct 11, 2023  2:05 PM Willma SAUNDERS wrote: Caller/Agency: [facility] / [facility] Number: [phone] Service Requested: Palliative Care Frequency: PRN as needed Any new concerns about the patient? No

## 2023-10-11 NOTE — Telephone Encounter (Signed)
 Referral ordered. Dr. Sherre

## 2023-10-11 NOTE — Telephone Encounter (Signed)
 Patient wants to do palliative care and they will need a order faxed to [phone]

## 2023-10-13 DIAGNOSIS — M533 Sacrococcygeal disorders, not elsewhere classified: Secondary | ICD-10-CM | POA: Diagnosis not present

## 2023-10-13 DIAGNOSIS — M4120 Other idiopathic scoliosis, site unspecified: Secondary | ICD-10-CM | POA: Diagnosis not present

## 2023-10-23 ENCOUNTER — Other Ambulatory Visit: Payer: Self-pay | Admitting: Family Medicine

## 2023-10-23 DIAGNOSIS — E039 Hypothyroidism, unspecified: Secondary | ICD-10-CM

## 2023-11-03 ENCOUNTER — Telehealth: Payer: Self-pay | Admitting: Family Medicine

## 2023-11-03 DIAGNOSIS — I129 Hypertensive chronic kidney disease with stage 1 through stage 4 chronic kidney disease, or unspecified chronic kidney disease: Secondary | ICD-10-CM | POA: Diagnosis not present

## 2023-11-03 DIAGNOSIS — E785 Hyperlipidemia, unspecified: Secondary | ICD-10-CM | POA: Diagnosis not present

## 2023-11-03 DIAGNOSIS — N1832 Chronic kidney disease, stage 3b: Secondary | ICD-10-CM | POA: Diagnosis not present

## 2023-11-03 NOTE — Telephone Encounter (Unsigned)
 Copied from CRM [id number]. Topic: Clinical - Medication Question >> Nov 03, 2023 12:00 PM Zy'onna H wrote: Reason for CRM:  Patient representative: Hendricks Balloon calling alongside Nurse from [facility] regarding patient being taken off of the following Rx:   pantoprazole  (PROTONIX ) 40 MG tablet atorvastatin  (LIPITOR) 40 MG tablet  [facility] nurse, and representative feel these Rx are no longer needed for Amber Brown at this time, but want to clarify with the clinic.   Please advise.    Hendricks Balloon Callback: [phone]

## 2023-11-06 NOTE — Telephone Encounter (Signed)
 Medications below has been taken off medication list.

## 2023-11-06 NOTE — Telephone Encounter (Signed)
 Spoke with Macario, she stated patient is not on hospice care, she is on palliative care, she does not need hospice care at this time.

## 2023-11-16 ENCOUNTER — Telehealth: Payer: Self-pay | Admitting: Family Medicine

## 2023-11-16 NOTE — Telephone Encounter (Signed)
 Palliative Home Provider Note

## 2023-11-22 DIAGNOSIS — M1990 Unspecified osteoarthritis, unspecified site: Secondary | ICD-10-CM | POA: Diagnosis not present

## 2023-11-22 DIAGNOSIS — G4489 Other headache syndrome: Secondary | ICD-10-CM | POA: Diagnosis not present

## 2023-11-22 DIAGNOSIS — R531 Weakness: Secondary | ICD-10-CM | POA: Diagnosis not present

## 2023-11-22 DIAGNOSIS — Z8673 Personal history of transient ischemic attack (TIA), and cerebral infarction without residual deficits: Secondary | ICD-10-CM | POA: Diagnosis not present

## 2023-11-22 DIAGNOSIS — I4510 Unspecified right bundle-branch block: Secondary | ICD-10-CM | POA: Diagnosis not present

## 2023-11-22 DIAGNOSIS — K219 Gastro-esophageal reflux disease without esophagitis: Secondary | ICD-10-CM | POA: Diagnosis not present

## 2023-11-22 DIAGNOSIS — D649 Anemia, unspecified: Secondary | ICD-10-CM | POA: Diagnosis not present

## 2023-11-22 DIAGNOSIS — R42 Dizziness and giddiness: Secondary | ICD-10-CM | POA: Diagnosis not present

## 2023-11-22 DIAGNOSIS — R9431 Abnormal electrocardiogram [ECG] [EKG]: Secondary | ICD-10-CM | POA: Diagnosis not present

## 2023-11-22 DIAGNOSIS — I10 Essential (primary) hypertension: Secondary | ICD-10-CM | POA: Diagnosis not present

## 2023-11-26 ENCOUNTER — Other Ambulatory Visit: Payer: Self-pay | Admitting: Family Medicine

## 2023-11-26 DIAGNOSIS — I739 Peripheral vascular disease, unspecified: Secondary | ICD-10-CM

## 2023-11-27 ENCOUNTER — Telehealth: Payer: Self-pay

## 2023-11-27 NOTE — Telephone Encounter (Signed)
-----   Message from Abigail Free sent at 11/27/2023 12:13 AM EDT ----- Regarding: needs ed follow up this week. needs ed follow up this week. Dr. Free ----- Message ----- From: Rodolph Waddell CROME Sent: 11/23/2023   8:58 AM EDT To: Abigail Free, MD

## 2023-11-27 NOTE — Telephone Encounter (Signed)
 Called patient and she stated she will call back to make an appointment she need to see who would be driving her first.

## 2023-11-28 ENCOUNTER — Ambulatory Visit: Payer: Self-pay

## 2023-11-28 ENCOUNTER — Ambulatory Visit: Admitting: Family Medicine

## 2023-11-28 ENCOUNTER — Encounter: Payer: Self-pay | Admitting: Family Medicine

## 2023-11-28 VITALS — BP 186/90 | HR 59 | Temp 97.4°F | Resp 16 | Ht 61.5 in | Wt 131.0 lb

## 2023-11-28 DIAGNOSIS — R413 Other amnesia: Secondary | ICD-10-CM

## 2023-11-28 DIAGNOSIS — I160 Hypertensive urgency: Secondary | ICD-10-CM

## 2023-11-28 DIAGNOSIS — S0990XA Unspecified injury of head, initial encounter: Secondary | ICD-10-CM

## 2023-11-28 LAB — POCT URINALYSIS DIP (CLINITEK)
Bilirubin, UA: NEGATIVE
Blood, UA: NEGATIVE
Glucose, UA: NEGATIVE mg/dL
Ketones, POC UA: NEGATIVE mg/dL
Leukocytes, UA: NEGATIVE
Nitrite, UA: NEGATIVE
POC PROTEIN,UA: NEGATIVE
Spec Grav, UA: 1.01 (ref 1.010–1.025)
Urobilinogen, UA: 0.2 U/dL
pH, UA: 7 (ref 5.0–8.0)

## 2023-11-28 MED ORDER — VALSARTAN-HYDROCHLOROTHIAZIDE 160-25 MG PO TABS: 1.0000 | ORAL_TABLET | Freq: Every day | ORAL | 0 refills | Status: DC

## 2023-11-28 NOTE — Progress Notes (Signed)
 Subjective:  Patient ID: Amber Brown, female    DOB: 14-Sep-1928  Age: 89 y.o. MRN: [id number]  Chief Complaint  Patient presents with   Memory Loss    HPI: Discussed the use of AI scribe software for clinical note transcription with the patient, who gave verbal consent to proceed.  History of Present Illness Amber Brown is a 89 year old female with hypertension who presents with confusion and high blood pressure.  Cognitive impairment and disorientation - Confusion and disorientation have become more pronounced over the past two weeks - Difficulty recalling recent events and identifying familiar people, including mistaking the doctor for a nurse - Difficulty remembering the current date and location - Not sleeping well, attributed to spending nights at different locations and confusion about her living situation  Hypertension and associated symptoms - Elevated blood pressure with recent systolic reading of 170 mmHg - Currently taking losartan  100 mg once daily and Toprol  XL 50 mg once daily - Clonidine  taken as needed when systolic blood pressure exceeds 170 mmHg - Occasional headaches - No chest pain or breathing problems  History of cerebrovascular disease - History of stroke identified on MRI two years ago, described as chronic - No recent brain imaging - No recent falls - Bruises on back and face attributed to minor incidents such as bumping into a door  Constitutional and other symptoms (ROS) - No fevers, chills, or sweats - No earaches, sore throat, or stuffy nose - No bowel or bladder issues       09/20/2023    9:27 AM 06/15/2023    9:33 AM 11/24/2022    9:19 AM 10/21/2022    9:26 AM 05/31/2022   11:14 AM  Depression screen PHQ 2/9  Decreased Interest 0 0 0 0 0  Down, Depressed, Hopeless 0 0 0 0 0  PHQ - 2 Score 0 0 0 0 0  Altered sleeping  0     Tired, decreased energy  0     Change in appetite  0     Feeling bad or failure about yourself   0     Trouble  concentrating  0     Moving slowly or fidgety/restless  0     Suicidal thoughts  0     PHQ-9 Score  0     Difficult doing work/chores  Not difficult at all           09/20/2023    9:27 AM  Fall Risk   Falls in the past year? 1  Number falls in past yr: 0  Injury with Fall? 0  Risk for fall due to : Impaired mobility;Impaired vision  Follow up Falls evaluation completed    Patient Care Team: Quanta Robertshaw, Abigail, MD as PCP - General (Family Medicine)    Current Outpatient Medications on File Prior to Visit  Medication Sig Dispense Refill   brimonidine (ALPHAGAN) 0.2 % ophthalmic solution Place 1 drop into both eyes 2 (two) times daily.     clopidogrel  (PLAVIX ) 75 MG tablet TAKE 1 TABLET BY MOUTH DAILY 100 tablet 2   dorzolamide -timolol  (COSOPT ) 22.3-6.8 MG/ML ophthalmic solution Place 1 drop into both eyes 2 (two) times daily.      ferrous sulfate  325 (65 FE) MG EC tablet Take 325 mg by mouth daily with breakfast.     fluticasone  (FLONASE ) 50 MCG/ACT nasal spray Place 2 sprays into both nostrils daily. 16 g 6   levothyroxine  (SYNTHROID ) 50 MCG tablet TAKE 1  TABLET BY MOUTH DAILY 100 tablet 2   loratadine  (CLARITIN ) 10 MG tablet Take 1 tablet (10 mg total) by mouth daily. 30 tablet 11   metoprolol  succinate (TOPROL -XL) 50 MG 24 hr tablet TAKE 1 TABLET BY MOUTH DAILY  WITH OR IMMEDIATELY FOLLOWING A  MEAL 100 tablet 2   Multiple Vitamins-Minerals (OCUVITE ADULT FORMULA PO) Take 1 tablet by mouth daily.     ROCKLATAN  0.02-0.005 % SOLN Place 1 drop into both eyes at bedtime.     SSD 1 % cream Apply topically daily as needed (abrasions). 50 g 1   Vitamin D , Ergocalciferol , (DRISDOL ) 1.25 MG (50000 UNIT) CAPS capsule TAKE 1 CAPSULE BY MOUTH EVERY 7 DAYS 5 capsule 5   No current facility-administered medications on file prior to visit.   Past Medical History:  Diagnosis Date   Anemia in stage 3b chronic kidney disease (HCC) 05/28/2019   Arthritis    Degenerative scoliosis in adult patient  01/06/2015   GERD with esophagitis 05/28/2019   Glaucoma 02/20/2017   History of kidney stones    Hypertension 02/20/2017   Hypothyroidism, adult 05/28/2019   Mixed hyperlipidemia 05/28/2019   Osteoporosis of forearm without pathological fracture 05/28/2019   Peripheral vascular disease (HCC) 05/28/2019   Sequelae of cerebral infarction 05/28/2019   Stroke [facility])    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   Past Surgical History:  Procedure Laterality Date   ABDOMINAL HYSTERECTOMY     CHOLECYSTECTOMY     TOTAL KNEE ARTHROPLASTY Right 12/21/2020   Procedure: TOTAL KNEE ARTHROPLASTY;  Surgeon: Rubie Kemps, MD;  Location: [facility];  Service: Orthopedics;  Laterality: Right;    Family History  Problem Relation Age of Onset   Aneurysm Mother    Bone cancer Father    Heart Problems Brother    Alcohol abuse Brother    Lung cancer Brother    Social History   Socioeconomic History   Marital status: Widowed    Spouse name: Not on file   Number of children: Not on file   Years of education: Not on file   Highest education level: Not on file  Occupational History   Not on file  Tobacco Use   Smoking status: Never   Smokeless tobacco: Never  Vaping Use   Vaping status: Never Used  Substance and Sexual Activity   Alcohol use: Never   Drug use: Never   Sexual activity: Not Currently  Other Topics Concern   Not on file  Social History Narrative   Not on file   Social Drivers of Health   Financial Resource Strain: Low Risk  (11/24/2022)   Overall Financial Resource Strain (CARDIA)    Difficulty of Paying Living Expenses: Not hard at all  Food Insecurity: No Food Insecurity (09/20/2023)   Hunger Vital Sign    Worried About Running Out of Food in the Last Year: Never true    Ran Out of Food in the Last Year: Never true  Transportation Needs: No Transportation Needs (09/20/2023)   PRAPARE - Administrator, Civil Service (Medical): No    Lack of Transportation (Non-Medical): No   Physical Activity: Inactive (11/24/2022)   Exercise Vital Sign    Days of Exercise per Week: 0 days    Minutes of Exercise per Session: 0 min  Stress: No Stress Concern Present (11/24/2022)   [employer] of Occupational Health - Occupational Stress Questionnaire    Feeling of Stress : Not at all  Social Connections:  Moderately Isolated (11/24/2022)   Social Connection and Isolation Panel    Frequency of Communication with Friends and Family: More than three times a week    Frequency of Social Gatherings with Friends and Family: Twice a week    Attends Religious Services: More than 4 times per year    Active Member of [employer] or Organizations: No    Attends Banker Meetings: Never    Marital Status: Widowed    Objective:  BP (!) 186/90   Pulse (!) 59   Temp (!) 97.4 F (36.3 C)   Resp 16   Ht 5' 1.5 (1.562 m)   Wt 131 lb (59.4 kg)   LMP  (LMP Unknown)   SpO2 98%   BMI 24.35 kg/m      11/28/2023    4:53 PM 11/28/2023    4:05 PM 09/20/2023    9:23 AM  BP/Weight  Systolic BP 186 180 110  Diastolic BP 90 80 70  Wt. (Lbs)  131 135  BMI  24.35 kg/m2 25.09 kg/m2    Physical Exam Vitals reviewed.  Constitutional:      Appearance: Normal appearance. She is normal weight.  Neck:     Vascular: No carotid bruit.  Cardiovascular:     Rate and Rhythm: Normal rate and regular rhythm.     Heart sounds: Normal heart sounds.  Pulmonary:     Effort: Pulmonary effort is normal. No respiratory distress.     Breath sounds: Normal breath sounds.  Abdominal:     General: Abdomen is flat. Bowel sounds are normal.     Palpations: Abdomen is soft.     Tenderness: There is no abdominal tenderness.  Neurological:     Mental Status: She is alert.  Psychiatric:        Mood and Affect: Mood normal.        Behavior: Behavior normal.         Lab Results  Component Value Date   WBC 8.9 09/20/2023   HGB 12.3 09/20/2023   HCT 39.8 09/20/2023   PLT 349 09/20/2023   GLUCOSE  110 (H) 09/25/2023   CHOL 125 09/20/2023   TRIG 94 09/20/2023   HDL 56 09/20/2023   LDLCALC 51 09/20/2023   ALT 16 09/25/2023   AST 21 09/25/2023   NA 137 09/25/2023   K 4.5 09/25/2023   CL 104 09/25/2023   CREATININE 0.89 09/25/2023   BUN 15 09/25/2023   CO2 19 (L) 09/25/2023   TSH 3.780 11/28/2023   HGBA1C 6.2 (H) 09/20/2023      Assessment & Plan:  Memory loss Assessment & Plan: Acute cognitive decline. Previous MRI indicated stroke. - Ordered blood tests: B12, folate, MMA, TSH. - Scheduled CT scan of the brain.  Orders: -     POCT URINALYSIS DIP (CLINITEK) -     CT HEAD WO CONTRAST ( ); Future -     B12 and Folate Panel -     TSH -     Methylmalonic acid, serum  Traumatic injury of head, initial encounter Assessment & Plan: Head injury with bruising and cognitive symptoms. Risk of intracranial injury due to mechanism and history of falls. - Ordered CT scan of the brain  Orders: -     POCT URINALYSIS DIP (CLINITEK)  Hypertensive urgency Assessment & Plan: Symptomatic blood pressure elevation with headaches and disorientation. Current regimen includes losartan , Toprol  XL, and PRN clonidine . Immediate management required to prevent complications. - Administered clonidine  to lower blood  pressure. STOP LOSARTAN . START ON VALSARTAN  160/25 MG ONCE DAILY. CONTINUE METOPROLOL  XL 50 MG every day.  CONTINUE CLONIDINE  0.1 MG TWICE A DAY AS NEEDED IF SYSTOLIC BP (UPPER NUMBER) IS > 160 AND DIASTOLIC BP (LOWER NUMBER) IS >09.  Orders: -     Valsartan -hydroCHLOROthiazide ; Take 1 tablet by mouth daily.  Dispense: 90 tablet; Refill: 0      Meds ordered this encounter  Medications   valsartan -hydrochlorothiazide  (DIOVAN -HCT) 160-25 MG tablet    Sig: Take 1 tablet by mouth daily.    Dispense:  90 tablet    Refill:  0    Orders Placed This Encounter  Procedures   CT HEAD WO CONTRAST ( )   B12 and Folate Panel   TSH   Methylmalonic acid, serum   POCT URINALYSIS  DIP (CLINITEK)     Follow-up: Return in about 3 weeks (around 12/19/2023).   An After Visit Summary was printed and given to the patient.  Abigail Free, MD [facility] [phone]

## 2023-11-28 NOTE — Telephone Encounter (Signed)
 FYI Only or Action Required?: FYI only for provider.  Patient was last seen in primary care on 09/20/2023 by Sherre Clapper, MD.  Called Nurse Triage reporting Altered Mental Status.  Symptoms began several weeks ago.  Interventions attempted: Other: frequent reminding/redirection.  Symptoms are: gradually worsening.  Triage Disposition: See HCP Within 4 Hours (Or PCP Triage)  Patient/caregiver understands and will follow disposition?: Yes      Copied from CRM #[id number]. Topic: Clinical - Red Word Triage >> Nov 28, 2023 10:59 AM Avram MATSU wrote: Red Word that prompted transfer to Nurse Triage: confusion patient lives alone Reason for Disposition  [1] Longstanding confusion (e.g., dementia, stroke) AND [2] getting worse  Answer Assessment - Initial Assessment Questions 1. LEVEL OF CONSCIOUSNESS: How are they (the patient) acting right now? (e.g., alert-oriented, confused, lethargic, stuporous, comatose)     Extreme confusion Endorses recent hospitalization for HTN at outside hospital, and was D/C home with new medications (clonidine 0.1 mg - by mouth twice a day PRN for DBP 170) - reports has been giving 1x daily for the past few days 2. ONSET: When did the confusion start?  (e.g., minutes, hours, days)     A while - a few weeks Worse when BP elevated 3. PATTERN: Does this come and go, or has it been constant since it started?  Is it present now?     intermittent 4. ALCOHOL or DRUGS: Have they been drinking alcohol or taking any drugs?      N/a 5. NARCOTIC MEDICINES: Have they been receiving any narcotic medications? (e.g., morphine , Vicodin)     denies 6. CAUSE: What do you think is causing the confusion?      Unknown, does endorse worse with HTN 7. OTHER SYMPTOMS: Are there any other symptoms? (e.g., difficulty breathing, fever, headache, weakness)     Denies Did endorse incontinence x 1 the other day  Protocols used: Confusion - Delirium-A-AH

## 2023-11-28 NOTE — Patient Instructions (Addendum)
  VISIT SUMMARY: Today, we addressed your confusion, high blood pressure, and recent head injury. We took immediate steps to manage your blood pressure and ordered tests to understand the cause of your cognitive decline.  YOUR PLAN: HYPERTENSIVE URGENCY WITH SYMPTOMATIC BLOOD PRESSURE ELEVATION: We gave you clonidine to lower your blood pressure which did not help.  STOP LOSARTAN . START ON VALSARTAN  160/25 MG ONCE DAILY. CONTINUE METOPROLOL  XL 50 MG every day.  CONTINUE CLONIDINE 0.1 MG TWICE A DAY AS NEEDED IF SYSTOLIC BP (UPPER NUMBER) IS > 160 AND DIASTOLIC BP (LOWER NUMBER) IS >09. SABRA  ACUTE ON CHRONIC COGNITIVE IMPAIRMENT: You have been experiencing more confusion and memory problems recently. -We ordered blood tests to check your B12, folate, MMA, and TSH levels. -We scheduled a CT scan of your brain to look for any changes.  -ARRIVE AT 1:15 PM FOR CT SCAN OF BRAIN. [facility] [address] [place], [place] [address] [phone]  IMAGING 8-5 PM MONDAY THROUGH FRIDAY.    RECENT HEAD INJURY WITH SCALP AND FACIAL BRUISING: You have bruises on your head and face from minor incidents, and we need to make sure there is no serious injury. -We scheduled a CT scan of your brain to check for any injuries.                      Contains text generated by [employer]bridge.                                 Contains text generated by Abridge.

## 2023-11-29 ENCOUNTER — Ambulatory Visit: Payer: Self-pay | Admitting: Family Medicine

## 2023-11-29 ENCOUNTER — Ambulatory Visit (INDEPENDENT_AMBULATORY_CARE_PROVIDER_SITE_OTHER)
Admission: RE | Admit: 2023-11-29 | Discharge: 2023-11-29 | Disposition: A | Discharge status: Home / Self Care | Source: Ambulatory Visit | Attending: Family Medicine | Admitting: Family Medicine

## 2023-11-29 DIAGNOSIS — R413 Other amnesia: Secondary | ICD-10-CM

## 2023-11-29 DIAGNOSIS — S0990XA Unspecified injury of head, initial encounter: Secondary | ICD-10-CM | POA: Diagnosis not present

## 2023-11-29 DIAGNOSIS — I6782 Cerebral ischemia: Secondary | ICD-10-CM | POA: Diagnosis not present

## 2023-11-30 MED ORDER — AMOXICILLIN-POT CLAVULANATE 875-125 MG PO TABS
1.0000 | ORAL_TABLET | Freq: Two times a day (BID) | ORAL | 0 refills | Status: DC
Start: 2023-11-30 — End: 2023-12-15

## 2023-12-01 LAB — TSH: TSH: 3.78 u[IU]/mL (ref 0.450–4.500)

## 2023-12-01 LAB — B12 AND FOLATE PANEL
Folate: 9.4 ng/mL (ref >3.0)
Vitamin B-12: 322 pg/mL (ref 232–1245)

## 2023-12-01 LAB — METHYLMALONIC ACID, SERUM: Methylmalonic Acid: 245 nmol/L (ref 0–378)

## 2023-12-01 IMAGING — US US ABDOMEN LIMITED
1 series · 14 of 25 positions shown · non-contrast
Comparison: CT chest abdomen and pelvis 10/21/2013

CLINICAL DATA: Right upper quadrant pain.

EXAM:
ULTRASOUND ABDOMEN LIMITED RIGHT UPPER QUADRANT

[Series 1: us abdomen limited · 14 of 31 slices shown]
[im 1/31]
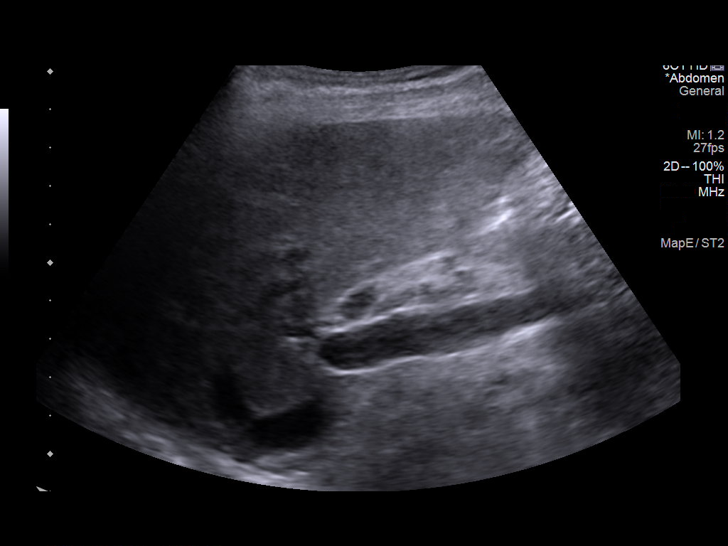
[im 3/31]
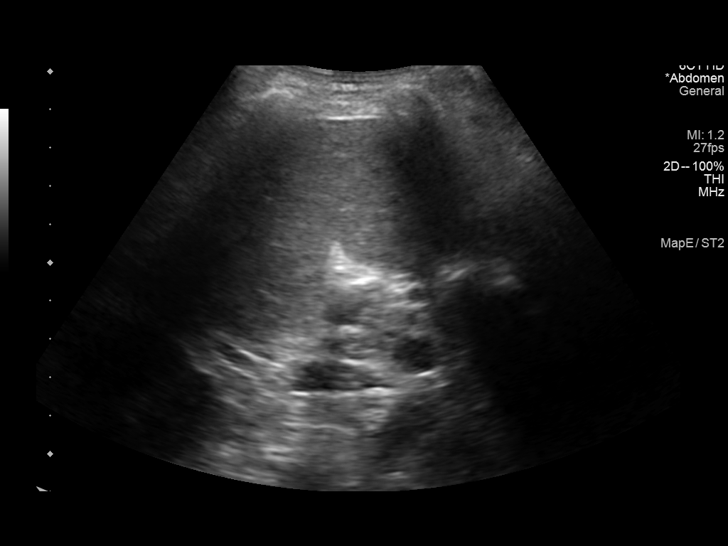
[im 6/31]
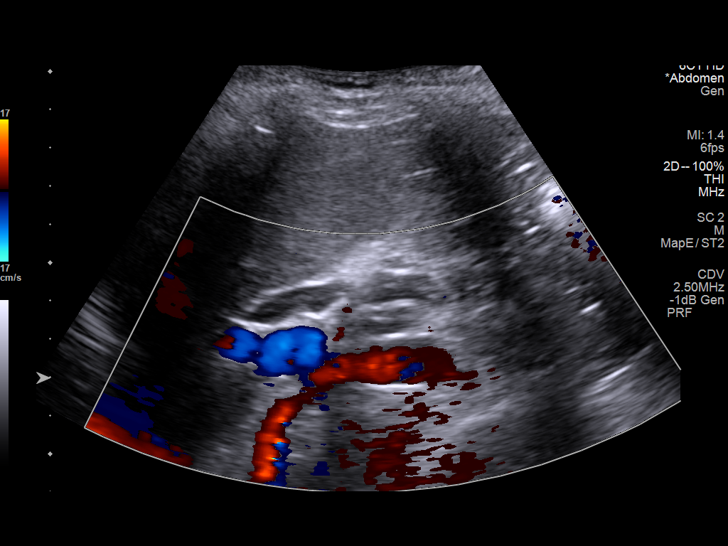
[im 8/31]
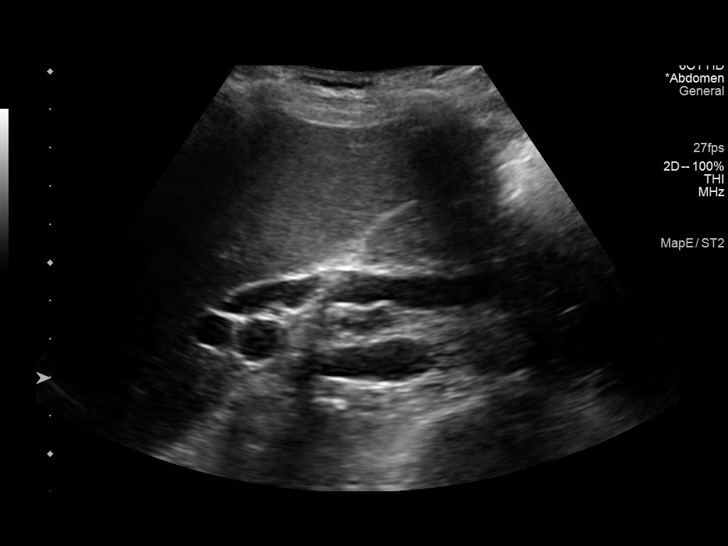
[im 11/31]
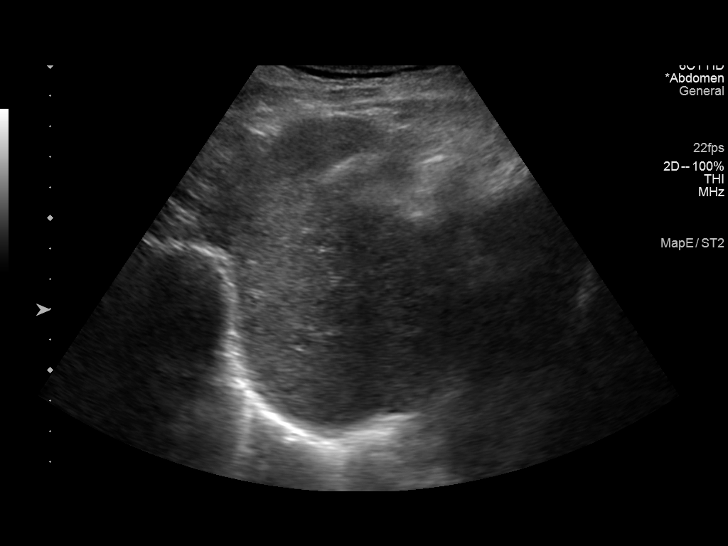
[im 12/31]
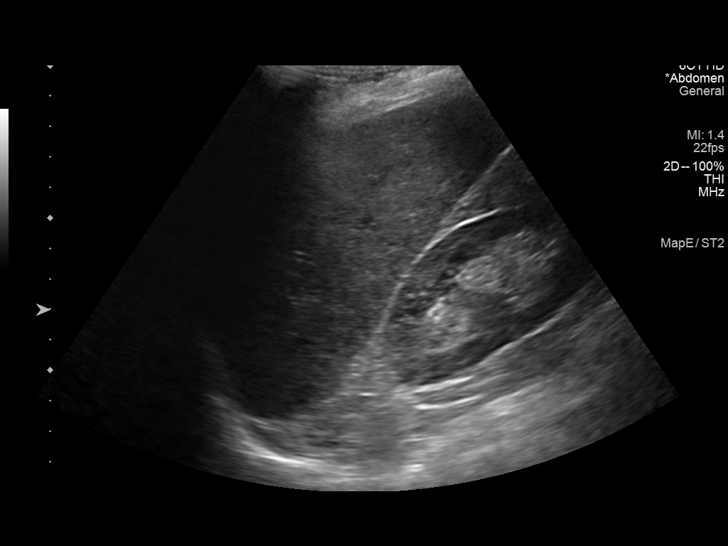
[im 14/31]
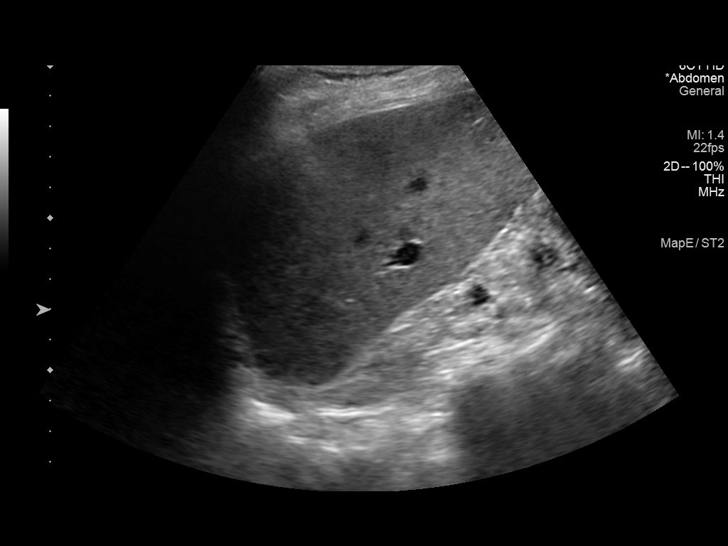
[im 17/31]
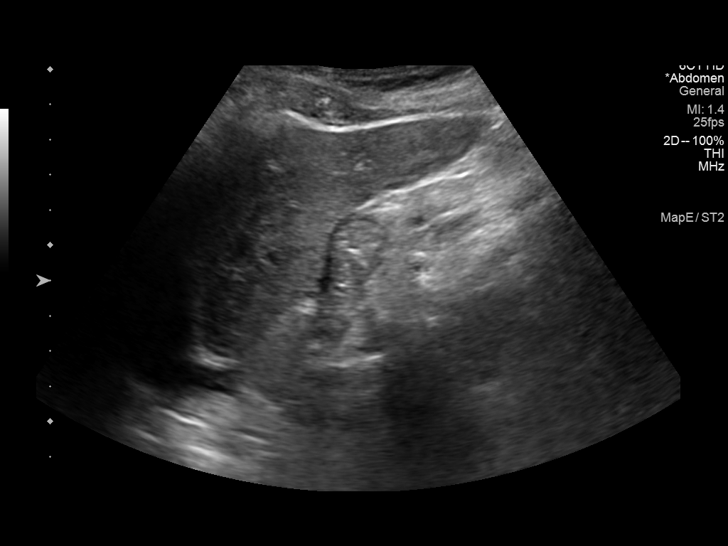
[im 19/31]
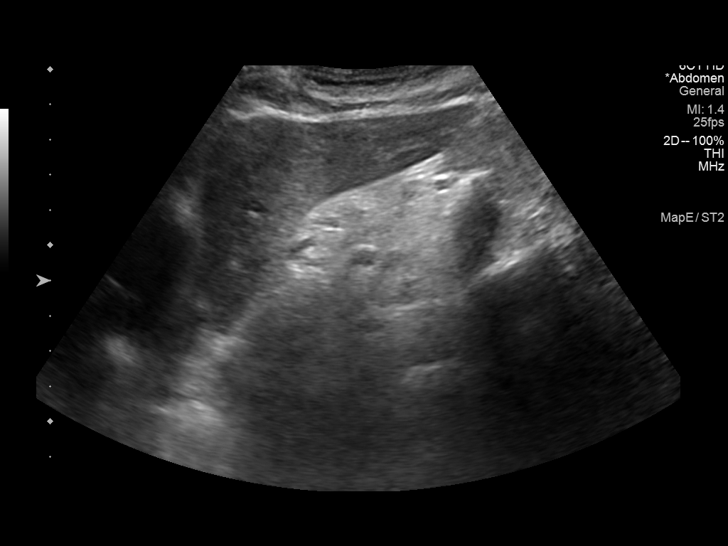
[im 21/31]
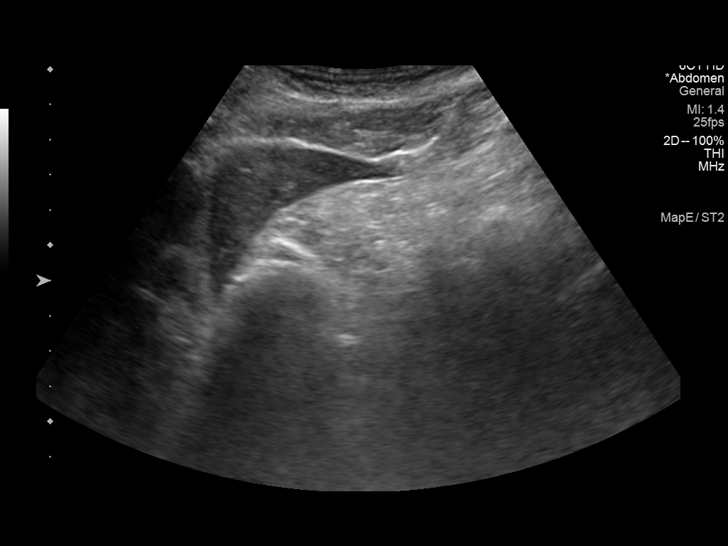
[im 23/31]
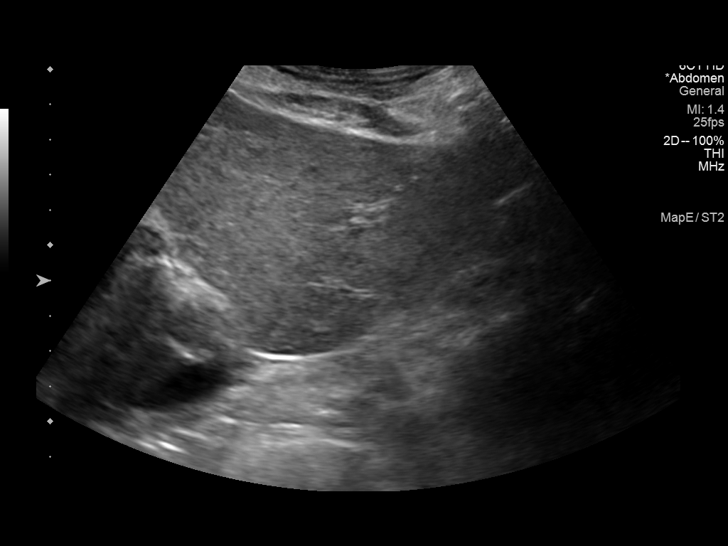
[im 26/31]
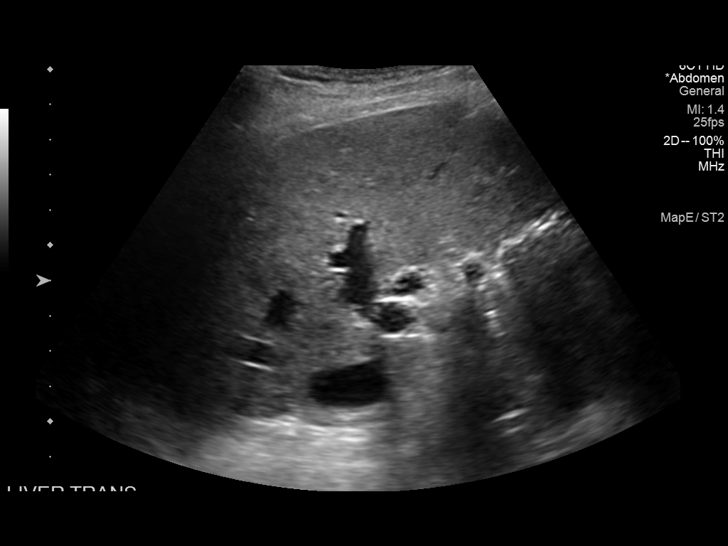
[im 28/31]
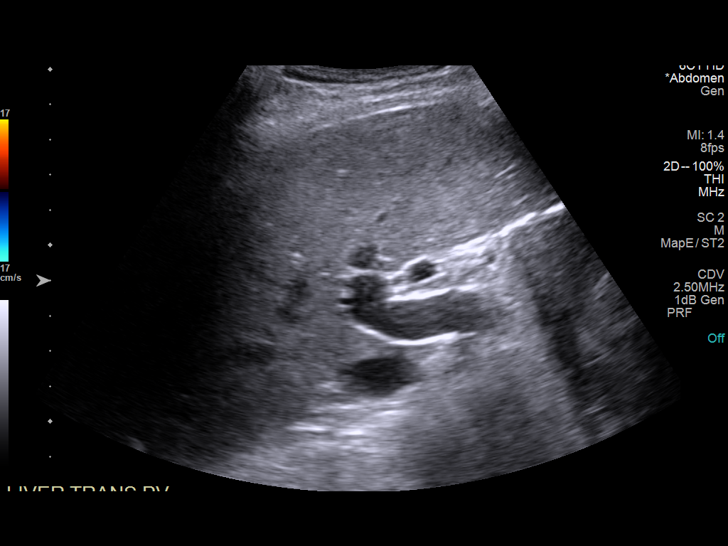
[im 31/31]
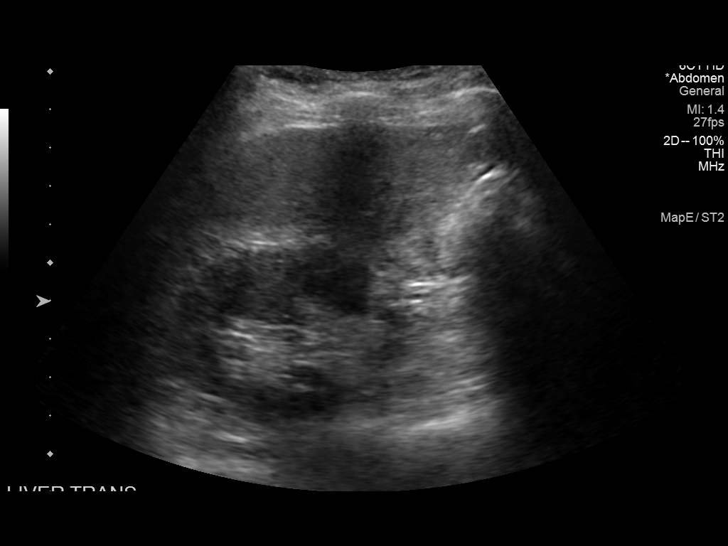

[14 of 25 positions shown; findings below may reference images not displayed]

FINDINGS: Gallbladder:

Surgically absent.

Common bile duct:

Diameter: 6.6 mm, decreased from prior CT.

Liver:

No focal lesion identified. Within normal limits in parenchymal
echogenicity. Portal vein is patent on color Doppler imaging with
normal direction of blood flow towards the liver.

Other: None.
IMPRESSION: 1. Cholecystectomy.  No biliary ductal dilatation.

## 2023-12-04 ENCOUNTER — Ambulatory Visit: Payer: Self-pay

## 2023-12-04 ENCOUNTER — Other Ambulatory Visit: Payer: Self-pay | Admitting: Family Medicine

## 2023-12-04 ENCOUNTER — Telehealth: Payer: Self-pay

## 2023-12-04 MED ORDER — MEMANTINE HCL 5 MG PO TABS: 5.0000 mg | ORAL_TABLET | Freq: Two times a day (BID) | ORAL | 1 refills | Status: DC

## 2023-12-04 MED ORDER — CLONIDINE HCL 0.1 MG PO TABS: 0.1000 mg | ORAL_TABLET | Freq: Two times a day (BID) | ORAL | 0 refills | Status: DC | PRN

## 2023-12-04 NOTE — Telephone Encounter (Signed)
 Next day appt scheduled for elevated BP, increased confusion and recent nocturnal urinary incontinence.   FYI Only or Action Required?: FYI only for provider.  Patient was last seen in primary care on 11/28/2023 by Sherre Clapper, MD.  Called Nurse Triage reporting Medication Refill and Hypertension.  Symptoms began several days ago.  Interventions attempted: Prescription medications: clonidine .  Symptoms are: unchanged.  Triage Disposition: See PCP When Office is Open (Within 3 Days)  Patient/caregiver understands and will follow disposition?: Yes Reason for Disposition  Systolic BP >= 160 OR Diastolic >= 100  Answer Assessment - Initial Assessment Questions Patient's niece reports that she has been having to give the patient her clonidine  almost daily recently d/t systolic readings >160. Also reports new onset of increased confusion. Neg UA 6 days ago, but is now having nocturnal urinary incontinence. Next day appt scheduled. Advised to go to the ED with onset of flank pain, nausea, vomiting, abdominal pain or fever.   1. BLOOD PRESSURE: What is your blood pressure? Did you take at least two measurements 5 minutes apart?     Running between 157/88-173/98  2. ONSET: When did you take your blood pressure?     2-3 weeks, seen in ED for same  3. HOW: How did you take your blood pressure? (e.g., automatic home BP monitor, visiting nurse)     Monitor  4. HISTORY: Do you have a history of high blood pressure?     Yes  Protocols used: Blood Pressure - High-A-AH Copied from CRM #[id number]. Topic: Clinical - Red Word Triage >> Dec 04, 2023  5:03 PM Tobias L wrote: Red Word that prompted transfer to Nurse Triage: niece just took patient's BP167/98 . BP keeps going up between 3:30pm - 5pm. Yesterday's reading were 173/98 & 157/88. Niece informed to give patient clonidine  when BP goes up, needing refill.

## 2023-12-04 NOTE — Telephone Encounter (Signed)
 I spoke with patient's daughter.  [facility] to get UA and Urine culture tomorrow.  I am sending namenda  5 mg twice daily  I have put aside rexulti titration to be picked up.  Her bp is still high SBP 170s.  Daughter is concerned it is because she is anxious.  Changes made last week to bp medicines.  Encouraged them to check bp twice daily and use as needed clonidine  as directed, as well as other medicines.  Sent refill of clonidine  to pharmacy.  Dr. Sherre

## 2023-12-04 NOTE — Telephone Encounter (Signed)
 Copied from CRM [id number]. Topic: General - Other >> Dec 04, 2023 10:09 AM Wess RAMAN wrote: Reason for CRM: Patient's niece Hendricks stated patient has declined greatly and would like to let Dr. Sherre know. Patient believes she is somewhere else and thinks she is at a nursing facility. Patient is not and is at home.  Patient is also urinating on herself and doesn't remember how to eat. [facility] also recommended patient be seen by a neurologist and they would like to know if it possible there was a false negative on the UTI.  Callback #: [phone]

## 2023-12-05 ENCOUNTER — Telehealth: Payer: Self-pay

## 2023-12-05 NOTE — Telephone Encounter (Unsigned)
 Copied from CRM #[id number]. Topic: Clinical - Medical Advice >> Dec 05, 2023 12:38 PM Dedra B wrote: Reason for CRM: Pt relative, Macario called to report that the care nurse didn't come. She wants to know if she just bring pt to office for urine sample tomorrow. If s, she prefers afternoon. Pls call Macario at [phone].

## 2023-12-05 NOTE — Telephone Encounter (Unsigned)
 Copied from CRM [id number]. Topic: General - Other >> Dec 05, 2023 12:05 PM Debby BROCKS wrote: Reason for CRM: Rilla from [facility] said that Dr. Sherre left a voicemail to see if it was possible to do a urine culture for the patient.   However, Rilla stated that they are not home health service so they cannot do the urine culture or send nurses either  Callback: [phone]

## 2023-12-05 NOTE — Telephone Encounter (Signed)
 Left message for patient daughter to call office back

## 2023-12-06 ENCOUNTER — Ambulatory Visit: Payer: Self-pay

## 2023-12-06 NOTE — Telephone Encounter (Signed)
 Left message advising that if patient is too weak to get up then they need to call an ambulance to transport patient to the ED. Advised if they had any other questions to call our office.

## 2023-12-06 NOTE — Telephone Encounter (Signed)
 FYI Only or Action Required?: Action required by provider: clinical question for provider.  Patient was last seen in primary care on 11/28/2023 by Sherre Clapper, MD.  Called Nurse Triage reporting Nausea.  Symptoms began several days ago.  Interventions attempted: Nothing.  Symptoms are: gradually worsening.  Triage Disposition: Call PCP Within 24 Hours  Patient/caregiver understands and will follow disposition?: UnsureCopied from CRM #[id number]. Topic: Clinical - Red Word Triage >> Dec 06, 2023 10:58 AM Tiffini S wrote: [facility] that prompted transfer to Nurse Triage: Provider give the patient some samples to take and she Is nauseous, very weak and can barely walk  Patient is having symptoms for UTI Reason for Disposition  Taking prescription medication that could cause nausea (e.g., narcotics/opiates, antibiotics, OCPs, many others)  Answer Assessment - Initial Assessment Questions Macario called to report Increase Nausea-comes in waves. Hendricks also stated that care connection still hasn't come to get urine sample. Temp 99 this morning. Dr Sherre told me to call if we didn't see them for sample.  [facility] notified of urgency. Please advise   1. NAUSEA SEVERITY: How bad is the nausea? (e.g., mild, moderate, severe; dehydration, weight loss)     moderate 2. ONSET: When did the nausea begin?     3 days  3. VOMITING: Any vomiting? If Yes, ask: How many times today?     once 4. RECURRENT SYMPTOM: Have you had nausea before? If Yes, ask: When was the last time? What happened that time?     na 5. CAUSE: What do you think is causing the nausea?     Possible medication samples of rexult  Protocols used: Nausea-A-AH

## 2023-12-07 DIAGNOSIS — I160 Hypertensive urgency: Secondary | ICD-10-CM | POA: Insufficient documentation

## 2023-12-07 DIAGNOSIS — S0990XA Unspecified injury of head, initial encounter: Secondary | ICD-10-CM | POA: Insufficient documentation

## 2023-12-07 DIAGNOSIS — R413 Other amnesia: Secondary | ICD-10-CM | POA: Insufficient documentation

## 2023-12-07 NOTE — Telephone Encounter (Signed)
Left message for Lynn to return call.

## 2023-12-07 NOTE — Assessment & Plan Note (Signed)
 Acute cognitive decline. Previous MRI indicated stroke. - Ordered blood tests: B12, folate, MMA, TSH. - Scheduled CT scan of the brain.

## 2023-12-07 NOTE — Assessment & Plan Note (Signed)
 Head injury with bruising and cognitive symptoms. Risk of intracranial injury due to mechanism and history of falls. - Ordered CT scan of the brain

## 2023-12-07 NOTE — Telephone Encounter (Signed)
 Please advice  Copied from CRM [id number]. Topic: Clinical - Medication Question >> Dec 07, 2023  9:09 AM Emylou G wrote: Reason for CRM: LYNN OWENS called Memantine  and Rexult samples .. is it okay to mix both?  Pls call her asap - she is feeling fustrated about the communication ( urine sample )  and wants to know is it necessary for her to take thyroid  based on her age.

## 2023-12-07 NOTE — Assessment & Plan Note (Addendum)
 Symptomatic blood pressure elevation with headaches and disorientation. Current regimen includes losartan , Toprol  XL, and PRN clonidine . Immediate management required to prevent complications. - Administered clonidine  to lower blood pressure. STOP LOSARTAN . START ON VALSARTAN  160/25 MG ONCE DAILY. CONTINUE METOPROLOL  XL 50 MG every day.  CONTINUE CLONIDINE  0.1 MG TWICE A DAY AS NEEDED IF SYSTOLIC BP (UPPER NUMBER) IS > 160 AND DIASTOLIC BP (LOWER NUMBER) IS >09.

## 2023-12-08 DIAGNOSIS — R531 Weakness: Secondary | ICD-10-CM | POA: Diagnosis not present

## 2023-12-08 DIAGNOSIS — S51812A Laceration without foreign body of left forearm, initial encounter: Secondary | ICD-10-CM | POA: Diagnosis not present

## 2023-12-08 DIAGNOSIS — W06XXXA Fall from bed, initial encounter: Secondary | ICD-10-CM | POA: Diagnosis not present

## 2023-12-13 ENCOUNTER — Telehealth: Payer: Self-pay

## 2023-12-13 NOTE — Telephone Encounter (Signed)
 Call [facility] and cancel all rxs.  Remove me as her provider please

## 2023-12-13 NOTE — Telephone Encounter (Signed)
 Done

## 2023-12-13 NOTE — Telephone Encounter (Signed)
 Please advice  Copied from CRM [id number]. Topic: Clinical - Medication Question >> Dec 13, 2023  9:23 AM Tobias CROME wrote: Reason for CRM: Patient's niece Macario requesting for all refills for patient's prescriptions to be stopped. Macario requesting for someone to reach out to [facility] as patient is in hospice now.

## 2023-12-19 ENCOUNTER — Ambulatory Visit: Admitting: Family Medicine

## 2023-12-20 DEATH — deceased

## 2024-01-02 ENCOUNTER — Other Ambulatory Visit: Payer: Self-pay | Admitting: Family Medicine

## 2024-01-04 ENCOUNTER — Ambulatory Visit: Admitting: Family Medicine

## 2024-02-23 ENCOUNTER — Other Ambulatory Visit: Payer: Self-pay | Admitting: Family Medicine

## 2024-02-23 DIAGNOSIS — I160 Hypertensive urgency: Secondary | ICD-10-CM
